# Patient Record
Sex: Female | Born: 1975
Health system: Southern US, Community
[De-identification: ages and names within clinical notes are randomized; demographics above are authoritative.]

## PROBLEM LIST (undated history)

## (undated) DIAGNOSIS — F319 Bipolar disorder, unspecified: Secondary | ICD-10-CM

## (undated) DIAGNOSIS — F411 Generalized anxiety disorder: Secondary | ICD-10-CM

## (undated) DIAGNOSIS — I10 Essential (primary) hypertension: Secondary | ICD-10-CM

## (undated) DIAGNOSIS — F332 Major depressive disorder, recurrent severe without psychotic features: Secondary | ICD-10-CM

## (undated) HISTORY — PX: DILATION AND CURETTAGE OF UTERUS: SHX78

## (undated) HISTORY — DX: Essential (primary) hypertension: I10

---
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
## 2011-09-12 ENCOUNTER — Emergency Department (HOSPITAL_COMMUNITY): Payer: Self-pay

## 2011-09-12 ENCOUNTER — Encounter (HOSPITAL_COMMUNITY): Payer: Self-pay | Admitting: *Deleted

## 2011-09-12 ENCOUNTER — Emergency Department (HOSPITAL_COMMUNITY)
Admission: EM | Admit: 2011-09-12 | Discharge: 2011-09-12 | Disposition: A | Discharge status: Home / Self Care | Payer: Self-pay | Attending: Emergency Medicine | Admitting: Emergency Medicine

## 2011-09-12 DIAGNOSIS — R0602 Shortness of breath: Secondary | ICD-10-CM | POA: Insufficient documentation

## 2011-09-12 DIAGNOSIS — R079 Chest pain, unspecified: Secondary | ICD-10-CM | POA: Insufficient documentation

## 2011-09-12 DIAGNOSIS — R05 Cough: Secondary | ICD-10-CM

## 2011-09-12 DIAGNOSIS — R059 Cough, unspecified: Secondary | ICD-10-CM | POA: Insufficient documentation

## 2011-09-12 MED ORDER — DIPHENHYDRAMINE HCL 25 MG PO CAPS
25.0000 mg | ORAL_CAPSULE | Freq: Once | ORAL | Status: AC
  Administered 2011-09-12: 25 mg via ORAL
  Filled 2011-09-12: qty 1

## 2011-09-12 MED ORDER — ALBUTEROL SULFATE (5 MG/ML) 0.5% IN NEBU
2.5000 mg | INHALATION_SOLUTION | Freq: Once | RESPIRATORY_TRACT | Status: AC
  Administered 2011-09-12: 2.5 mg via RESPIRATORY_TRACT
  Filled 2011-09-12: qty 0.5

## 2011-09-12 NOTE — ED Provider Notes (Signed)
History     CSN: [id number]  Arrival date & time 09/12/11  1343   First MD Initiated Contact with Patient 09/12/11 1356      Chief Complaint  Patient presents with  . Shortness of Breath  . Cough    (Consider location/radiation/quality/duration/timing/severity/associated sxs/prior treatment) HPI Comments: Brandy Vega is a 36 y.o. female who presents to the Emergency Department complaining of cough with yellow sputum and associated shortness of breath and chest pain that began last night. Denies fever, chills, nausea, vomiting, diarrhea.   No PCP  Patient is a 36 y.o. female presenting with shortness of breath and cough.  Shortness of Breath  Associated symptoms include cough and shortness of breath.  Cough Associated symptoms include shortness of breath.    No past medical history on file.  Past Surgical History  Procedure Date  . Dilation and curettage of uterus     No family history on file.  History  Substance Use Topics  . Smoking status: Current Everyday Smoker -- 0.5 packs/day    Types: Cigarettes  . Smokeless tobacco: Not on file  . Alcohol Use: Yes     occ. use    OB History    Grav Para Term Preterm Abortions TAB SAB Ect Mult Living                  Review of Systems  Respiratory: Positive for cough and shortness of breath.    10 Systems reviewed and are negative for acute change except as noted in the HPI. Allergies  Review of patient's allergies indicates no known allergies.  Home Medications  No current outpatient prescriptions on file.  BP 125/95  Pulse 97  Temp(Src) 98.3 F (36.8 C) (Oral)  Resp 24  Ht 5' 5.5" (1.664 m)  Wt 110 lb (49.896 kg)  BMI 18.03 kg/m2  SpO2 100%  LMP 09/05/2011  Physical Exam  Nursing note and vitals reviewed. Constitutional: She is oriented to person, place, and time. She appears well-developed and well-nourished.  HENT:  Head: Normocephalic and atraumatic.  Eyes: Conjunctivae and EOM are normal.  Pupils are equal, round, and reactive to light.  Neck: Normal range of motion. Neck supple.  Cardiovascular: Normal rate, normal heart sounds and intact distal pulses.   Pulmonary/Chest: Effort normal and breath sounds normal.       cough  Abdominal: Soft.  Musculoskeletal: Normal range of motion.  Neurological: She is alert and oriented to person, place, and time. She has normal reflexes.  Skin: Skin is dry.    ED Course  Procedures (including critical care time)  Labs Reviewed - No data to display Dg Chest 2 View  09/12/2011  *RADIOLOGY REPORT*  Clinical Data: Shortness of breath.  Cough.  CHEST - 2 VIEW  Comparison: None.  Findings: Heart size and vascularity are normal and the lungs are clear.  No effusions.  No osseous abnormality.  IMPRESSION: Normal chest.  Original Report Authenticated By: Gwynn Burly, M.D.     MDM  Patient with cough, shortness of breath and associated chest discomfort that began yesterday. Chest xray without acute findings. Given nebulizer treatment with relief.  Pt stable in ED with no significant deterioration in condition.The patient appears reasonably screened and/or stabilized for discharge and I doubt any other medical condition or other [facility] requiring further screening, evaluation, or treatment in the ED at this time prior to discharge.  MDM Reviewed: nursing note and vitals Interpretation: x-ray  Nicoletta Dress. Colon Branch, MD 09/12/11 [phone]

## 2011-09-12 NOTE — ED Notes (Signed)
Cough productive of clear/yellow phlegm since last night with sob per pt

## 2014-08-07 ENCOUNTER — Encounter (HOSPITAL_COMMUNITY): Payer: Self-pay | Admitting: *Deleted

## 2014-08-07 ENCOUNTER — Emergency Department (HOSPITAL_COMMUNITY)
Admission: EM | Admit: 2014-08-07 | Discharge: 2014-08-07 | Disposition: A | Discharge status: Home / Self Care | Payer: Self-pay | Attending: Emergency Medicine | Admitting: Emergency Medicine

## 2014-08-07 DIAGNOSIS — S81812A Laceration without foreign body, left lower leg, initial encounter: Secondary | ICD-10-CM

## 2014-08-07 DIAGNOSIS — Y9289 Other specified places as the place of occurrence of the external cause: Secondary | ICD-10-CM | POA: Insufficient documentation

## 2014-08-07 DIAGNOSIS — Y9389 Activity, other specified: Secondary | ICD-10-CM | POA: Insufficient documentation

## 2014-08-07 DIAGNOSIS — Z720 Tobacco use: Secondary | ICD-10-CM | POA: Insufficient documentation

## 2014-08-07 DIAGNOSIS — S71112A Laceration without foreign body, left thigh, initial encounter: Secondary | ICD-10-CM | POA: Insufficient documentation

## 2014-08-07 DIAGNOSIS — Y998 Other external cause status: Secondary | ICD-10-CM | POA: Insufficient documentation

## 2014-08-07 MED ORDER — LIDOCAINE HCL (PF) 1 % IJ SOLN
INTRAMUSCULAR | Status: AC
  Filled 2014-08-07: qty 5

## 2014-08-07 NOTE — ED Notes (Signed)
Brandy Plateravis, MD to bedside requesting lidocaine. On return to room,pt alone in room standing up again, trying to put her clothes on saying she is leaving. Continuing to reassure pt while awaiting EDP

## 2014-08-07 NOTE — ED Notes (Signed)
Pt admits to ETOH tonight "a lot" uncooperative with staff.

## 2014-08-07 NOTE — Discharge Instructions (Signed)
Get your staples taken out in one week, usually go to any urgent care, or emergency department  Stitches, Staples, or Skin Adhesive Strips  Stitches (sutures), staples, and skin adhesive strips hold the skin together as it heals. They will usually be in place for 7 days or less. HOME CARE  Wash your hands with soap and water before and after you touch your wound.  Only take medicine as told by your doctor.  Cover your wound only if your doctor told you to. Otherwise, leave it open to air.  Do not get your stitches wet or dirty. If they get dirty, dab them gently with a clean washcloth. Wet the washcloth with soapy water. Do not rub. Pat them dry gently.  Do not put medicine or medicated cream on your stitches unless your doctor told you to.  Do not take out your own stitches or staples. Skin adhesive strips will fall off by themselves.  Do not pick at the wound. Picking can cause an infection.  Do not miss your follow-up appointment.  If you have problems or questions, call your doctor. GET HELP RIGHT AWAY IF:   You have a temperature by mouth above 102 F (38.9 C), not controlled by medicine.  You have chills.  You have redness or pain around your stitches.  There is puffiness (swelling) around your stitches.  You notice fluid (drainage) from your stitches.  There is a bad smell coming from your wound. MAKE SURE YOU:  Understand these instructions.  Will watch your condition.  Will get help if you are not doing well or get worse. Document Released: 06/11/2009 Document Revised: 11/06/2011 Document Reviewed: 06/11/2009 [facility] Patient Information 2015 [employer] [place]. This information is not intended to replace advice given to you by your health care provider. Make sure you discuss any questions you have with your health care provider.

## 2014-08-07 NOTE — ED Notes (Signed)
Pt standing up, getting out of bed, saying she is leaving. Pt taking off monitoring equipment. Earlene Plateravis, MD called on phone and notified pt trying to leave. Reassuring pt
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
## 2014-08-07 NOTE — ED Notes (Signed)
After closure of wound, EDP remained at bedside with pt to discuss plan of care, including follow up information with pt. Pt says she is not happy because she wanted sutures and not staples, EDP offered to suture patient and remove staples, pt refused.  Pt agreed to have bandage placed.  Pt continues to be uncooperative with him and this rn. Pt says she is leaving, continuing to get dressed. EDP agrees pt will having to sign out AMA. Pt refuses to sign AMA form, charge nurse aware of the same. Pt ambulatory to discharge lobby.
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
## 2014-08-07 NOTE — ED Notes (Signed)
Confirmed with dr. Radford PaxBeaton no labs needed at this time.

## 2014-08-07 NOTE — ED Notes (Signed)
Earlene Plateravis, at bedside for staple application

## 2017-04-13 ENCOUNTER — Encounter: Payer: Self-pay | Admitting: Family Medicine

## 2017-04-13 ENCOUNTER — Ambulatory Visit (INDEPENDENT_AMBULATORY_CARE_PROVIDER_SITE_OTHER): Payer: Worker's Compensation | Admitting: Family Medicine

## 2017-04-13 VITALS — BP 130/89 | HR 83 | Temp 99.3°F | Ht 64.0 in | Wt 115.0 lb

## 2017-04-13 DIAGNOSIS — S63501A Unspecified sprain of right wrist, initial encounter: Secondary | ICD-10-CM | POA: Diagnosis not present

## 2017-04-13 NOTE — Progress Notes (Signed)
Subjective: CC: Work injury Date of injury: 04/06/17 Employer: [employer] PCP: Raliegh Ip, DO ZOX:[id number] Brandy Vega is a 41 y.o. female presenting to clinic today for:  1. Right wrist sprain Patient presents for recheck of her right wrist sprain for which she was initially seen at urgent care in [place] near [place]. She reports that she had a bundle of copper wire fall on the dorsum of her right wrist on 04/06/2017. She notes that at that time pain was a 10 out of 10. She had an x-ray performed at the urgent care center that was negative for any evidence of fracture. She was discharged with a wrist splint, meloxicam, and a muscle relaxer. She notes she used the wrist splint for about 3-4 days as directed. She reports that her pain has improved significantly and is a 3 out of 10 today. She notes she has slight discomfort with over pronation of the right wrist. Otherwise, she is pain-free. She reports she initially was unable to move her hand normally and that she felt like her hand had been stunned. Today she denies swelling, ecchymosis, erythema, crepitus, numbness or tingling.   She works at [employer]. 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 This involves a lot of upper extremity, hand, and wrist movement. She feels that she is able to return to work. She notes that she does not have to return until Monday.  No Known Allergies History reviewed. No pertinent past medical history. Family History  Problem Relation Age of Onset  . Hypertension Mother   . Diabetes Father    Social Hx: Active every day smoker.Current medications reviewed.   ROS: Per HPI  Objective: Office vital signs reviewed. BP 130/89   Pulse 83   Temp 99.3 F (37.4 C) (Oral)   Ht 5\' 4"  (1.626 m)   Wt 115 lb (52.2 kg)   BMI 19.74 kg/m   Physical Examination:  General: Awake, alert, well nourished, No acute distress Extremities: warm, well perfused, No edema, cyanosis or clubbing; +2 pulses  bilaterally. She has brisk capillary refill in all fingers.  Left wrist: No ecchymosis, no erythema. Skin is intact. She has full active range of motion. 5/5 strength.  Right wrist: No ecchymosis, no erythema. Skin is intact. No palpable bony abnormalities. She has about a 5% decrease in wrist flexion. She also has slight discomfort with over pronation of the right forearm. Otherwise she has full active, painless range of motion. No tenderness to palpation of the entire wrist and hand. She has 5/5 wrist strength. Skin: dry; intact; no rashes or lesions. Neuro: light touch sensation grossly intact, no focal neurologic deficits.  Assessment/ Plan: 41 y.o. female   Right wrist sprain, initial encounter Unfortunately, we were not able to obtain the records from the urgent care where she was seen initially. We're awaiting these records. However, right wrist pain improved. She continues to have about a 5% loss of range of motion in flexion and slight discomfort with over pronation of the right wrist. Otherwise she has full active range of motion in her wrist. There is no soft tissue swelling or skin changes. There are no bony abnormalities on exam. She has been cleared to return to work. No restrictions. We discussed that should her pain return or she develops swelling of that wrist, she should resume use of rigid wrist brace at nighttime and use a soft wrist brace during activity. She can continue her NSAID as needed as directed. I advised that she  avoid using the muscle relaxer unless she is expecting to stay home and sleep. Her worker's comp form has been completed and returned to her. Patient voiced good understanding of home care instructions and return precautions.  Follow-up with primary care provider as needed, as scheduled for routine care.  Raliegh Ip, DO [facility] [phone]

## 2017-04-13 NOTE — Patient Instructions (Signed)
You wrist looks has improved.  You can resume normal activities.  I recommend that you use your wrist brace, apply ice and take your antiinflammatory medication (Naproxen, Ibuprofen, Diclofenac, OR Meloxicam) if it becomes achy after work.  I suspect that you will recover well without these though.     Wrist Pain, Adult There are many things that can cause wrist pain. Some common causes include:  An injury to the wrist area.  Overuse of the joint.  A condition that causes too much pressure to be put on a nerve in the wrist (carpal tunnel syndrome).  Wear and tear of the joints that happens as a person gets older (osteoarthritis).  Other types of arthritis.  Sometimes, the cause of wrist pain is not known. Often, the pain goes away when you follow your doctor's instructions for helping pain at home, such as resting or icing your wrist. If your wrist pain does not go away, it is important to tell your doctor. Follow these instructions at home:  Rest the wrist area for 48 hours or more, or as long as told by your doctor.  If a splint or elastic bandage has been put on your wrist, use it as told by your doctor. ? Take off the splint or bandage only as told by your doctor. ? Loosen the splint or bandage if your fingers tingle, lose feeling (get numb), or turn cold or blue.  If directed, apply ice to the injured area: ? If you have a removable splint or elastic bandage, remove it as told by your doctor. ? Put ice in a plastic bag. ? Place a towel between your skin and the bag or between your splint or bandage and the bag. ? Leave the ice on for 20 minutes, 2-3 times a day.  Keep your arm raised (elevated) above the level of your heart while you are sitting or lying down.  Take over-the-counter and prescription medicines only as told by your doctor.  Keep all follow-up visits as told by your doctor. This is important. Contact a doctor if:  You have a sudden sharp pain in the wrist,  hand, or arm that is different or new.  The swelling or bruising on your wrist or hand gets worse.  Your skin becomes red, gets a rash, or has open sores.  Your pain does not get better or it gets worse. Get help right away if:  You lose feeling in your fingers or hand.  Your fingers turn white, very red, or cold and blue.  You cannot move your fingers.  You have a fever or chills. This information is not intended to replace advice given to you by your health care provider. Make sure you discuss any questions you have with your health care provider. Document Released: 01/31/2008 Document Revised: 03/09/2016 Document Reviewed: 03/02/2016 Elsevier Interactive Patient Education  2017 [employer].

## 2018-03-07 ENCOUNTER — Encounter: Payer: Self-pay | Admitting: Family Medicine

## 2018-03-07 ENCOUNTER — Ambulatory Visit (INDEPENDENT_AMBULATORY_CARE_PROVIDER_SITE_OTHER): Payer: BLUE CROSS/BLUE SHIELD | Admitting: Family Medicine

## 2018-03-07 VITALS — BP 151/94 | HR 78 | Temp 97.6°F | Ht 65.0 in | Wt 113.2 lb

## 2018-03-07 DIAGNOSIS — F339 Major depressive disorder, recurrent, unspecified: Secondary | ICD-10-CM | POA: Diagnosis not present

## 2018-03-07 MED ORDER — CITALOPRAM HYDROBROMIDE 20 MG PO TABS: 20.0000 mg | ORAL_TABLET | Freq: Every day | ORAL | 3 refills | Status: DC

## 2018-03-07 NOTE — Patient Instructions (Signed)
Great to meet you!  Start citalopram 1 pill once daily. Come back in 2 weeks to see myself or Dr. Nadine CountsGottschalk.   Call Behavioral health for an appointment today.   Your provider wants you to schedule an appointment with a Psychologist/Psychiatrist. The following list of offices requires the patient to call and make their own appointment, as there is information they need that only you can provide. Please feel free to choose form the following providers:  [facility]   [phone] [place] [phone]  [facility]  [phone] [address] [place], [place]

## 2018-03-07 NOTE — Progress Notes (Signed)
   HPI  Patient presents today depression.  Patient states that she is struggled with depression for the last year or so. She states that about a year ago she had suicidal thoughts and was considering attempting suicide so she called a suicide hotline.  She states that her mother is very involved and she is been talking to her lately.  Patient has had thoughts lately of being better off dead and wishing that she could go to bed and not wake up.  Patient states that she does not want to hurt her self currently and is not planning to hurt herself.  She contracts for safety stating that she will call 911 suicide hotline, or her mother if she has intent to hurt herself.  PMH: None Past surgical history: D&C Family medical history: Brother with schizophrenia Social history: Current smoker ROS: Per HPI  Objective: BP (!) 151/94   Pulse 78   Temp 97.6 F (36.4 C) (Oral)   Ht 5\' 5"  (1.651 m)   Wt 113 lb 3.2 oz (51.3 kg)   BMI 18.84 kg/m  Gen: NAD, alert, cooperative with exam HEENT: NCAT CV: RRR, good S1/S2, no murmur Resp: CTABL, no wheezes, non-labored Ext: No edema, warm Neuro: Alert and oriented, No gross deficits  Dep[facility] screen [facility] 03/07/2018 04/13/2017  Decreased Interest 3 0  Down, Depressed, Hopeless 3 0  PHQ - 2 Score 6 0  Altered sleeping 0 -  Tired, decreased energy 3 -  Change in appetite 2 -  Feeling bad or failure about yourself  0 -  Trouble concentrating 3 -  Moving slowly or fidgety/restless 3 -  Suicidal thoughts 2 -  PHQ-9 Score 19 -     Assessment and plan:  #Depression Severe, persistent over the last year. Start citalopram today Patient with suicidal thoughts but denies any suicidal plans.  She has never had a [place] attempt and has called the suicide hotline when she considered suicide about a year ago.  She readily contracts for safety when discussed I emphasized the importance of establishing care with psychiatry We discussed possible side  effects with citalopram, also discussed appropriate expectations for this medication Follow-up 2 weeks    Orders Placed This Encounter  Procedures  . Ambulatory referral to Psychiatry    Referral Priority:   Routine    Referral Type:   Psychiatric    Referral Reason:   Specialty Services Required    Requested Specialty:   Psychiatry    Number of Visits Requested:   1    Meds ordered this encounter  Medications  . citalopram (CELEXA) 20 MG tablet    Sig: Take 1 tablet (20 mg total) by mouth daily.    Dispense:  30 tablet    Refill:  3    Murtis SinkSam Kamariyah Timberlake, MD [facility] 03/07/2018, 11:50 AM

## 2018-03-08 ENCOUNTER — Telehealth (HOSPITAL_COMMUNITY): Payer: Self-pay

## 2018-03-08 ENCOUNTER — Telehealth: Payer: Self-pay

## 2018-03-08 NOTE — Telephone Encounter (Signed)
[facility] - Left Msg on the emergency contact number listed in epic.  Patient voice mail is not set up.

## 2018-03-11 DIAGNOSIS — Z0289 Encounter for other administrative examinations: Secondary | ICD-10-CM

## 2018-03-17 ENCOUNTER — Other Ambulatory Visit: Payer: Self-pay

## 2018-03-17 ENCOUNTER — Encounter (HOSPITAL_COMMUNITY): Payer: Self-pay | Admitting: Emergency Medicine

## 2018-03-17 ENCOUNTER — Emergency Department (HOSPITAL_COMMUNITY)
Admission: EM | Admit: 2018-03-17 | Discharge: 2018-03-18 | Disposition: A | Discharge status: Home / Self Care | Payer: BLUE CROSS/BLUE SHIELD | Attending: Emergency Medicine | Admitting: Emergency Medicine

## 2018-03-17 DIAGNOSIS — F17210 Nicotine dependence, cigarettes, uncomplicated: Secondary | ICD-10-CM | POA: Insufficient documentation

## 2018-03-17 DIAGNOSIS — F329 Major depressive disorder, single episode, unspecified: Secondary | ICD-10-CM | POA: Diagnosis present

## 2018-03-17 DIAGNOSIS — R45851 Suicidal ideations: Secondary | ICD-10-CM | POA: Diagnosis not present

## 2018-03-17 DIAGNOSIS — F1010 Alcohol abuse, uncomplicated: Secondary | ICD-10-CM | POA: Insufficient documentation

## 2018-03-17 DIAGNOSIS — Z79899 Other long term (current) drug therapy: Secondary | ICD-10-CM | POA: Diagnosis not present

## 2018-03-17 DIAGNOSIS — F332 Major depressive disorder, recurrent severe without psychotic features: Secondary | ICD-10-CM

## 2018-03-17 HISTORY — DX: Major depressive disorder, recurrent severe without psychotic features: F33.2

## 2018-03-17 LAB — COMPREHENSIVE METABOLIC PANEL
ALT: 15 U/L (ref 0–44)
AST: 30 U/L (ref 15–41)
Albumin: 4.4 g/dL (ref 3.5–5.0)
Alkaline Phosphatase: 56 U/L (ref 38–126)
Anion gap: 12 (ref 5–15)
BILIRUBIN TOTAL: 0.4 mg/dL (ref 0.3–1.2)
BUN: 7 mg/dL (ref 6–20)
CO2: 25 mmol/L (ref 22–32)
CREATININE: 0.68 mg/dL (ref 0.44–1.00)
Calcium: 8.5 mg/dL — ABNORMAL LOW (ref 8.9–10.3)
Chloride: 108 mmol/L (ref 98–111)
GLUCOSE: 86 mg/dL (ref 70–99)
Potassium: 3.3 mmol/L — ABNORMAL LOW (ref 3.5–5.1)
SODIUM: 145 mmol/L (ref 135–145)
Total Protein: 7.8 g/dL (ref 6.5–8.1)

## 2018-03-17 LAB — CBC
HEMATOCRIT: 39.9 % (ref 36.0–46.0)
HEMOGLOBIN: 13.6 g/dL (ref 12.0–15.0)
MCH: 30.2 pg (ref 26.0–34.0)
MCHC: 34.1 g/dL (ref 30.0–36.0)
MCV: 88.5 fL (ref 78.0–100.0)
Platelets: 217 10*3/uL (ref 150–400)
RBC: 4.51 MIL/uL (ref 3.87–5.11)
RDW: 13.3 % (ref 11.5–15.5)
WBC: 6.1 10*3/uL (ref 4.0–10.5)

## 2018-03-17 LAB — I-STAT BETA HCG BLOOD, ED (MC, WL, AP ONLY): I-stat hCG, quantitative: <5 m[IU]/mL (ref <5)

## 2018-03-17 LAB — RAPID URINE DRUG SCREEN, HOSP PERFORMED
AMPHETAMINES: NOT DETECTED
Benzodiazepines: NOT DETECTED
Cocaine: NOT DETECTED
OPIATES: NOT DETECTED
TETRAHYDROCANNABINOL: NOT DETECTED

## 2018-03-17 LAB — SALICYLATE LEVEL

## 2018-03-17 LAB — ACETAMINOPHEN LEVEL: Acetaminophen (Tylenol), Serum: <10 ug/mL — ABNORMAL LOW (ref 10–30)

## 2018-03-17 LAB — ETHANOL: Alcohol, Ethyl (B): 371 mg/dL (ref <10)

## 2018-03-17 MED ORDER — ACETAMINOPHEN 325 MG PO TABS: 650.0000 mg | ORAL_TABLET | ORAL | Status: DC | PRN

## 2018-03-17 MED ORDER — ZOLPIDEM TARTRATE 5 MG PO TABS: 5.0000 mg | ORAL_TABLET | Freq: Every evening | ORAL | Status: DC | PRN

## 2018-03-17 MED ORDER — VITAMIN B-1 100 MG PO TABS: 100.0000 mg | ORAL_TABLET | Freq: Every day | ORAL | Status: DC

## 2018-03-17 MED ORDER — LORAZEPAM 1 MG PO TABS: 0.0000 mg | ORAL_TABLET | Freq: Two times a day (BID) | ORAL | Status: DC

## 2018-03-17 MED ORDER — ONDANSETRON HCL 4 MG PO TABS: 4.0000 mg | ORAL_TABLET | Freq: Three times a day (TID) | ORAL | Status: DC | PRN

## 2018-03-17 MED ORDER — LORAZEPAM 2 MG/ML IJ SOLN: 0.0000 mg | Freq: Four times a day (QID) | INTRAMUSCULAR | Status: DC

## 2018-03-17 MED ORDER — LORAZEPAM 2 MG/ML IJ SOLN: 0.0000 mg | Freq: Two times a day (BID) | INTRAMUSCULAR | Status: DC

## 2018-03-17 MED ORDER — LORAZEPAM 1 MG PO TABS: 0.0000 mg | ORAL_TABLET | Freq: Four times a day (QID) | ORAL | Status: DC

## 2018-03-17 MED ORDER — THIAMINE HCL 100 MG/ML IJ SOLN: 100.0000 mg | Freq: Every day | INTRAMUSCULAR | Status: DC

## 2018-03-17 NOTE — ED Notes (Signed)
When lab went to draw pt's blood, pt began having jerking of body and staring to the left.  After blood obtained, shaking stopped and pt began talking with no postictal phase.  IV initiated for precautions and pt again began jerking in the same manner after saying "Aww lawd leave me the hell alone."  After IV started, pt stopped and continued to converse in a rambling manner.  No further jerking and no s/s of seizure.
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
## 2018-03-17 NOTE — ED Notes (Signed)
Attempt call for follow up to Ms. Brandy Vega, [facility].
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
## 2018-03-17 NOTE — ED Notes (Signed)
Date and time results received: 03/17/18 1610 (use smartphrase ".now" to insert current time)  Test: alcohol ethyl Critical Value: 371  Name of Provider Notified: Dr Rhunette CroftNanavati  Orders Received? Or Actions Taken?: none at this time

## 2018-03-18 ENCOUNTER — Inpatient Hospital Stay (HOSPITAL_COMMUNITY)
Admission: AD | Admit: 2018-03-18 | Discharge: 2018-03-22 | DRG: 885 | Disposition: A | Discharge status: Home / Self Care | Payer: BLUE CROSS/BLUE SHIELD | Source: Intra-hospital | Attending: Psychiatry | Admitting: Psychiatry

## 2018-03-18 ENCOUNTER — Encounter (HOSPITAL_COMMUNITY): Payer: Self-pay | Admitting: Psychiatry

## 2018-03-18 DIAGNOSIS — F1020 Alcohol dependence, uncomplicated: Secondary | ICD-10-CM | POA: Diagnosis present

## 2018-03-18 DIAGNOSIS — F17210 Nicotine dependence, cigarettes, uncomplicated: Secondary | ICD-10-CM | POA: Diagnosis present

## 2018-03-18 DIAGNOSIS — Z79899 Other long term (current) drug therapy: Secondary | ICD-10-CM

## 2018-03-18 DIAGNOSIS — F4310 Post-traumatic stress disorder, unspecified: Secondary | ICD-10-CM | POA: Diagnosis present

## 2018-03-18 DIAGNOSIS — F332 Major depressive disorder, recurrent severe without psychotic features: Secondary | ICD-10-CM | POA: Diagnosis present

## 2018-03-18 DIAGNOSIS — G4700 Insomnia, unspecified: Secondary | ICD-10-CM | POA: Diagnosis present

## 2018-03-18 DIAGNOSIS — R45851 Suicidal ideations: Secondary | ICD-10-CM | POA: Diagnosis present

## 2018-03-18 DIAGNOSIS — F419 Anxiety disorder, unspecified: Secondary | ICD-10-CM | POA: Diagnosis not present

## 2018-03-18 DIAGNOSIS — F411 Generalized anxiety disorder: Secondary | ICD-10-CM | POA: Diagnosis present

## 2018-03-18 DIAGNOSIS — Y908 Blood alcohol level of 240 mg/100 ml or more: Secondary | ICD-10-CM | POA: Diagnosis present

## 2018-03-18 DIAGNOSIS — F1011 Alcohol abuse, in remission: Secondary | ICD-10-CM

## 2018-03-18 HISTORY — DX: Major depressive disorder, recurrent severe without psychotic features: F33.2

## 2018-03-18 MED ORDER — LOPERAMIDE HCL 2 MG PO CAPS: 2.0000 mg | ORAL_CAPSULE | ORAL | Status: AC | PRN

## 2018-03-18 MED ORDER — ONDANSETRON 4 MG PO TBDP: 4.0000 mg | ORAL_TABLET | Freq: Four times a day (QID) | ORAL | Status: AC | PRN

## 2018-03-18 MED ORDER — ADULT MULTIVITAMIN W/MINERALS CH
1.0000 | ORAL_TABLET | Freq: Every day | ORAL | Status: DC
  Administered 2018-03-19 – 2018-03-22 (×4): 1 via ORAL
  Filled 2018-03-18 (×6): qty 1

## 2018-03-18 MED ORDER — POTASSIUM CHLORIDE CRYS ER 20 MEQ PO TBCR
20.0000 meq | EXTENDED_RELEASE_TABLET | Freq: Once | ORAL | Status: AC
  Administered 2018-03-18: 20 meq via ORAL
  Filled 2018-03-18 (×2): qty 1

## 2018-03-18 MED ORDER — CHLORDIAZEPOXIDE HCL 25 MG PO CAPS: 25.0000 mg | ORAL_CAPSULE | Freq: Every day | ORAL | Status: DC

## 2018-03-18 MED ORDER — CHLORDIAZEPOXIDE HCL 25 MG PO CAPS: 25.0000 mg | ORAL_CAPSULE | Freq: Four times a day (QID) | ORAL | Status: AC | PRN

## 2018-03-18 MED ORDER — CHLORDIAZEPOXIDE HCL 25 MG PO CAPS
25.0000 mg | ORAL_CAPSULE | Freq: Four times a day (QID) | ORAL | Status: AC
  Administered 2018-03-18 – 2018-03-20 (×6): 25 mg via ORAL
  Filled 2018-03-18 (×6): qty 1

## 2018-03-18 MED ORDER — CHLORDIAZEPOXIDE HCL 25 MG PO CAPS
25.0000 mg | ORAL_CAPSULE | ORAL | Status: AC
Start: 2018-03-21 — End: 2018-03-22
  Administered 2018-03-21 – 2018-03-22 (×2): 25 mg via ORAL
  Filled 2018-03-18 (×2): qty 1

## 2018-03-18 MED ORDER — VITAMIN B-1 100 MG PO TABS
100.0000 mg | ORAL_TABLET | Freq: Every day | ORAL | Status: DC
  Administered 2018-03-19 – 2018-03-22 (×4): 100 mg via ORAL
  Filled 2018-03-18 (×6): qty 1

## 2018-03-18 MED ORDER — MAGNESIUM HYDROXIDE 400 MG/5ML PO SUSP: 30.0000 mL | Freq: Every day | ORAL | Status: DC | PRN

## 2018-03-18 MED ORDER — HYDROXYZINE HCL 25 MG PO TABS: 25.0000 mg | ORAL_TABLET | Freq: Four times a day (QID) | ORAL | Status: DC | PRN

## 2018-03-18 MED ORDER — TRAZODONE HCL 50 MG PO TABS: 50.0000 mg | ORAL_TABLET | Freq: Every evening | ORAL | Status: DC | PRN

## 2018-03-18 MED ORDER — CHLORDIAZEPOXIDE HCL 25 MG PO CAPS
25.0000 mg | ORAL_CAPSULE | Freq: Three times a day (TID) | ORAL | Status: AC
Start: 2018-03-20 — End: 2018-03-21
  Administered 2018-03-20 – 2018-03-21 (×3): 25 mg via ORAL
  Filled 2018-03-18 (×3): qty 1

## 2018-03-18 MED ORDER — ALUM & MAG HYDROXIDE-SIMETH 200-200-20 MG/5ML PO SUSP: 30.0000 mL | ORAL | Status: DC | PRN

## 2018-03-18 NOTE — Progress Notes (Signed)
[facility]2 Fransisca KaufmannLaura Davis, NP is the accepting provider.  Dr.Gregory Jola BabinskiClary is the attending provider.  Call report to [phone]  Robin@ [facility] notified for pt's Nurse Arlys JohnBrian.   Pt is Voluntary.  Pt may be transported by [facility]  Pt scheduled  [facility] as soon as transport can be arranged.  Timmothy EulerJean T. Kaylyn LimSutter, MSW, LCSWA Disposition Clinical Social Work [phone] (cell) [phone] (office)

## 2018-03-18 NOTE — ED Notes (Signed)
[facility] Accepting is Fransisca KaufmannLaura Davis, NP Attending is Dr Doralee AlbinoGregory  Clary Call report [phone] Fax signed voluntary consent to [phone] Can come anytime
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
## 2018-03-19 ENCOUNTER — Encounter (HOSPITAL_COMMUNITY): Payer: Self-pay

## 2018-03-19 ENCOUNTER — Other Ambulatory Visit: Payer: Self-pay

## 2018-03-19 DIAGNOSIS — F332 Major depressive disorder, recurrent severe without psychotic features: Principal | ICD-10-CM

## 2018-03-19 DIAGNOSIS — F1020 Alcohol dependence, uncomplicated: Secondary | ICD-10-CM

## 2018-03-19 DIAGNOSIS — F1011 Alcohol abuse, in remission: Secondary | ICD-10-CM

## 2018-03-19 MED ORDER — CITALOPRAM HYDROBROMIDE 20 MG PO TABS: 20.0000 mg | ORAL_TABLET | Freq: Every day | ORAL | Status: DC

## 2018-03-19 MED ORDER — CITALOPRAM HYDROBROMIDE 20 MG PO TABS
20.0000 mg | ORAL_TABLET | Freq: Every day | ORAL | Status: DC
  Administered 2018-03-19 – 2018-03-22 (×4): 20 mg via ORAL
  Filled 2018-03-19 (×7): qty 1
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
## 2018-03-19 NOTE — Progress Notes (Signed)
D. Pt presents with a sad affect congruent with mood- calm and cooperative behavior. Pt observed interacting appropriately with peers in milieu and attending group. Per pt's self inventory, pt rates her depression, hopelessness and anxiety a 6/2/2, respectively. Pt writes that her most important goal to work on today is "depression". Pt currently denies SI/HI and AV hallucinations. A. Labs and vitals monitored. Pt compliant with medications. Pt supported emotionally and encouraged to express concerns and ask questions.   R. Pt remains safe with 15 minute checks. Will continue POC.

## 2018-03-19 NOTE — Progress Notes (Signed)
Pt attended AA group this evening.  
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
## 2018-03-19 NOTE — Progress Notes (Signed)
D: Patient observed in dayroom, social with peers. Playing games, interacting, working on puzzle. Patient states she has had a good day. Has minimal withdrawal - slight tremors - and CIWA is a "2". VSS. Patient's affect anxious with depressed and anxious mood.  Denies pain.   A: Medicated per orders, no prns requested or required. Medication education provided. Level III obs in place for safety. Emotional support offered. Patient encouraged to complete Suicide Safety Plan before discharge. Encouraged to attend and participate in unit programming.   R: Patient verbalizes understanding of POC. Patient denies SI/HI/AVH and remains safe on level III obs. Will continue to monitor throughout the night.

## 2018-03-19 NOTE — Plan of Care (Signed)
Patient engaging with peers in dayroom. Working on puzzles, playing games together.  Patient verbalizes understanding of information, education provided.
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
## 2018-03-19 NOTE — Progress Notes (Signed)
ADMISSION NOTE Pt is 42 yr old female who presents with depression and ETOH abuse with a BAL of 371.  Pt is depressed, sad and wanting help with substance abuse.  Pt sts on admission she was SI, but sts she is not thinking about it now.  Pt is very poor historian and forwards little and does not give clear answers.  Pt report given to previous RN.  Pt currently contracts for safety, verbally, wants to go to sleep and be left alone.  Notes on patient previous to admission at poor. Pt to room, report given. Pt in room sleeping.  Pt remains safe on unit

## 2018-03-19 NOTE — Progress Notes (Signed)
Adult Psychoeducational Group Note  Date:  03/19/2018 Time:  10:20 AM  Group Topic/Focus:  Goals Group:   The focus of this group is to help patients establish daily goals to achieve during treatment and discuss how the patient can incorporate goal setting into their daily lives to aide in recovery.  Participation Level:  Active  Participation Quality:  Appropriate  Affect:  Appropriate  Cognitive:  Appropriate  Insight: Appropriate  Engagement in Group:  Engaged  Modes of Intervention:  Activity and Discussion  Additional Comments:  Pt attended orientation/ goals group. Pt denies SI/Hi at this time. Pt did not set a goal for today. Pt was pleasant and appropriate in group.   Mikle Sternberg A 03/19/2018, 10:20 AM

## 2018-03-19 NOTE — Tx Team (Signed)
Initial Treatment Plan 03/19/2018 6:05 AM Brandy Vega EAV:[id number]    PATIENT STRESSORS: Financial difficulties Legal issue Substance abuse   PATIENT STRENGTHS: Average or above average intelligence Communication skills Supportive family/friends   PATIENT IDENTIFIED PROBLEMS: Substance Abuse "I drink too much"  Depression "I need better medications"                   DISCHARGE CRITERIA:  Ability to meet basic life and health needs Adequate post-discharge living arrangements Improved stabilization in mood, thinking, and/or behavior Medical problems require only outpatient monitoring  PRELIMINARY DISCHARGE PLAN: Attend 12-step recovery group Outpatient therapy Participate in family therapy Return to previous living arrangement  PATIENT/FAMILY INVOLVEMENT: This treatment plan has been presented to and reviewed with the patient, Brandy Vega.  The patient has been given the opportunity to ask questions and make suggestions.  Sylvan CheeseSteven M Pheobe Sandiford, RN 03/19/2018, 6:05 AM

## 2018-03-19 NOTE — [facility] Group Notes (Signed)
[facility] 03/19/2018 1:15pm  Type of Therapy: Mental Health Association Presentation  Participation Level: Active  Participation Quality: Attentive  Affect: Appropriate  Cognitive: Oriented  Insight: Developing/Improving  Engagement in Therapy: Engaged  Modes of Intervention: Discussion, Education and Socialization  Summary of Progress/Problems: Mental Health Association (MHA) Speaker came to talk about his personal journey with mental health. The pt processed ways by which to relate to the speaker. MHA speaker provided handouts and educational information pertaining to groups and services offered by the [facility]. Pt was engaged in speaker's presentation and was receptive to resources provided.    Rona RavensHeather S Seda Kronberg, LCSW 03/19/2018 2:57 PM

## 2018-03-19 NOTE — Progress Notes (Signed)
Recreation Therapy Notes  Animal-Assisted Activity (AAA) Program Checklist/Progress Notes Patient Eligibility Criteria Checklist & Daily Group note for Rec Tx Intervention  Date: 7.23.19 Time: 1430 Location: 300 Hall Group Room   AAA/T Program Assumption of Risk Form signed by Patient/ or Parent Legal Guardian YES   Patient is free of allergies or sever asthma YES   Patient reports no fear of animals YES  Patient reports no history of cruelty to animals YES   Patient understands his/her participation is voluntary YES   Patient washes hands before animal contact YES   Patient washes hands after animal contact YES   Behavioral Response: Engaged  Education: Charity fundraiserHand Washing, Appropriate Animal Interaction   Education Outcome: Acknowledges understanding/In group clarification offered/Needs additional education.   Clinical Observations/Feedback:  Pt attended and participated in activity.    Caroll RancherMarjette Jamail Cullers, LRT/CTRS         Caroll RancherLindsay, Tamantha Saline A 03/19/2018 3:37 PM

## 2018-03-20 DIAGNOSIS — F1020 Alcohol dependence, uncomplicated: Secondary | ICD-10-CM

## 2018-03-20 DIAGNOSIS — G4700 Insomnia, unspecified: Secondary | ICD-10-CM

## 2018-03-20 DIAGNOSIS — F17210 Nicotine dependence, cigarettes, uncomplicated: Secondary | ICD-10-CM

## 2018-03-20 DIAGNOSIS — F419 Anxiety disorder, unspecified: Secondary | ICD-10-CM

## 2018-03-20 DIAGNOSIS — Y908 Blood alcohol level of 240 mg/100 ml or more: Secondary | ICD-10-CM

## 2018-03-20 LAB — BASIC METABOLIC PANEL
ANION GAP: 9 (ref 5–15)
BUN: 8 mg/dL (ref 6–20)
CALCIUM: 9.6 mg/dL (ref 8.9–10.3)
CO2: 26 mmol/L (ref 22–32)
Chloride: 103 mmol/L (ref 98–111)
Creatinine, Ser: 0.67 mg/dL (ref 0.44–1.00)
GFR calc non Af Amer: >60 mL/min (ref >60)
Glucose, Bld: 104 mg/dL — ABNORMAL HIGH (ref 70–99)
POTASSIUM: 3.6 mmol/L (ref 3.5–5.1)
SODIUM: 138 mmol/L (ref 135–145)

## 2018-03-20 LAB — TSH: TSH: 4.105 u[IU]/mL (ref 0.350–4.500)

## 2018-03-20 NOTE — Tx Team (Signed)
Interdisciplinary Treatment and Diagnostic Plan Update  03/20/2018 Time of Session: 0830AM Brandy MewCynthia Vega MRN: [id number]  Principal Diagnosis: Alcohol use disorder, severe, dependence (HCC)  Secondary Diagnoses: Principal Problem:   Alcohol use disorder, severe, dependence (HCC) Active Problems:   MDD (major depressive disorder), recurrent episode, severe (HCC)   Current Medications:  Current Facility-Administered Medications  Medication Dose Route Frequency Provider Last Rate Last Dose  . alum & mag hydroxide-simeth (MAALOX/MYLANTA) 200-200-20 MG/5ML suspension 30 mL  30 mL Oral Q4H PRN Fransisca Kaufmannavis, Laura A, NP      . chlordiazePOXIDE (LIBRIUM) capsule 25 mg  25 mg Oral Q6H PRN Nira ConnBerry, Jason A, NP      . chlordiazePOXIDE (LIBRIUM) capsule 25 mg  25 mg Oral TID Jackelyn PolingBerry, Jason A, NP       Followed by  . [START ON 03/21/2018] chlordiazePOXIDE (LIBRIUM) capsule 25 mg  25 mg Oral BH-qamhs Jackelyn PolingBerry, Jason A, NP       Followed by  . [START ON 03/23/2018] chlordiazePOXIDE (LIBRIUM) capsule 25 mg  25 mg Oral Daily Nira ConnBerry, Jason A, NP      . citalopram (CELEXA) tablet 20 mg  20 mg Oral Daily Nwoko, Agnes I, NP   20 mg at 03/20/18 [id number]  . hydrOXYzine (ATARAX/VISTARIL) tablet 25 mg  25 mg Oral Q6H PRN Fransisca Kaufmannavis, Laura A, NP      . loperamide (IMODIUM) capsule 2-4 mg  2-4 mg Oral PRN Nira ConnBerry, Jason A, NP      . magnesium hydroxide (MILK OF MAGNESIA) suspension 30 mL  30 mL Oral Daily PRN Thermon Leylandavis, Laura A, NP      . multivitamin with minerals tablet 1 tablet  1 tablet Oral Daily Nira ConnBerry, Jason A, NP   1 tablet at 03/20/18 0738  . ondansetron (ZOFRAN-ODT) disintegrating tablet 4 mg  4 mg Oral Q6H PRN Nira ConnBerry, Jason A, NP      . thiamine (VITAMIN B-1) tablet 100 mg  100 mg Oral Daily Nira ConnBerry, Jason A, NP   100 mg at 03/20/18 0738  . traZODone (DESYREL) tablet 50 mg  50 mg Oral QHS PRN Thermon Leylandavis, Laura A, NP       PTA Medications: Medications Prior to Admission  Medication Sig Dispense Refill Last Dose  . citalopram (CELEXA) 20  MG tablet Take 1 tablet (20 mg total) by mouth daily. 30 tablet 3 03/16/2018 at Unknown time    Patient Stressors: Financial difficulties Legal issue Substance abuse  Patient Strengths: Average or above average intelligence Communication skills Supportive family/friends  Treatment Modalities: Medication Management, Group therapy, Case management,  1 to 1 session with clinician, Psychoeducation, Recreational therapy.   Physician Treatment Plan for Primary Diagnosis: Alcohol use disorder, severe, dependence (HCC) Long Term Goal(s): Improvement in symptoms so as ready for discharge Improvement in symptoms so as ready for discharge   Short Term Goals: Ability to identify changes in lifestyle to reduce recurrence of condition will improve Ability to demonstrate self-control will improve Ability to identify and develop effective coping behaviors will improve Compliance with prescribed medications will improve Ability to identify triggers associated with substance abuse/mental health issues will improve  Medication Management: Evaluate patient's response, side effects, and tolerance of medication regimen.  Therapeutic Interventions: 1 to 1 sessions, Unit Group sessions and Medication administration.  Evaluation of Outcomes: Progressing  Physician Treatment Plan for Secondary Diagnosis: Principal Problem:   Alcohol use disorder, severe, dependence (HCC) Active Problems:   MDD (major depressive disorder), recurrent episode, severe (HCC)  Long Term Goal(s): Improvement in  symptoms so as ready for discharge Improvement in symptoms so as ready for discharge   Short Term Goals: Ability to identify changes in lifestyle to reduce recurrence of condition will improve Ability to demonstrate self-control will improve Ability to identify and develop effective coping behaviors will improve Compliance with prescribed medications will improve Ability to identify triggers associated with substance  abuse/mental health issues will improve     Medication Management: Evaluate patient's response, side effects, and tolerance of medication regimen.  Therapeutic Interventions: 1 to 1 sessions, Unit Group sessions and Medication administration.  Evaluation of Outcomes: Progressing   RN Treatment Plan for Primary Diagnosis: Alcohol use disorder, severe, dependence (HCC) Long Term Goal(s): Knowledge of disease and therapeutic regimen to maintain health will improve  Short Term Goals: Ability to remain free from injury will improve, Ability to verbalize frustration and anger appropriately will improve, Ability to demonstrate self-control and Ability to disclose and discuss suicidal ideas  Medication Management: RN will administer medications as ordered by provider, will assess and evaluate patient's response and provide education to patient for prescribed medication. RN will report any adverse and/or side effects to prescribing provider.  Therapeutic Interventions: 1 on 1 counseling sessions, Psychoeducation, Medication administration, Evaluate responses to treatment, Monitor vital signs and CBGs as ordered, Perform/monitor CIWA, COWS, AIMS and Fall Risk screenings as ordered, Perform wound care treatments as ordered.  Evaluation of Outcomes: Progressing   LCSW Treatment Plan for Primary Diagnosis: Alcohol use disorder, severe, dependence (HCC) Long Term Goal(s): Safe transition to appropriate next level of care at discharge, Engage patient in therapeutic group addressing interpersonal concerns.  Short Term Goals: Engage patient in aftercare planning with referrals and resources, Facilitate acceptance of mental health diagnosis and concerns and Facilitate patient progression through stages of change regarding substance use diagnoses and concerns  Therapeutic Interventions: Assess for all discharge needs, 1 to 1 time with Social worker, Explore available resources and support systems, Assess for  adequacy in community support network, Educate family and significant other(s) on suicide prevention, Complete Psychosocial Assessment, Interpersonal group therapy.  Evaluation of Outcomes: Progressing   Progress in Treatment: Attending groups: Yes. Participating in groups: Yes. Taking medication as prescribed: Yes. Toleration medication: Yes. Family/Significant other contact made: No, will contact:  pt's mother for collateral information/to complete SPE. Patient understands diagnosis: Yes. Discussing patient identified problems/goals with staff: Yes. Medical problems stabilized or resolved: Yes. Denies suicidal/homicidal ideation: Yes. Issues/concerns per patient self-inventory: No. Other: n/a   New problem(s) identified: No, Describe:  n/a  New Short Term/Long Term Goal(s): detox, medication management for mood stabilization; elimination of SI thoughts; development of comprehensive mental wellness/sobriety plan.   Patient Goals:  "I need to get help for my drinking and get medication."  Discharge Plan or Barriers: CSW assessing. Pt plans to follow-up at [facility] and return home with boyfriend at discharge. MHAG pamphlet, Mobile Crisis information, and AA/NA information provided to patient for additional community support and resources.   Reason for Continuation of Hospitalization: Anxiety Depression Medication stabilization Withdrawal symptoms  Estimated Length of Stay: Friday, 03/22/18  Attendees: Patient: 03/20/2018 9:01 AM  Physician: Dr. Viviano Simas MD; Dr. Jama Flavors MD  03/20/2018 9:01 AM  Nursing: Arlyss Repress RN; Roni RN 03/20/2018 9:01 AM  RN Care Manager:x 03/20/2018 9:01 AM  Social Worker: Corrie Mckusick LCSW 03/20/2018 9:01 AM  Recreational Therapist: x 03/20/2018 9:01 AM  Other: Armandina Stammer NP; Renold Don NP 03/20/2018 9:01 AM  Other:  03/20/2018 9:01 AM  Other: 03/20/2018 9:01 AM  Scribe for Treatment Team: Rona Ravens, LCSW 03/20/2018 9:01 AM

## 2018-03-20 NOTE — Plan of Care (Signed)
  Problem: Education: Goal: Emotional status will improve Outcome: Progressing Goal: Mental status will improve Outcome: Progressing   Problem: Activity: Goal: Interest or engagement in activities will improve Outcome: Progressing Goal: Sleeping patterns will improve Outcome: Progressing   Problem: Coping: Goal: Ability to verbalize frustrations and anger appropriately will improve Outcome: Progressing Goal: Ability to demonstrate self-control will improve Outcome: Progressing   Problem: Health Behavior/Discharge Planning: Goal: Identification of resources available to assist in meeting health care needs will improve Outcome: Progressing Goal: Compliance with treatment plan for underlying cause of condition will improve Outcome: Progressing   Problem: Physical Regulation: Goal: Ability to maintain clinical measurements within normal limits will improve Outcome: Progressing   Problem: Safety: Goal: Periods of time without injury will increase Outcome: Progressing   Problem: Education: Goal: Knowledge of disease or condition will improve Outcome: Progressing Goal: Understanding of discharge needs will improve Outcome: Progressing   Problem: Health Behavior/Discharge Planning: Goal: Ability to identify changes in lifestyle to reduce recurrence of condition will improve Outcome: Progressing   Problem: Coping: Goal: Coping ability will improve Outcome: Progressing   Problem: Safety: Goal: Ability to identify and utilize support systems that promote safety will improve Outcome: Progressing   Problem: Education: Goal: Ability to make informed decisions regarding treatment will improve Outcome: Progressing   Problem: Coping: Goal: Coping ability will improve Outcome: Progressing   Problem: Health Behavior/Discharge Planning: Goal: Identification of resources available to assist in meeting health care needs will improve Outcome: Progressing   Problem:  Medication: Goal: Compliance with prescribed medication regimen will improve Outcome: Progressing   Problem: Self-Concept: Goal: Ability to disclose and discuss suicidal ideas will improve Outcome: Progressing Goal: Will verbalize positive feelings about self Outcome: Progressing   

## 2018-03-20 NOTE — [facility] Group Notes (Signed)
LCSW Group Therapy Note   03/20/2018 1:15pm   Type of Therapy and Topic:  Group Therapy:  Overcoming Obstacles   Participation Level:  Did Not Attend--pt invited. Chose to remain in bed.    Description of Group:    In this group patients will be encouraged to explore what they see as obstacles to their own wellness and recovery. They will be guided to discuss their thoughts, feelings, and behaviors related to these obstacles. The group will process together ways to cope with barriers, with attention given to specific choices patients can make. Each patient will be challenged to identify changes they are motivated to make in order to overcome their obstacles. This group will be process-oriented, with patients participating in exploration of their own experiences as well as giving and receiving support and challenge from other group members.   Therapeutic Goals: 1. Patient will identify personal and current obstacles as they relate to admission. 2. Patient will identify barriers that currently interfere with their wellness or overcoming obstacles.  3. Patient will identify feelings, thought process and behaviors related to these barriers. 4. Patient will identify two changes they are willing to make to overcome these obstacles:      Summary of Patient Progress   x   Therapeutic Modalities:   Cognitive Behavioral Therapy Solution Focused Therapy Motivational Interviewing Relapse Prevention Therapy  Rona RavensHeather S Jaielle Dlouhy, LCSW 03/20/2018 2:47 PM

## 2018-03-20 NOTE — Progress Notes (Signed)
Recreation Therapy Notes  Date: 7.24.19 Time: 0930 Location: 300 Hall Dayroom  Group Topic: Stress Management  Goal Area(s) Addresses:  Patient will verbalize importance of using healthy stress management.  Patient will identify positive emotions associated with healthy stress management.   Behavioral Response: Engaged  Intervention: Stress Management  Activity : Meditation.  LRT introduced the stress management technique of meditation.  LRT played a meditation that focused on choices.  Patients were to follow along as meditation was read to fully engage in the activity.  Education:  Stress Management, Discharge Planning.   Education Outcome: Acknowledges edcuation/In group clarification offered/Needs additional education  Clinical Observations/Feedback: Pt attended and participated in activity.    Caroll RancherMarjette Aurelie Dicenzo, LRT/CTRS         Lillia AbedLindsay, Lasonya Hubner A 03/20/2018 10:45 AM

## 2018-03-20 NOTE — [facility] Suicide Risk Assessment (Signed)
[facility] INPATIENT:  Family/Significant Other Suicide Prevention Education  Suicide Prevention Education:  Education Completed; Herbert Deanerriscilla Blankenhorn (pt's mother) [phone] has been identified by the patient as the family member/significant other with whom the patient will be residing, and identified as the person(s) who will aid the patient in the event of a mental health crisis (suicidal ideations/suicide attempt).  With written consent from the patient, the family member/significant other has been provided the following suicide prevention education, prior to the and/or following the discharge of the patient.  The suicide prevention education provided includes the following:  Suicide risk factors  Suicide prevention and interventions  National Suicide Hotline telephone number  [facility] telephone number  [facility] Assistance 911  [facility] and/or Residential Mobile Crisis Unit telephone number  Request made of family/significant other to:  Remove weapons (e.g., guns, rifles, knives), all items previously/currently identified as safety concern.    Remove drugs/medications (over-the-counter, prescriptions, illicit drugs), all items previously/currently identified as a safety concern.  The family member/significant other verbalizes understanding of the suicide prevention education information provided.  The family member/significant other agrees to remove the items of safety concern listed above.  SPE and aftercare reviewed with pt's mother. Pt's mother states that she will try to help provide transportation to AA meetings when pt discharges. Pt's mother shared that pt called her this morning and sounded sleepy but less depressed and is comfortable with a possible discharge of Friday. Pt's mother confirmed that pt does not have access to weapons/firearms.   Rona RavensHeather S Cayman Brogden LCSW 03/20/2018, 11:10 AM

## 2018-03-20 NOTE — Therapy (Signed)
Occupational Therapy Group Note  Date:  03/20/2018 Time:  12:53 PM  Group Topic/Focus:  Stress Management  Participation Level:  Active  Participation Quality:  Appropriate  Affect:  Appropriate  Cognitive:  Appropriate  Insight: Improving  Engagement in Group:  Engaged  Modes of Intervention:  Activity, Discussion, Education and Socialization  Additional Comments:    S: "I like to go for walks"   O: Education given on stress management and healthy coping mechanisms. Pt encouraged to brainstorm with other peers and discuss what has worked in the past vs what has not. Pts further encouraged to discuss new coping stress management strategies to implement this date. Art activity made to display preferred coping mechanisms, along with incorporating the stress management outlet of coloring/art. Adult coloring sheets administered at end of session.  A: Pt presents to group with appropriate affect, engaged and participatory throughout session. Pt brainstormed activities with other peers, stating being outdoors, walking, and yoga are her preferred stress management coping mechanisms this date. Pt engaged in art activity with success and was eager to engaged with other peers in regards to finalized product. Pt states she really enjoys the positive atmosphere and [facility]   P: Pt provided with education on stress management activities to implement into daily routine. Handouts given to facilitate carryover when reintegrating into community     [facility] East[facility]lee Brycelyn Gambino, [place] York[place], OTR/L  AvnetKaylee Tonee Silverstein 03/20/2018, 12:53 PM

## 2018-03-20 NOTE — Progress Notes (Addendum)
[facility] MD Progress Note  03/20/2018 9:56 AM Brandy Vega  MRN:  [id number]  Subjective: Brandy Vega reports, "I'm doing & feeling much better now that my body is getting rid of the excessive alcohol. I slept well last night. My mood is improving. I don't have much anxiety today. The withdrawal symptoms are not as bad today. The group sessions are helping me. I will still like to consider outpatient substance abuse treatment after discharge because I have a job".  42 year old female, no children, employed, lives with BF. Presented to hospital due to worsening depression, suicidal ideations- describes as passive, without specific plan or intention,increased anxiety with frequent panic attacks. Reports alcohol use disorder , and states she has been drinking on most days, usually about 4 " coolers". Denies drug abuse. Admission ( 7/21)  BAL 371, Admission UDS negative. Reports she had recently been prescribed  Celexa, about 2-3 weeks ago, but had not been taking regularly. No prior psychiatric admissions, no history of suicide attempts of self injurious behaviors, no history of psychosis, endorses history of depression, anxiety (described mostly as panic attacks, without clear agoraphobia), alcohol use disorder . Denies medical illnesses, NKDA. Smokes 1/2 PPD.  Brandy Vega is seen. Chart reviewed. The chart findings discussed with the treatment team. She presents alert, oriented x 3 with an improved affect. She is visible on the unit, attending group session. She reports improved symptoms. She says the group sessions are quite helpful to her. She denies any medical complaints. She denies any SIHI, AVH, delusional thoughts or paranoia. She does not appear to be responding to any internal stimuli. Brandy Vega has agreed to continue her current plan of care already in progress. She is tolerating her treatment regimen.  Principal Problem: Alcohol use disorder, severe, dependence ([place])  Diagnosis:   Patient Active Problem  List   Diagnosis Date Noted  . Alcohol use disorder, severe, dependence ([place]) [F10.20] 03/19/2018  . MDD (major depressive disorder), recurrent episode, severe ([place]) [F33.2] 03/18/2018   Total Time spent with patient: 25 minutes  Past Psychiatric History: Alcoholism, MDD  Past Medical History:  Past Medical History:  Diagnosis Date  . MDD (major depressive disorder), recurrent episode, severe ([place]) 03/18/2018    Past Surgical History:  Procedure Laterality Date  . DILATION AND CURETTAGE OF UTERUS     Family History:  Family History  Problem Relation Age of Onset  . Hypertension Mother   . Diabetes Father    Family Psychiatric  History: See H&P  Social History:  Social History   Substance and Sexual Activity  Alcohol Use Yes   Comment: occ. use     Social History   Substance and Sexual Activity  Drug Use No    Social History   Socioeconomic History  . Marital status: Single    Spouse name: Not on file  . Number of children: Not on file  . Years of education: Not on file  . Highest education level: Not on file  Occupational History  . Not on file  Social Needs  . Financial resource strain: Not on file  . Food insecurity:    Worry: Not on file    Inability: Not on file  . Transportation needs:    Medical: Not on file    Non-medical: Not on file  Tobacco Use  . Smoking status: Current Every Day Smoker    Packs/day: 0.50    Types: Cigarettes  . Smokeless tobacco: Never Used  Substance and Sexual Activity  . Alcohol  use: Yes    Comment: occ. use  . Drug use: No  . Sexual activity: Yes    Birth control/protection: None  Lifestyle  . Physical activity:    Days per week: Not on file    Minutes per session: Not on file  . Stress: Not on file  Relationships  . Social connections:    Talks on phone: Not on file    Gets together: Not on file    Attends religious service: Not on file    Active member of club or organization: Not on file    Attends  meetings of clubs or organizations: Not on file    Relationship status: Not on file  Other Topics Concern  . Not on file  Social History Narrative  . Not on file   Additional Social History:  Prescriptions: SEE MAR History of alcohol / drug use?: Yes Longest period of sobriety (when/how long): UNKNOWN Negative Consequences of Use: Financial, Legal, Work / School Withdrawal Symptoms: Cramps, Diarrhea  Sleep: Fair  Appetite:  Fair  Current Medications: Current Facility-Administered Medications  Medication Dose Route Frequency Provider Last Rate Last Dose  . alum & mag hydroxide-simeth (MAALOX/MYLANTA) 200-200-20 MG/5ML suspension 30 mL  30 mL Oral Q4H PRN Elmarie Shiley A, NP      . chlordiazePOXIDE (LIBRIUM) capsule 25 mg  25 mg Oral Q6H PRN Lindon Romp A, NP      . chlordiazePOXIDE (LIBRIUM) capsule 25 mg  25 mg Oral TID Rozetta Nunnery, NP       Followed by  . [START ON 03/21/2018] chlordiazePOXIDE (LIBRIUM) capsule 25 mg  25 mg Oral BH-qamhs Rozetta Nunnery, NP       Followed by  . [START ON 03/23/2018] chlordiazePOXIDE (LIBRIUM) capsule 25 mg  25 mg Oral Daily Lindon Romp A, NP      . citalopram (CELEXA) tablet 20 mg  20 mg Oral Daily Nwoko, Agnes I, NP   20 mg at 03/20/18 7169  . hydrOXYzine (ATARAX/VISTARIL) tablet 25 mg  25 mg Oral Q6H PRN Elmarie Shiley A, NP      . loperamide (IMODIUM) capsule 2-4 mg  2-4 mg Oral PRN Lindon Romp A, NP      . magnesium hydroxide (MILK OF MAGNESIA) suspension 30 mL  30 mL Oral Daily PRN Niel Hummer, NP      . multivitamin with minerals tablet 1 tablet  1 tablet Oral Daily Lindon Romp A, NP   1 tablet at 03/20/18 0738  . ondansetron (ZOFRAN-ODT) disintegrating tablet 4 mg  4 mg Oral Q6H PRN Lindon Romp A, NP      . thiamine (VITAMIN B-1) tablet 100 mg  100 mg Oral Daily Lindon Romp A, NP   100 mg at 03/20/18 0738  . traZODone (DESYREL) tablet 50 mg  50 mg Oral QHS PRN Niel Hummer, NP       Lab Results:  Results for orders placed or  performed during the hospital encounter of 03/18/18 (from the past 48 hour(s))  TSH     Status: None   Collection Time: 03/20/18  6:23 AM  Result Value Ref Range   TSH 4.105 0.350 - 4.500 uIU/mL    Comment: Performed by a 3rd Generation assay with a functional sensitivity of <=0.01 uIU/mL. Performed at [facility], [place] [address] [place], [place] [address]   Basic metabolic panel     Status: Abnormal   Collection Time: 03/20/18  6:23 AM  Result Value  Ref Range   Sodium 138 135 - 145 mmol/L   Potassium 3.6 3.5 - 5.1 mmol/L   Chloride 103 98 - 111 mmol/L   CO2 26 22 - 32 mmol/L   Glucose, Bld 104 (H) 70 - 99 mg/dL   BUN 8 6 - 20 mg/dL   Creatinine, Ser 0.67 0.44 - 1.00 mg/dL   Calcium 9.6 8.9 - 10.3 mg/dL   GFR calc non Af Amer >60 >60 mL/min   GFR calc Af Amer >60 >60 mL/min    Comment: (NOTE) The eGFR has been calculated using the CKD EPI equation. This calculation has not been validated in all clinical situations. eGFR's persistently <60 mL/min signify possible Chronic Kidney Disease.    Anion gap 9 5 - 15    Comment: Performed at [facility], [place] [address] [place], [place] [address]   Blood Alcohol level:  Lab Results  Component Value Date   ETH 371 [facility]) 46/11/7996   Metabolic Disorder Labs: No results found for: HGBA1C, MPG No results found for: PROLACTIN No results found for: CHOL, TRIG, HDL, CHOLHDL, VLDL, LDLCALC  Physical Findings: AIMS: Facial and Oral Movements Muscles of Facial Expression: None, normal Lips and Perioral Area: None, normal Jaw: None, normal Tongue: None, normal,Extremity Movements Upper (arms, wrists, hands, fingers): None, normal Lower (legs, knees, ankles, toes): None, normal, Trunk Movements Neck, shoulders, hips: None, normal, Overall Severity Severity of abnormal movements (highest score from questions above): None, normal Incapacitation due to abnormal movements: None, normal Patient's  awareness of abnormal movements (rate only patient's report): No Awareness, Dental Status Current problems with teeth and/or dentures?: No Does patient usually wear dentures?: No  CIWA:  CIWA-Ar Total: 0 COWS:     Musculoskeletal: Strength & Muscle Tone: within normal limits Gait & Station: normal Patient leans: N/A  Psychiatric Specialty Exam: Physical Exam  Nursing note and vitals reviewed.   Review of Systems  Psychiatric/Behavioral: Positive for depression and substance abuse (Hx. alcoholism, chronic). Negative for hallucinations, memory loss and suicidal ideas. The patient has insomnia. The patient is not nervous/anxious.     Blood pressure 113/84, pulse 68, temperature 98.5 F (36.9 C), temperature source Oral, resp. rate 16, height 5' 5"  (1.651 m), weight 49.4 kg (109 lb), last menstrual period 03/17/2018, SpO2 97 %.Body mass index is 18.14 kg/m.  General Appearance: Fairly Groomed  Eye Contact:  Good  Speech:  Normal Rate  Volume:  Decreased  Mood:  Anxious and Depressed  Affect:  constricted   Thought Process:  Linear and Descriptions of Associations: Intact  Orientation:  Full (Time, Place, and Person)  Thought Content:  no hallucinations, no delusions, not internally preoccupied   Suicidal Thoughts:  No denies suicidal or self injurious ideations at this time, contracts for safety on unit , denies HI  Homicidal Thoughts:  No  Memory:  recent and remote grossly intact   Judgement:  Fair  Insight:  Fair  Psychomotor Activity:  Normal- no significant symptoms of alcohol WDL or psychomotor agitation at this time  Concentration:  Concentration: Good and Attention Span: Good  Recall:  Good  Fund of Knowledge:  Good  Language:  Good  Akathisia:  No  Handed:  Right  AIMS (if indicated):     Assets:  Communication Skills Desire for Improvement Resilience  ADL's:  Intact  Cognition:  WNL  Sleep:  Number of Hours: 5.25     Treatment Plan Summary: Daily contact with  patient to assess and evaluate symptoms  and progress in treatment.  - Continue inpatient hospitalization.  - Will continue today 03/20/2018 plan as below except where it is noted.  Alcohol detox.     - Continue the librium detoxification protocols as already in progress.  Depression.     - Continue Citalopram 20 mg po daily.  Anxiety.     - Continue Hydroxyzine  25 mg Q 6 hours prn.  Insomnia.     - Continue Trazodone 50 mg po Q hs prn.  Patient to attend & participate in the group session.  Discharge disposition ongoing.  Lindell Spar, NP, PMHNP, FNP-BC. 03/20/2018, 9:56 AM  ..Agree with NP Progress Note

## 2018-03-20 NOTE — Progress Notes (Signed)
Patient was pleasant and cooperative upon approach. Patient is not displaying any withdrawal symptoms. Denies pain, denies SI HI AVH.  Patient compliant with medications prescribed per provider. Med education provided. Support and encouragement given.  Patient safety maintained with 15 minute checks as well as environmental checks. Will continue to monitor.

## 2018-03-21 NOTE — Progress Notes (Signed)
[facility] MD Progress Note  03/21/2018 5:26 PM Brandy Vega  MRN:  [id number]  42 year old female, no children, employed, lives with BF. Presented to hospital due to worsening depression, suicidal ideations- describes as passive, without specific plan or intention,increased anxiety with frequent panic attacks. Reports alcohol use disorder , and states she has been drinking on most days, usually about 4 " coolers". Denies drug abuse. Admission ( 7/21)  BAL 371, Admission UDS negative. Reports she had recently been prescribed  Celexa, about 2-3 weeks ago, but had not been taking regularly. No prior psychiatric admissions, no history of suicide attempts of self injurious behaviors, no history of psychosis, endorses history of depression, anxiety (described mostly as panic attacks, without clear agoraphobia), alcohol use disorder . Denies medical illnesses, NKDA. Smokes 1/2 PPD.  Brandy Vega is seen. Chart reviewed. The chart findings discussed with the treatment team. She presents alert, oriented x 3 with an improved affect. She states that she is intending to stop suing alcohol and that her boyfriend has agreed to quit with her.  She is hoping to be able to attend a residential program, and will attend AA groups while awaiting a bed. She is visible on the unit, attending group session. She reports improved symptoms. She says the group sessions are quite helpful to her. She denies any medical complaints. She denies any SIHI, AVH, delusional thoughts or paranoia. She does not appear to be responding to any internal stimuli. Brandy Vega has agreed to continue her current plan of care already in progress. She is tolerating her treatment regimen.  Principal Problem: Alcohol use disorder, severe, dependence ([place])  Diagnosis:   Patient Active Problem List   Diagnosis Date Noted  . Alcohol use disorder, severe, dependence ([place]) [F10.20] 03/19/2018  . MDD (major depressive disorder), recurrent episode, severe ([place]) [F33.2]  03/18/2018   Total Time spent with patient: 25 minutes  Past Psychiatric History: Alcoholism, MDD  Past Medical History:  Past Medical History:  Diagnosis Date  . MDD (major depressive disorder), recurrent episode, severe ([place]) 03/18/2018    Past Surgical History:  Procedure Laterality Date  . DILATION AND CURETTAGE OF UTERUS     Family History:  Family History  Problem Relation Age of Onset  . Hypertension Mother   . Diabetes Father    Family Psychiatric  History: See H&P  Social History:  Social History   Substance and Sexual Activity  Alcohol Use Yes   Comment: occ. use     Social History   Substance and Sexual Activity  Drug Use No    Social History   Socioeconomic History  . Marital status: Single    Spouse name: Not on file  . Number of children: Not on file  . Years of education: Not on file  . Highest education level: Not on file  Occupational History  . Not on file  Social Needs  . Financial resource strain: Not on file  . Food insecurity:    Worry: Not on file    Inability: Not on file  . Transportation needs:    Medical: Not on file    Non-medical: Not on file  Tobacco Use  . Smoking status: Current Every Day Smoker    Packs/day: 0.50    Types: Cigarettes  . Smokeless tobacco: Never Used  Substance and Sexual Activity  . Alcohol use: Yes    Comment: occ. use  . Drug use: No  . Sexual activity: Yes    Birth control/protection: None  Lifestyle  .  Physical activity:    Days per week: Not on file    Minutes per session: Not on file  . Stress: Not on file  Relationships  . Social connections:    Talks on phone: Not on file    Gets together: Not on file    Attends religious service: Not on file    Active member of club or organization: Not on file    Attends meetings of clubs or organizations: Not on file    Relationship status: Not on file  Other Topics Concern  . Not on file  Social History Narrative  . Not on file   Additional  Social History:  Prescriptions: SEE MAR History of alcohol / drug use?: Yes Longest period of sobriety (when/how long): UNKNOWN Negative Consequences of Use: Financial, Legal, Work / School Withdrawal Symptoms: Cramps, Diarrhea  Sleep: Fair  Appetite:  Fair  Current Medications: Current Facility-Administered Medications  Medication Dose Route Frequency Provider Last Rate Last Dose  . alum & mag hydroxide-simeth (MAALOX/MYLANTA) 200-200-20 MG/5ML suspension 30 mL  30 mL Oral Q4H PRN Elmarie Shiley A, NP      . chlordiazePOXIDE (LIBRIUM) capsule 25 mg  25 mg Oral Q6H PRN Lindon Romp A, NP      . chlordiazePOXIDE (LIBRIUM) capsule 25 mg  25 mg Oral BH-qamhs Rozetta Nunnery, NP       Followed by  . [START ON 03/23/2018] chlordiazePOXIDE (LIBRIUM) capsule 25 mg  25 mg Oral Daily Lindon Romp A, NP      . citalopram (CELEXA) tablet 20 mg  20 mg Oral Daily Nwoko, Agnes I, NP   20 mg at 03/21/18 6269  . hydrOXYzine (ATARAX/VISTARIL) tablet 25 mg  25 mg Oral Q6H PRN Elmarie Shiley A, NP      . loperamide (IMODIUM) capsule 2-4 mg  2-4 mg Oral PRN Lindon Romp A, NP      . magnesium hydroxide (MILK OF MAGNESIA) suspension 30 mL  30 mL Oral Daily PRN Niel Hummer, NP      . multivitamin with minerals tablet 1 tablet  1 tablet Oral Daily Lindon Romp A, NP   1 tablet at 03/21/18 0737  . ondansetron (ZOFRAN-ODT) disintegrating tablet 4 mg  4 mg Oral Q6H PRN Lindon Romp A, NP      . thiamine (VITAMIN B-1) tablet 100 mg  100 mg Oral Daily Lindon Romp A, NP   100 mg at 03/21/18 0737  . traZODone (DESYREL) tablet 50 mg  50 mg Oral QHS PRN Niel Hummer, NP       Lab Results:  Results for orders placed or performed during the hospital encounter of 03/18/18 (from the past 48 hour(s))  TSH     Status: None   Collection Time: 03/20/18  6:23 AM  Result Value Ref Range   TSH 4.105 0.350 - 4.500 uIU/mL    Comment: Performed by a 3rd Generation assay with a functional sensitivity of <=0.01 uIU/mL. Performed  at [facility], [place] [address] [place], [place] [address]   Basic metabolic panel     Status: Abnormal   Collection Time: 03/20/18  6:23 AM  Result Value Ref Range   Sodium 138 135 - 145 mmol/L   Potassium 3.6 3.5 - 5.1 mmol/L   Chloride 103 98 - 111 mmol/L   CO2 26 22 - 32 mmol/L   Glucose, Bld 104 (H) 70 - 99 mg/dL   BUN 8 6 - 20 mg/dL   Creatinine,  Ser 0.67 0.44 - 1.00 mg/dL   Calcium 9.6 8.9 - 10.3 mg/dL   GFR calc non Af Amer >60 >60 mL/min   GFR calc Af Amer >60 >60 mL/min    Comment: (NOTE) The eGFR has been calculated using the CKD EPI equation. This calculation has not been validated in all clinical situations. eGFR's persistently <60 mL/min signify possible Chronic Kidney Disease.    Anion gap 9 5 - 15    Comment: Performed at [facility], [place] [address], [place], [place] [address]   Blood Alcohol level:  Lab Results  Component Value Date   ETH 371 [facility]) 69/67/8938   Metabolic Disorder Labs: No results found for: HGBA1C, MPG No results found for: PROLACTIN No results found for: CHOL, TRIG, HDL, CHOLHDL, VLDL, LDLCALC  Physical Findings: AIMS: Facial and Oral Movements Muscles of Facial Expression: None, normal Lips and Perioral Area: None, normal Jaw: None, normal Tongue: None, normal,Extremity Movements Upper (arms, wrists, hands, fingers): None, normal Lower (legs, knees, ankles, toes): None, normal, Trunk Movements Neck, shoulders, hips: None, normal, Overall Severity Severity of abnormal movements (highest score from questions above): None, normal Incapacitation due to abnormal movements: None, normal Patient's awareness of abnormal movements (rate only patient's report): No Awareness, Dental Status Current problems with teeth and/or dentures?: No Does patient usually wear dentures?: No  CIWA:  CIWA-Ar Total: 1 COWS:     Musculoskeletal: Strength & Muscle Tone: within normal limits Gait & Station:  normal Patient leans: N/A  Psychiatric Specialty Exam: Physical Exam  Nursing note and vitals reviewed.   Review of Systems  Psychiatric/Behavioral: Positive for substance abuse (Hx. alcoholism, chronic). Negative for depression, hallucinations, memory loss and suicidal ideas. The patient has insomnia. The patient is not nervous/anxious.     Blood pressure 106/84, pulse 87, temperature 97.7 F (36.5 C), temperature source Oral, resp. rate 16, height 5' 5"  (1.651 m), weight 49.4 kg (109 lb), last menstrual period 03/17/2018, SpO2 97 %.Body mass index is 18.14 kg/m.  General Appearance: Fairly Groomed  Eye Contact:  Good  Speech:  Normal Rate  Volume:  Decreased  Mood:  euthymic  Affect:  congruent  Thought Process:  Linear and Descriptions of Associations: Intact  Orientation:  Full (Time, Place, and Person)  Thought Content:  no hallucinations, no delusions, not internally preoccupied   Suicidal Thoughts:  No denies suicidal or self injurious ideations at this time, contracts for safety on unit , denies HI  Homicidal Thoughts:  No  Memory:  recent and remote grossly intact   Judgement:  Fair  Insight:  Fair  Psychomotor Activity:  Normal- no significant symptoms of alcohol WDL or psychomotor agitation at this time  Concentration:  Concentration: Good and Attention Span: Good  Recall:  Good  Fund of Knowledge:  Good  Language:  Good  Akathisia:  No  Handed:  Right  AIMS (if indicated):     Assets:  Communication Skills Desire for Improvement Resilience  ADL's:  Intact  Cognition:  WNL  Sleep:  Number of Hours: 5.25     Treatment Plan Summary: Daily contact with patient to assess and evaluate symptoms and progress in treatment.  - Continue inpatient hospitalization.  - Will continue today 03/21/2018 plan as below except where it is noted.  Alcohol detox.     - Continue the librium detoxification protocols as already in progress.  Depression.     - Continue  Citalopram 20 mg po daily.  Anxiety.     -  Continue Hydroxyzine  25 mg Q 6 hours prn.  Insomnia.     - Continue Trazodone 50 mg po Q hs prn.  Patient to attend & participate in the group session.  Discharge disposition ongoing. Recommend residential treatment for sustaining sobriety from alcohol.   Lavella Hammock, MD 03/21/2018, 5:26 PM

## 2018-03-21 NOTE — Progress Notes (Signed)
Patient was pleasant and cooperative upon approach. Patient is not displaying any withdrawal symptoms. Denies pain, denies SI HI AVH.  Patient compliant with medications prescribed per provider. Med education provided. Support and encouragement given.  Patient safety maintained with 15 minute checks as well as environmental checks. Will continue to monitor. 

## 2018-03-21 NOTE — Progress Notes (Signed)
D: Patient denies SI, HI or AVH this evening. Patient presents as depressed but pleasant and cooperative.  She states she had a better day and is feeling better.  She denies any physical complaints and denies any needs.    A: Patient given emotional support from RN. Patient encouraged to come to staff with concerns and/or questions. Patient's medication routine continued. Patient's orders and plan of care reviewed.   R: Patient remains appropriate and cooperative. Will continue to monitor patient q15 minutes for safety.

## 2018-03-21 NOTE — Plan of Care (Signed)
  Problem: Education: Goal: Emotional status will improve Outcome: Progressing Goal: Mental status will improve Outcome: Progressing   Problem: Activity: Goal: Interest or engagement in activities will improve Outcome: Progressing Goal: Sleeping patterns will improve Outcome: Progressing   Problem: Coping: Goal: Ability to verbalize frustrations and anger appropriately will improve Outcome: Progressing Goal: Ability to demonstrate self-control will improve Outcome: Progressing   Problem: Health Behavior/Discharge Planning: Goal: Identification of resources available to assist in meeting health care needs will improve Outcome: Progressing Goal: Compliance with treatment plan for underlying cause of condition will improve Outcome: Progressing   Problem: Physical Regulation: Goal: Ability to maintain clinical measurements within normal limits will improve Outcome: Progressing   Problem: Safety: Goal: Periods of time without injury will increase Outcome: Progressing   Problem: Education: Goal: Knowledge of disease or condition will improve Outcome: Progressing Goal: Understanding of discharge needs will improve Outcome: Progressing   Problem: Health Behavior/Discharge Planning: Goal: Ability to identify changes in lifestyle to reduce recurrence of condition will improve Outcome: Progressing   Problem: Coping: Goal: Coping ability will improve Outcome: Progressing   Problem: Safety: Goal: Ability to identify and utilize support systems that promote safety will improve Outcome: Progressing   Problem: Education: Goal: Ability to make informed decisions regarding treatment will improve Outcome: Progressing   Problem: Coping: Goal: Coping ability will improve Outcome: Progressing   Problem: Health Behavior/Discharge Planning: Goal: Identification of resources available to assist in meeting health care needs will improve Outcome: Progressing   Problem:  Medication: Goal: Compliance with prescribed medication regimen will improve Outcome: Progressing   Problem: Self-Concept: Goal: Ability to disclose and discuss suicidal ideas will improve Outcome: Progressing Goal: Will verbalize positive feelings about self Outcome: Progressing

## 2018-03-21 NOTE — Plan of Care (Signed)
D: Pt denies SI/HI/AVH. Pt is pleasant and cooperative. Pt stated she was a whole lot better , with people who understands what she is going through. Pt plans to go to 30 day rehab , [facility] and AA on D/C.   A: Pt was offered support and encouragement. Pt was given scheduled medications. Pt was encourage to attend groups. Q 15 minute checks were done for safety.   R:Pt attends groups and interacts well with peers and staff. Pt is taking medication. Pt has no complaints.Pt receptive to treatment and safety maintained on unit.   Problem: Education: Goal: Emotional status will improve Outcome: Progressing   Problem: Education: Goal: Mental status will improve Outcome: Progressing   Problem: Activity: Goal: Sleeping patterns will improve Outcome: Progressing   Problem: Safety: Goal: Periods of time without injury will increase Outcome: Progressing   Problem: Coping: Goal: Coping ability will improve Outcome: Progressing   Problem: Medication: Goal: Compliance with prescribed medication regimen will improve Outcome: Progressing

## 2018-03-21 NOTE — [facility] Group Notes (Signed)
LCSW Group Therapy Note  03/21/2018 1:15pm  Type of Therapy/Topic:  Group Therapy:  Feelings about Diagnosis  Participation Level:  Active   Description of Group:   This group will allow patients to explore their thoughts and feelings about diagnoses they have received. Patients will be guided to explore their level of understanding and acceptance of these diagnoses. Facilitator will encourage patients to process their thoughts and feelings about the reactions of others to their diagnosis and will guide patients in identifying ways to discuss their diagnosis with significant others in their lives. This group will be process-oriented, with patients participating in exploration of their own experiences, giving and receiving support, and processing challenge from other group members.   Therapeutic Goals: 1. Patient will demonstrate understanding of diagnosis as evidenced by identifying two or more symptoms of the disorder 2. Patient will be able to express two feelings regarding the diagnosis 3. Patient will demonstrate their ability to communicate their needs through discussion and/or role play  Summary of Patient Progress:  [name] was attentive and engaged during today's processing group. She shared that she would like to get long term treatment for her alcohol addiction. "I'm tired of living this way and letting my addiction affect my health." [name] was interested in AA as well and getting a referral to a facility from here. She continues to show progress in the group setting with improving insight. She plans to take time off work to focus on her recovery and is hoping to discharge Friday.   Therapeutic Modalities:   Cognitive Behavioral Therapy Brief Therapy Feelings Identification    Rona RavensHeather S Balthazar Dooly, LCSW 03/21/2018 2:33 PM

## 2018-03-21 NOTE — Progress Notes (Signed)
[facility] Group Notes:  (Nursing/MHT/Case Management/Adjunct)  Date:  03/21/2018  Time:  2045  Type of Therapy:  wrap up group  Participation Level:  Active  Participation Quality:  Appropriate, Attentive, Sharing and Supportive  Affect:  Appropriate  Cognitive:  Appropriate  Insight:  Improving  Engagement in Group:  Engaged  Modes of Intervention:  Clarification, Education and Support  Summary of Progress/Problems: If patient could change any one thing about her life it would be her alcohol addiction.   Marcille BuffyMcNeil, Shantell Belongia S 03/21/2018, 9:48 PM

## 2018-03-22 MED ORDER — CITALOPRAM HYDROBROMIDE 20 MG PO TABS: 20.0000 mg | ORAL_TABLET | Freq: Every day | ORAL | 0 refills | Status: DC

## 2018-03-22 MED ORDER — HYDROXYZINE HCL 25 MG PO TABS: 25.0000 mg | ORAL_TABLET | Freq: Four times a day (QID) | ORAL | 0 refills | Status: DC | PRN

## 2018-03-22 MED ORDER — TRAZODONE HCL 50 MG PO TABS: 50.0000 mg | ORAL_TABLET | Freq: Every evening | ORAL | 0 refills | Status: DC | PRN
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
## 2018-03-22 NOTE — Progress Notes (Signed)
  [facility] :  Will you be returning to the same living situation after discharge:  Yes,  home At discharge, do you have transportation home?: Yes,  family member Do you have the ability to pay for your medications: Yes,  [place]  Release of information consent forms completed and submitted to medical records by CSW.  Patient to Follow up at: Follow-up Information    [facility] Follow up on 03/25/2018.   Why:  Hospital follow-up on Monday, 03/25/18 at 9:00AM. Please bring hospital discharge paperwork to this appt. Thank you.  Contact information: [address] [place] [address] [phone]        [facility] Follow up.   Why:  Referral faxed: 03/21/18. If you are interested in pursuing residential treatment, please contact admissions at discharge to check status of referral. Thank you.  Contact information: [address] [place] [address] [phone]           Next level of care provider has [facility] to [facility] Link:no  Safety Planning and Suicide Prevention discussed: Yes,  SPE completed with pt's mother. SPI pamphlet and Mobile Crisis informatino provided to pt as well.   Have you used any form of tobacco in the last 30 days? (Cigarettes, Smokeless Tobacco, Cigars, and/or Pipes): Patient Refused Screening  Has patient been referred to the Quitline?: Patient refused referral  Patient has been referred for addiction treatment: Yes  Rona RavensHeather S Veronnica Hennings, LCSW 03/22/2018, 9:03 AM

## 2018-03-22 NOTE — Progress Notes (Signed)
Patient self inventory- Patient slept well last night, medication was not requested. Energy level is normal, concentration good. Depression, hopelessness, and anxiety rated 0 out of 10. Denies SI HI AVH. Patient's goal is "getting my life together."  Patient compliant with medications prescribed per provider. Safety maintained with 15 minute checks as well as environmental checks. Will continue to monitor.
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
## 2018-03-22 NOTE — Progress Notes (Signed)
Discharge note: Patient reviewed discharge paperwork with RN including prescriptions, follow up appointments, and lab work. Patient given the opportunity to ask questions. All concerns were addressed. All belongings were returned to patient. Denied SI/HI/AVH. Patient thanked staff for their care while at the hospital.  Patient was discharged to lobby. Patient's mother was in the parking lot waiting to pick her up.
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
## 2018-03-22 NOTE — Plan of Care (Signed)
  Problem: Education: Goal: Emotional status will improve Outcome: Completed/Met   Problem: Education: Goal: Mental status will improve Outcome: Completed/Met   Problem: Activity: Goal: Interest or engagement in activities will improve Outcome: Completed/Met

## 2018-03-22 NOTE — Progress Notes (Signed)
Recreation Therapy Notes  Date: 7.26.19 Time: 0930 Location: [address] Dayroom  Group Topic: Stress Management  Goal Area(s) Addresses:  Patient will verbalize importance of using healthy stress management.  Patient will identify positive emotions associated with healthy stress management.   Behavioral Response: Engaged  Intervention: Stress Management  Activity : Progressive Muscle Relaxation.  LRT introduced the stress management technique of progressive muscle relaxation.  LRT read a script that guided patients through a series of tensing muscles and then relaxing.  Patients were to follow along as LRT read script to engage in the activity.  Education:  Stress Management, Discharge Planning.   Education Outcome: Acknowledges edcuation/In group clarification offered/Needs additional education  Clinical Observations/Feedback: Pt attended and participated in group.    Caroll RancherMarjette Alic Hilburn, LRT/CTRS         Lillia AbedLindsay, Deshanna Kama A 03/22/2018 10:56 AM

## 2018-03-27 ENCOUNTER — Telehealth: Payer: Self-pay

## 2018-03-27 NOTE — Telephone Encounter (Signed)
Left Msg  

## 2018-04-02 ENCOUNTER — Telehealth: Payer: Self-pay

## 2018-04-02 NOTE — Telephone Encounter (Signed)
[facility] - left a message on 03-27-2018.  Voice mailbox was not set up to receive messages on 04-02-2018.  Writer unable to leave a message.   This is the second attempt to reach the patient since the [facility]

## 2018-04-18 NOTE — Progress Notes (Deleted)
Psychiatric Initial Adult Assessment   Patient Identification: Brandy Vega MRN:  [id number] Date of Evaluation:  04/18/2018 Referral Source: *** Chief Complaint:   Visit Diagnosis: No diagnosis found.  History of Present Illness:   Brandy Vega is a 42 y.o. year old female with a history of depression, alcohol use disorder, who is referred for after care. Patient [facility] July 2019 for SI in the context of alcohol use.    Associated Signs/Symptoms: Depression Symptoms:  {DEPRESSION SYMPTOMS:20000} (Hypo) Manic Symptoms:  {BHH MANIC SYMPTOMS:22872} Anxiety Symptoms:  {[facility] ANXIETY SYMPTOMS:22873} Psychotic Symptoms:  {BHH PSYCHOTIC SYMPTOMS:22874} PTSD Symptoms: {B[facility] PTSD SYMPTOMS:22875}  Past Psychiatric History:  Outpatient:  Psychiatry admission:  Previous suicide attempt:  Past trials of medication:  History of violence:   Previous Psychotropic Medications: {YES/NO:21197}  Substance Abuse History in the last 12 months:  {yes no:314532}  Consequences of Substance Abuse: {[facility] CONSEQUENCES OF SUBSTANCE ABUSE:22880}  Past Medical History:  Past Medical History:  Diagnosis Date  . MDD (major depressive disorder), recurrent episode, severe (HCC) 03/18/2018    Past Surgical History:  Procedure Laterality Date  . DILATION AND CURETTAGE OF UTERUS      Family Psychiatric History: ***  Family History:  Family History  Problem Relation Age of Onset  . Hypertension Mother   . Diabetes Father     Social History:   Social History   Socioeconomic History  . Marital status: Single    Spouse name: Not on file  . Number of children: Not on file  . Years of education: Not on file  . Highest education level: Not on file  Occupational History  . Not on file  Social Needs  . Financial resource strain: Not on file  . Food insecurity:    Worry: Not on file    Inability: Not on file  . Transportation needs:    Medical: Not on file    Non-medical: Not on  file  Tobacco Use  . Smoking status: Current Every Day Smoker    Packs/day: 0.50    Types: Cigarettes  . Smokeless tobacco: Never Used  Substance and Sexual Activity  . Alcohol use: Yes    Comment: occ. use  . Drug use: No  . Sexual activity: Yes    Birth control/protection: None  Lifestyle  . Physical activity:    Days per week: Not on file    Minutes per session: Not on file  . Stress: Not on file  Relationships  . Social connections:    Talks on phone: Not on file    Gets together: Not on file    Attends religious service: Not on file    Active member of club or organization: Not on file    Attends meetings of clubs or organizations: Not on file    Relationship status: Not on file  Other Topics Concern  . Not on file  Social History Narrative  . Not on file    Additional Social History: ***  Allergies:  No Known Allergies  Metabolic Disorder Labs: No results found for: HGBA1C, MPG No results found for: PROLACTIN No results found for: CHOL, TRIG, HDL, CHOLHDL, VLDL, LDLCALC   Current Medications: Current Outpatient Medications  Medication Sig Dispense Refill  . citalopram (CELEXA) 20 MG tablet Take 1 tablet (20 mg total) by mouth daily. For depression 30 tablet 0  . hydrOXYzine (ATARAX/VISTARIL) 25 MG tablet Take 1 tablet (25 mg total) by mouth every 6 (six) hours as needed for  anxiety. 30 tablet 0  . traZODone (DESYREL) 50 MG tablet Take 1 tablet (50 mg total) by mouth at bedtime as needed for sleep. 30 tablet 0   No current facility-administered medications for this visit.     Neurologic: Headache: No Seizure: No Paresthesias:No  Musculoskeletal: Strength & Muscle Tone: within normal limits Gait & Station: normal Patient leans: N/A  Psychiatric Specialty Exam: ROS  There were no vitals taken for this visit.There is no height or weight on file to calculate BMI.  General Appearance: Fairly Groomed  Eye Contact:  Good  Speech:  Clear and Coherent   Volume:  Normal  Mood:  {B[facility] MOOD:22306}  Affect:  {Affect (PAA):22687}  Thought Process:  Coherent  Orientation:  Full (Time, Place, and Person)  Thought Content:  Logical  Suicidal Thoughts:  {ST/HT (PAA):22692}  Homicidal Thoughts:  {ST/HT (PAA):22692}  Memory:  Immediate;   Good  Judgement:  {Judgement (PAA):22694}  Insight:  {Insight (PAA):22695}  Psychomotor Activity:  Normal  Concentration:  Concentration: Good and Attention Span: Good  Recall:  Good  Fund of Knowledge:Good  Language: Good  Akathisia:  No  Handed:  Right  AIMS (if indicated):  N/A  Assets:  Communication Skills Desire for Improvement  ADL's:  Intact  Cognition: WNL  Sleep:  ***   Assessment  Plan  The patient demonstrates the following risk factors for suicide: Chronic risk factors for suicide include: {Chronic Risk Factors for ZOXWRUE:45409811}. Acute risk factors for suicide include: {Acute Risk Factors for BJYNWGN:56213086}. Protective factors for this patient include: {Protective Factors for Suicide VHQI:69629528}. Considering these factors, the overall suicide risk at this point appears to be {Desc; low/moderate/high:110033}. Patient {ACTION; IS/IS UXL:24401027} appropriate for outpatient follow up.   Treatment Plan Summary: Plan as above   Neysa Hotter, MD 8/22/20194:14 PM

## 2018-04-23 ENCOUNTER — Ambulatory Visit (HOSPITAL_COMMUNITY): Payer: Self-pay | Admitting: Psychiatry

## 2018-04-30 ENCOUNTER — Telehealth: Payer: Self-pay

## 2018-04-30 DIAGNOSIS — F339 Major depressive disorder, recurrent, unspecified: Secondary | ICD-10-CM

## 2018-04-30 NOTE — BH Specialist Note (Signed)
Error in charting.

## 2018-04-30 NOTE — Telephone Encounter (Signed)
Per patient - she does not need [facility] services because she has been receiving services with [facility].  Patient has a decreased PHQ and GAD score   GAD 7 : Generalized Anxiety Score 04/30/2018  Nervous, Anxious, on Edge 1  Control/stop worrying 1  Worry too much - different things 1  Trouble relaxing 1  Restless 1  Easily annoyed or irritable 1  Afraid - awful might happen 1  Total GAD 7 Score 7  Anxiety Difficulty Not difficult at all     Depression screen [facility] 2/9 04/30/2018 03/07/2018 04/13/2017  Decreased Interest 1 3 0  Down, Depressed, Hopeless 1 3 0  PHQ - 2 Score 2 6 0  Altered sleeping 1 0 -  Tired, decreased energy 1 3 -  Change in appetite 0 2 -  Feeling bad or failure about yourself  1 0 -  Trouble concentrating 0 3 -  Moving slowly or fidgety/restless 0 3 -  Suicidal thoughts 0 2 -  PHQ-9 Score 5 19 -  Difficult doing work/chores Not difficult at all - -

## 2018-05-14 ENCOUNTER — Encounter: Payer: Self-pay | Admitting: Physician Assistant

## 2018-05-14 ENCOUNTER — Ambulatory Visit (INDEPENDENT_AMBULATORY_CARE_PROVIDER_SITE_OTHER): Payer: BLUE CROSS/BLUE SHIELD | Admitting: Physician Assistant

## 2018-05-14 VITALS — BP 114/79 | HR 75 | Temp 97.6°F | Ht 65.0 in | Wt 122.0 lb

## 2018-05-14 DIAGNOSIS — F1020 Alcohol dependence, uncomplicated: Secondary | ICD-10-CM | POA: Diagnosis not present

## 2018-05-14 DIAGNOSIS — N938 Other specified abnormal uterine and vaginal bleeding: Secondary | ICD-10-CM

## 2018-05-14 DIAGNOSIS — F332 Major depressive disorder, recurrent severe without psychotic features: Secondary | ICD-10-CM

## 2018-05-14 MED ORDER — CITALOPRAM HYDROBROMIDE 20 MG PO TABS: 20.0000 mg | ORAL_TABLET | Freq: Every day | ORAL | 1 refills | Status: DC

## 2018-05-14 MED ORDER — PROPRANOLOL HCL 10 MG PO TABS: 10.0000 mg | ORAL_TABLET | Freq: Three times a day (TID) | ORAL | 1 refills | Status: DC | PRN

## 2018-05-14 MED ORDER — DESOGESTREL-ETHINYL ESTRADIOL 0.15-0.02/0.01 MG (21/5) PO TABS: 1.0000 | ORAL_TABLET | Freq: Every day | ORAL | 11 refills | Status: DC

## 2018-05-16 DIAGNOSIS — N938 Other specified abnormal uterine and vaginal bleeding: Secondary | ICD-10-CM | POA: Insufficient documentation

## 2018-05-16 NOTE — Progress Notes (Signed)
BP 114/79   Pulse 75   Temp 97.6 F (36.4 C) (Oral)   Ht 5\' 5"  (1.651 m)   Wt 122 lb (55.3 kg)   BMI 20.30 kg/m    Subjective:    Patient ID: Brandy Vega, female    DOB: [birth year], 42 y.o.   MRN: [id number]  HPI: Brandy Vega is a 42 y.o. female presenting on 05/14/2018 for Oligomenorrhea (x 2 weeks)  This patient comes in for periodic recheck on medications and conditions including recovering from alcohol abuse, depression and heavy menses for 2 weeks or more. She has been 45 days clean and had gone to inpatient recovery. She had not been taking her medications over the past couple of weeks and yesterday relapsed and drank. She denies and suicidal plans, though she does feel very depressed.  She will be going to stay with her mother. She states that she has had severe bleeding for two weeks. She has never had this before.  All medications are reviewed today. There are no reports of any problems with the medications. All of the medical conditions are reviewed and updated.  Lab work is reviewed and will be ordered as medically necessary. There are no new problems reported with today's visit.   Past Medical History:  Diagnosis Date  . MDD (major depressive disorder), recurrent episode, severe (HCC) 03/18/2018   Relevant past medical, surgical, family and social history reviewed and updated as indicated. Interim medical history since our last visit reviewed. Allergies and medications reviewed and updated. DATA REVIEWED: CHART IN EPIC  Family History reviewed for pertinent findings.  Review of Systems  Constitutional: Negative.   HENT: Negative.   Eyes: Negative.   Respiratory: Negative.   Gastrointestinal: Negative.   Genitourinary: Negative.   Psychiatric/Behavioral: Positive for dysphoric mood. Negative for self-injury, sleep disturbance and suicidal ideas. The patient is nervous/anxious.     Allergies as of 05/14/2018   No Known Allergies     Medication List        Accurate as of 05/14/18 11:59 PM. Always use your most recent med list.          citalopram 20 MG tablet Commonly known as:  CELEXA Take 1 tablet (20 mg total) by mouth daily. For depression   desogestrel-ethinyl estradiol 0.15-0.02/0.01 MG (21/5) tablet Commonly known as:  KARIVA,AZURETTE,MIRCETTE Take 1 tablet by mouth daily.   hydrOXYzine 25 MG tablet Commonly known as:  ATARAX/VISTARIL Take 1 tablet (25 mg total) by mouth every 6 (six) hours as needed for anxiety.   propranolol 10 MG tablet Commonly known as:  INDERAL Take 1 tablet (10 mg total) by mouth 3 (three) times daily as needed. for anxiety   traZODone 50 MG tablet Commonly known as:  DESYREL Take 1 tablet (50 mg total) by mouth at bedtime as needed for sleep.          Objective:    BP 114/79   Pulse 75   Temp 97.6 F (36.4 C) (Oral)   Ht 5\' 5"  (1.651 m)   Wt 122 lb (55.3 kg)   BMI 20.30 kg/m   No Known Allergies  Wt Readings from Last 3 Encounters:  05/14/18 122 lb (55.3 kg)  03/07/18 113 lb 3.2 oz (51.3 kg)  04/13/17 115 lb (52.2 kg)    Physical Exam  Constitutional: She is oriented to person, place, and time. She appears well-developed and well-nourished.  HENT:  Head: Normocephalic and atraumatic.  Eyes: Pupils are equal, round, and reactive to  light. Conjunctivae and EOM are normal.  Cardiovascular: Normal rate, regular rhythm, normal heart sounds and intact distal pulses.  Pulmonary/Chest: Effort normal and breath sounds normal.  Abdominal: Soft. Bowel sounds are normal.  Neurological: She is alert and oriented to person, place, and time. She has normal reflexes.  Skin: Skin is warm and dry. No rash noted.  Psychiatric: She has a normal mood and affect. Her behavior is normal. Judgment and thought content normal.    No results found for this or any previous visit.    Assessment & Plan:   1. DUB (dysfunctional uterine bleeding) - desogestrel-ethinyl estradiol (KARIVA) 0.15-0.02/0.01 MG  (21/5) tablet; Take 1 tablet by mouth daily.  Dispense: 1 Package; Refill: 11  2. Alcohol use disorder, severe, dependence (HCC) Continue meetings and working with counselor  3. Severe episode of recurrent major depressive disorder, without psychotic features (HCC) - citalopram (CELEXA) 20 MG tablet; Take 1 tablet (20 mg total) by mouth daily. For depression  Dispense: 30 tablet; Refill: 1 - propranolol (INDERAL) 10 MG tablet; Take 1 tablet (10 mg total) by mouth 3 (three) times daily as needed. for anxiety  Dispense: 60 tablet; Refill: 1   Continue all other maintenance medications as listed above.  Follow up plan: Return in about 4 weeks (around 06/11/2018).  Educational handout given for survey  Remus LofflerAngel S. Ayaansh Smail PA-C [facility] [address]  [place], [place] [address] [phone]   05/16/2018, 8:50 PM

## 2018-08-23 ENCOUNTER — Ambulatory Visit: Payer: Managed Care, Other (non HMO) | Admitting: *Deleted

## 2018-08-23 VITALS — BP 149/65 | HR 94

## 2018-08-23 DIAGNOSIS — R030 Elevated blood-pressure reading, without diagnosis of hypertension: Secondary | ICD-10-CM

## 2018-08-23 DIAGNOSIS — R739 Hyperglycemia, unspecified: Secondary | ICD-10-CM

## 2018-08-23 LAB — GLUCOSE HEMOCUE WAIVED: GLU HEMOCUE WAIVED: 88 mg/dL (ref 65–99)

## 2018-08-23 NOTE — Progress Notes (Signed)
Patient came in today complaining with her BP and BS being elevated. Patient states at work her BP was 160/105. Patient states she was feeling light headed but that was improved. Patient is currently on propanolol. Patient BP (!) 149/65   Pulse 94 . Patients bs was 88. Patient advised if she became light heade, sob, had chest pain to be evaluated by the ED. Patient given an appointment for 10:00am in the morning with Dr. Oswaldo DoneVincent.

## 2018-08-23 NOTE — Addendum Note (Signed)
Addended by: Tamera PuntWRAY, WENDY S on: 08/23/2018 03:24 PM   Modules accepted: Orders

## 2018-08-24 ENCOUNTER — Ambulatory Visit (INDEPENDENT_AMBULATORY_CARE_PROVIDER_SITE_OTHER): Payer: Managed Care, Other (non HMO) | Admitting: Pediatrics

## 2018-08-24 ENCOUNTER — Encounter: Payer: Self-pay | Admitting: Pediatrics

## 2018-08-24 VITALS — BP 149/99 | HR 94 | Temp 98.5°F | Ht 65.0 in | Wt 119.0 lb

## 2018-08-24 DIAGNOSIS — Z7289 Other problems related to lifestyle: Secondary | ICD-10-CM | POA: Diagnosis not present

## 2018-08-24 DIAGNOSIS — R030 Elevated blood-pressure reading, without diagnosis of hypertension: Secondary | ICD-10-CM | POA: Diagnosis not present

## 2018-08-24 DIAGNOSIS — R4020 Unspecified coma: Secondary | ICD-10-CM | POA: Diagnosis not present

## 2018-08-24 DIAGNOSIS — Z789 Other specified health status: Secondary | ICD-10-CM

## 2018-08-24 NOTE — Progress Notes (Signed)
Subjective:   Patient ID: Brandy Vega, female    DOB: 1975/10/20, 42 y.o.   MRN: [id number] CC: BP has been high at home (Had a seizure on thursday)  HPI: Brandy Vega is a 42 y.o. female   Blood pressure at work up to 165/105 yesterday.  Was sent home from work. She did feel lightheaded with the elevated blood pressure. Feeling abck to normal self now.  History of heavy alcohol use.  Was in rehab, discharged in August.  Before rehab was drinking heavy amounts of beer and liquor daily.  After rehab she has been drinking 2 beers most days, not drinking on days she works often.  Over the holidays she thinks she might of been drinking a little bit more, had 2 beers and a shot of liquor 3 days ago, similar the next day.  No alcohol use yesterday because she was working.  She has gone several days without alcohol without withdrawal in the last couple of months without problems.  She has felt shaky with withdrawal in the past.  She does not feel like she is withdrawing now.  Episode 2 days ago, she was sitting on the couch with her boyfriend.  She says he told her she " locked up" for several minutes.  She is not sure if she was awake responding at all during this time.  No loss of bladder or bowel function.  She felt like her usual self when she came around but does not remember the episode itself.  She was switched from hydroxyzine to propranolol for anxiety when in rehab. She says she tries to check her BP before taking the propranolol, usually taking at work. Her mother has a BP cuff, lives in area but not with pt, pt doesn't have a way to check BP at home regularly. She has had lightheadedness with the propranolol in the past. Not recently.   No nausea, shakiness now.   Relevant past medical, surgical, family and social history reviewed. Allergies and medications reviewed and updated. Social History   Tobacco Use  Smoking Status Current Every Day Smoker  . Packs/day: 0.50  . Types:  Cigarettes  Smokeless Tobacco Never Used   ROS: Per HPI   Objective:    BP (!) 149/99   Pulse 94   Temp 98.5 F (36.9 C) (Oral)   Ht 5\' 5"  (1.651 m)   Wt 119 lb (54 kg)   BMI 19.80 kg/m   Wt Readings from Last 3 Encounters:  08/24/18 119 lb (54 kg)  05/14/18 122 lb (55.3 kg)  03/07/18 113 lb 3.2 oz (51.3 kg)    Gen: NAD, alert, cooperative with exam, NCAT EYES: EOMI, no conjunctival injection, or no icterus ENT:   OP without erythema LYMPH: no cervical LAD CV: NRRR, normal S1/S2, no murmur, distal pulses 2+ b/l Resp: CTABL, no wheezes, normal WOB Abd: +BS, soft, NTND.  Ext: No edema, warm Neuro: Alert and oriented, strength equal b/l UE and LE, coordination grossly normal, no tremor MSK: normal muscle bulk  Assessment & Plan:  Brandy Vega was seen today for bp has been high at home.  Diagnoses and all orders for this visit:  Elevated blood pressure reading Check BPs at home, with mothers cuff, drug store or at work. Take propranolol twice a day. Bring BP readings to clinic visit in two weeks. Possible that alcohol withdrawal could be contributing to elevated BP, usually BPs are not elevated per chart review.   Alcohol use Pt not interested in  cessation of alcohol at this time. Continue to encourage cessation and cutting back. She sees counselor at work at times. Attends AA meetings when she is able to get a ride. Mood has been ok. Has phone number for [facility] program for depression if needed.  LOC (loss of consciousness) (HCC) Pt doesn't remember episode, she will ask boyfriend for more information. She describes it as seizure. Has not had seizures before. -     Ambulatory referral to Neurology   Follow up plan: Return in about 2 weeks (around 09/07/2018) for Follow-up blood pressure. Rex Krasarol Vincent, MD [facility]

## 2018-08-24 NOTE — Patient Instructions (Addendum)
Take propranolol twice a day.  Check blood pressures when you are able to.  Bring numbers to next office visit.  Follow-up with PCP in 1 to 2 weeks.

## 2018-09-02 ENCOUNTER — Encounter: Payer: Self-pay | Admitting: Physician Assistant

## 2018-09-02 ENCOUNTER — Ambulatory Visit (INDEPENDENT_AMBULATORY_CARE_PROVIDER_SITE_OTHER): Payer: Managed Care, Other (non HMO) | Admitting: Physician Assistant

## 2018-09-02 VITALS — BP 159/99 | HR 95 | Temp 99.4°F | Ht 65.0 in | Wt 122.2 lb

## 2018-09-02 DIAGNOSIS — I10 Essential (primary) hypertension: Secondary | ICD-10-CM

## 2018-09-02 MED ORDER — SPIRONOLACTONE 25 MG PO TABS: 25.0000 mg | ORAL_TABLET | Freq: Every day | ORAL | 1 refills | Status: DC

## 2018-09-02 NOTE — Patient Instructions (Signed)
DASH Eating Plan  DASH stands for "Dietary Approaches to Stop Hypertension." The DASH eating plan is a healthy eating plan that has been shown to reduce high blood pressure (hypertension). It may also reduce your risk for type 2 diabetes, heart disease, and stroke. The DASH eating plan may also help with weight loss.  What are tips for following this plan?    General guidelines   Avoid eating more than 2,300 mg (milligrams) of salt (sodium) a day. If you have hypertension, you may need to reduce your sodium intake to 1,500 mg a day.   Limit alcohol intake to no more than 1 drink a day for nonpregnant women and 2 drinks a day for men. One drink equals 12 oz of beer, 5 oz of wine, or 1 oz of hard liquor.   Work with your health care provider to maintain a healthy body weight or to lose weight. Ask what an ideal weight is for you.   Get at least 30 minutes of exercise that causes your heart to beat faster (aerobic exercise) most days of the week. Activities may include walking, swimming, or biking.   Work with your health care provider or diet and nutrition specialist (dietitian) to adjust your eating plan to your individual calorie needs.  Reading food labels     Check food labels for the amount of sodium per serving. Choose foods with less than 5 percent of the Daily Value of sodium. Generally, foods with less than 300 mg of sodium per serving fit into this eating plan.   To find whole grains, look for the word "whole" as the first word in the ingredient list.  Shopping   Buy products labeled as "low-sodium" or "no salt added."   Buy fresh foods. Avoid canned foods and premade or frozen meals.  Cooking   Avoid adding salt when cooking. Use salt-free seasonings or herbs instead of table salt or sea salt. Check with your health care provider or pharmacist before using salt substitutes.   Do not fry foods. Cook foods using healthy methods such as baking, boiling, grilling, and broiling instead.   Cook with  heart-healthy oils, such as olive, canola, soybean, or sunflower oil.  Meal planning   Eat a balanced diet that includes:  ? 5 or more servings of fruits and vegetables each day. At each meal, try to fill half of your plate with fruits and vegetables.  ? Up to 6-8 servings of whole grains each day.  ? Less than 6 oz of lean meat, poultry, or fish each day. A 3-oz serving of meat is about the same size as a deck of cards. One egg equals 1 oz.  ? 2 servings of low-fat dairy each day.  ? A serving of nuts, seeds, or beans 5 times each week.  ? Heart-healthy fats. Healthy fats called Omega-3 fatty acids are found in foods such as flaxseeds and coldwater fish, like sardines, salmon, and mackerel.   Limit how much you eat of the following:  ? Canned or prepackaged foods.  ? Food that is high in trans fat, such as fried foods.  ? Food that is high in saturated fat, such as fatty meat.  ? Sweets, desserts, sugary drinks, and other foods with added sugar.  ? Full-fat dairy products.   Do not salt foods before eating.   Try to eat at least 2 vegetarian meals each week.   Eat more home-cooked food and less restaurant, buffet, and fast food.     When eating at a restaurant, ask that your food be prepared with less salt or no salt, if possible.  What foods are recommended?  The items listed may not be a complete list. Talk with your dietitian about what dietary choices are best for you.  Grains  Whole-grain or whole-wheat bread. Whole-grain or whole-wheat pasta. Brown rice. Oatmeal. Quinoa. Bulgur. Whole-grain and low-sodium cereals. Pita bread. Low-fat, low-sodium crackers. Whole-wheat flour tortillas.  Vegetables  Fresh or frozen vegetables (raw, steamed, roasted, or grilled). Low-sodium or reduced-sodium tomato and vegetable juice. Low-sodium or reduced-sodium tomato sauce and tomato paste. Low-sodium or reduced-sodium canned vegetables.  Fruits  All fresh, dried, or frozen fruit. Canned fruit in natural juice (without  added sugar).  Meat and other protein foods  Skinless chicken or turkey. Ground chicken or turkey. Pork with fat trimmed off. Fish and seafood. Egg whites. Dried beans, peas, or lentils. Unsalted nuts, nut butters, and seeds. Unsalted canned beans. Lean cuts of beef with fat trimmed off. Low-sodium, lean deli meat.  Dairy  Low-fat (1%) or fat-free (skim) milk. Fat-free, low-fat, or reduced-fat cheeses. Nonfat, low-sodium ricotta or cottage cheese. Low-fat or nonfat yogurt. Low-fat, low-sodium cheese.  Fats and oils  Soft margarine without trans fats. Vegetable oil. Low-fat, reduced-fat, or light mayonnaise and salad dressings (reduced-sodium). Canola, safflower, olive, soybean, and sunflower oils. Avocado.  Seasoning and other foods  Herbs. Spices. Seasoning mixes without salt. Unsalted popcorn and pretzels. Fat-free sweets.  What foods are not recommended?  The items listed may not be a complete list. Talk with your dietitian about what dietary choices are best for you.  Grains  Baked goods made with fat, such as croissants, muffins, or some breads. Dry pasta or rice meal packs.  Vegetables  Creamed or fried vegetables. Vegetables in a cheese sauce. Regular canned vegetables (not low-sodium or reduced-sodium). Regular canned tomato sauce and paste (not low-sodium or reduced-sodium). Regular tomato and vegetable juice (not low-sodium or reduced-sodium). Pickles. Olives.  Fruits  Canned fruit in a light or heavy syrup. Fried fruit. Fruit in cream or butter sauce.  Meat and other protein foods  Fatty cuts of meat. Ribs. Fried meat. Bacon. Sausage. Bologna and other processed lunch meats. Salami. Fatback. Hotdogs. Bratwurst. Salted nuts and seeds. Canned beans with added salt. Canned or smoked fish. Whole eggs or egg yolks. Chicken or turkey with skin.  Dairy  Whole or 2% milk, cream, and half-and-half. Whole or full-fat cream cheese. Whole-fat or sweetened yogurt. Full-fat cheese. Nondairy creamers. Whipped toppings.  Processed cheese and cheese spreads.  Fats and oils  Butter. Stick margarine. Lard. Shortening. Ghee. Bacon fat. Tropical oils, such as coconut, palm kernel, or palm oil.  Seasoning and other foods  Salted popcorn and pretzels. Onion salt, garlic salt, seasoned salt, table salt, and sea salt. Worcestershire sauce. Tartar sauce. Barbecue sauce. Teriyaki sauce. Soy sauce, including reduced-sodium. Steak sauce. Canned and packaged gravies. Fish sauce. Oyster sauce. Cocktail sauce. Horseradish that you find on the shelf. Ketchup. Mustard. Meat flavorings and tenderizers. Bouillon cubes. Hot sauce and Tabasco sauce. Premade or packaged marinades. Premade or packaged taco seasonings. Relishes. Regular salad dressings.  Where to find more information:   National Heart, Lung, and Blood Institute: www.nhlbi.nih.gov   American Heart Association: www.heart.org  Summary   The DASH eating plan is a healthy eating plan that has been shown to reduce high blood pressure (hypertension). It may also reduce your risk for type 2 diabetes, heart disease, and stroke.   With the   DASH eating plan, you should limit salt (sodium) intake to 2,300 mg a day. If you have hypertension, you may need to reduce your sodium intake to 1,500 mg a day.   When on the DASH eating plan, aim to eat more fresh fruits and vegetables, whole grains, lean proteins, low-fat dairy, and heart-healthy fats.   Work with your health care provider or diet and nutrition specialist (dietitian) to adjust your eating plan to your individual calorie needs.  This information is not intended to replace advice given to you by your health care provider. Make sure you discuss any questions you have with your health care provider.  Document Released: 08/03/2011 Document Revised: 08/07/2016 Document Reviewed: 08/07/2016  [employer]vier Interactive Patient Education  2019 [employer]

## 2018-09-05 ENCOUNTER — Encounter: Payer: Self-pay | Admitting: Physician Assistant

## 2018-09-05 DIAGNOSIS — I10 Essential (primary) hypertension: Secondary | ICD-10-CM

## 2018-09-05 HISTORY — DX: Essential (primary) hypertension: I10

## 2018-09-05 NOTE — Progress Notes (Signed)
BP (!) 159/99   Pulse 95   Temp 99.4 F (37.4 C) (Oral)   Ht 5\' 5"  (1.651 m)   Wt 122 lb 3.2 oz (55.4 kg)   BMI 20.34 kg/m    Subjective:    Patient ID: Brandy Vega, female    DOB: 1976/01/25, 43 y.o.   MRN: [id number]  HPI: Brandy Vega is a 43 y.o. female presenting on 09/02/2018 for Hypertension  This patient comes in for periodic recheck on medications and conditions including hypertension.   All medications are reviewed today. There are no reports of any problems with the medications. All of the medical conditions are reviewed and updated.  Lab work is reviewed and will be ordered as medically necessary. There are no new problems reported with today's visit.   Past Medical History:  Diagnosis Date  . Essential hypertension 09/05/2018  . MDD (major depressive disorder), recurrent episode, severe (HCC) 03/18/2018   Relevant past medical, surgical, family and social history reviewed and updated as indicated. Interim medical history since our last visit reviewed. Allergies and medications reviewed and updated. DATA REVIEWED: CHART IN EPIC  Family History reviewed for pertinent findings.  Review of Systems  Constitutional: Negative.   HENT: Negative.   Eyes: Negative.   Respiratory: Negative.   Gastrointestinal: Negative.   Genitourinary: Negative.     Allergies as of 09/02/2018   No Known Allergies     Medication List       Accurate as of September 02, 2018 11:59 PM. Always use your most recent med list.        citalopram 20 MG tablet Commonly known as:  CELEXA Take 1 tablet (20 mg total) by mouth daily. For depression   propranolol 10 MG tablet Commonly known as:  INDERAL Take 1 tablet (10 mg total) by mouth 3 (three) times daily as needed. for anxiety   spironolactone 25 MG tablet Commonly known as:  ALDACTONE Take 1 tablet (25 mg total) by mouth daily.   traZODone 50 MG tablet Commonly known as:  DESYREL Take 1 tablet (50 mg total) by mouth at bedtime  as needed for sleep.          Objective:    BP (!) 159/99   Pulse 95   Temp 99.4 F (37.4 C) (Oral)   Ht 5\' 5"  (1.651 m)   Wt 122 lb 3.2 oz (55.4 kg)   BMI 20.34 kg/m   No Known Allergies  Wt Readings from Last 3 Encounters:  09/02/18 122 lb 3.2 oz (55.4 kg)  08/24/18 119 lb (54 kg)  05/14/18 122 lb (55.3 kg)    Physical Exam Constitutional:      Appearance: She is well-developed.  HENT:     Head: Normocephalic and atraumatic.  Eyes:     Conjunctiva/sclera: Conjunctivae normal.     Pupils: Pupils are equal, round, and reactive to light.  Cardiovascular:     Rate and Rhythm: Normal rate and regular rhythm.     Heart sounds: Normal heart sounds.  Pulmonary:     Effort: Pulmonary effort is normal.     Breath sounds: Normal breath sounds.  Abdominal:     General: Bowel sounds are normal.     Palpations: Abdomen is soft.  Skin:    General: Skin is warm and dry.     Findings: No rash.  Neurological:     Mental Status: She is alert and oriented to person, place, and time.     Deep Tendon Reflexes:  Reflexes are normal and symmetric.  Psychiatric:        Behavior: Behavior normal.        Thought Content: Thought content normal.        Judgment: Judgment normal.     Results for orders placed or performed in visit on 08/23/18  Glucose Hemocue Waived  Result Value Ref Range   Glu Hemocue Waived 88 65 - 99 mg/dL      Assessment & Plan:   1. Essential hypertension - spironolactone (ALDACTONE) 25 MG tablet; Take 1 tablet (25 mg total) by mouth daily.  Dispense: 30 tablet; Refill: 1   Continue all other maintenance medications as listed above.  Follow up plan: Return in about 4 weeks (around 09/30/2018).  Educational handout given for survey  Remus Loffler PA-C [facility] [address]  [place], [place] [address] [phone]   09/05/2018, 8:20 PM

## 2018-09-30 ENCOUNTER — Ambulatory Visit: Payer: Self-pay | Admitting: Physician Assistant

## 2018-10-11 ENCOUNTER — Ambulatory Visit (INDEPENDENT_AMBULATORY_CARE_PROVIDER_SITE_OTHER): Payer: Managed Care, Other (non HMO) | Admitting: Physician Assistant

## 2018-10-11 ENCOUNTER — Encounter: Payer: Self-pay | Admitting: Physician Assistant

## 2018-10-11 VITALS — BP 125/77 | HR 70 | Temp 98.5°F | Ht 65.0 in | Wt 124.2 lb

## 2018-10-11 DIAGNOSIS — I10 Essential (primary) hypertension: Secondary | ICD-10-CM | POA: Diagnosis not present

## 2018-10-11 DIAGNOSIS — F332 Major depressive disorder, recurrent severe without psychotic features: Secondary | ICD-10-CM | POA: Diagnosis not present

## 2018-10-12 LAB — CBC WITH DIFFERENTIAL/PLATELET
BASOS ABS: 0.1 10*3/uL (ref 0.0–0.2)
Basos: 1 %
EOS (ABSOLUTE): 0.1 10*3/uL (ref 0.0–0.4)
EOS: 1 %
HEMATOCRIT: 40.8 % (ref 34.0–46.6)
Hemoglobin: 13.8 g/dL (ref 11.1–15.9)
IMMATURE GRANULOCYTES: 0 %
Immature Grans (Abs): 0 10*3/uL (ref 0.0–0.1)
Lymphocytes Absolute: 1.5 10*3/uL (ref 0.7–3.1)
Lymphs: 25 %
MCH: 29.5 pg (ref 26.6–33.0)
MCHC: 33.8 g/dL (ref 31.5–35.7)
MCV: 87 fL (ref 79–97)
MONOS ABS: 0.7 10*3/uL (ref 0.1–0.9)
Monocytes: 12 %
NEUTROS PCT: 61 %
Neutrophils Absolute: 3.6 10*3/uL (ref 1.4–7.0)
PLATELETS: 212 10*3/uL (ref 150–450)
RBC: 4.68 x10E6/uL (ref 3.77–5.28)
RDW: 13.2 % (ref 11.7–15.4)
WBC: 5.8 10*3/uL (ref 3.4–10.8)

## 2018-10-12 LAB — CMP14+EGFR
ALK PHOS: 61 IU/L (ref 39–117)
ALT: 16 IU/L (ref 0–32)
AST: 20 IU/L (ref 0–40)
Albumin/Globulin Ratio: 1.8 (ref 1.2–2.2)
Albumin: 4.6 g/dL (ref 3.8–4.8)
BUN/Creatinine Ratio: 9 (ref 9–23)
BUN: 7 mg/dL (ref 6–24)
Bilirubin Total: 0.7 mg/dL (ref 0.0–1.2)
CALCIUM: 9.9 mg/dL (ref 8.7–10.2)
CO2: 21 mmol/L (ref 20–29)
CREATININE: 0.82 mg/dL (ref 0.57–1.00)
Chloride: 103 mmol/L (ref 96–106)
GFR calc Af Amer: 104 mL/min/{1.73_m2} (ref >59)
GFR, EST NON AFRICAN AMERICAN: 90 mL/min/{1.73_m2} (ref >59)
GLOBULIN, TOTAL: 2.6 g/dL (ref 1.5–4.5)
GLUCOSE: 74 mg/dL (ref 65–99)
Potassium: 4.2 mmol/L (ref 3.5–5.2)
Sodium: 140 mmol/L (ref 134–144)
Total Protein: 7.2 g/dL (ref 6.0–8.5)

## 2018-10-12 LAB — TSH: TSH: 1.01 u[IU]/mL (ref 0.450–4.500)

## 2018-10-13 NOTE — Progress Notes (Signed)
BP 125/77   Pulse 70   Temp 98.5 F (36.9 C) (Oral)   Ht 5' 5"  (1.651 m)   Wt 124 lb 3.2 oz (56.3 kg)   BMI 20.67 kg/m    Subjective:    Patient ID: Brandy Vega, female    DOB: [birth year], 43 y.o.   MRN: [id number]  HPI: Brandy Vega is a 43 y.o. female presenting on 10/11/2018 for Hypertension (4 week rck)  This patient comes in for periodic recheck on medications and conditions including hypertension and depression.  Depression screen [facility] 2/9 10/11/2018 09/02/2018 05/14/2018 04/30/2018 03/07/2018  Decreased Interest 0 1 3 1 3   Down, Depressed, Hopeless 0 1 3 1 3   PHQ - 2 Score 0 2 6 2 6   Altered sleeping - 1 3 1  0  Tired, decreased energy - 1 3 1 3   Change in appetite - 1 3 0 2  Feeling bad or failure about yourself  - 1 3 1  0  Trouble concentrating - 1 3 0 3  Moving slowly or fidgety/restless - 1 3 0 3  Suicidal thoughts - 0 3 0 2  PHQ-9 Score - 8 27 5 19   Difficult doing work/chores - - - Not difficult at all -      All medications are reviewed today. There are no reports of any problems with the medications. All of the medical conditions are reviewed and updated.  Lab work is reviewed and will be ordered as medically necessary. There are no new problems reported with today's visit.   Past Medical History:  Diagnosis Date  . Essential hypertension 09/05/2018  . MDD (major depressive disorder), recurrent episode, severe ([place]) 03/18/2018   Relevant past medical, surgical, family and social history reviewed and updated as indicated. Interim medical history since our last visit reviewed. Allergies and medications reviewed and updated. DATA REVIEWED: CHART IN EPIC  Family History reviewed for pertinent findings.  Review of Systems  Constitutional: Negative.   HENT: Negative.   Eyes: Negative.   Respiratory: Negative.   Gastrointestinal: Negative.   Genitourinary: Negative.     Allergies as of 10/11/2018   No Known Allergies     Medication List       Accurate as  of October 11, 2018 11:59 PM. Always use your most recent med list.        citalopram 20 MG tablet Commonly known as:  CELEXA Take 1 tablet (20 mg total) by mouth daily. For depression   propranolol 10 MG tablet Commonly known as:  INDERAL Take 1 tablet (10 mg total) by mouth 3 (three) times daily as needed. for anxiety   spironolactone 25 MG tablet Commonly known as:  ALDACTONE Take 1 tablet (25 mg total) by mouth daily.   traZODone 50 MG tablet Commonly known as:  DESYREL Take 1 tablet (50 mg total) by mouth at bedtime as needed for sleep.          Objective:    BP 125/77   Pulse 70   Temp 98.5 F (36.9 C) (Oral)   Ht 5' 5"  (1.651 m)   Wt 124 lb 3.2 oz (56.3 kg)   BMI 20.67 kg/m   No Known Allergies  Wt Readings from Last 3 Encounters:  10/11/18 124 lb 3.2 oz (56.3 kg)  09/02/18 122 lb 3.2 oz (55.4 kg)  08/24/18 119 lb (54 kg)    Physical Exam Constitutional:      Appearance: She is well-developed.  HENT:     Head:  Normocephalic and atraumatic.  Eyes:     Conjunctiva/sclera: Conjunctivae normal.     Pupils: Pupils are equal, round, and reactive to light.  Cardiovascular:     Rate and Rhythm: Normal rate and regular rhythm.     Heart sounds: Normal heart sounds.  Pulmonary:     Effort: Pulmonary effort is normal.     Breath sounds: Normal breath sounds.  Abdominal:     General: Bowel sounds are normal.     Palpations: Abdomen is soft.  Skin:    General: Skin is warm and dry.     Findings: No rash.  Neurological:     Mental Status: She is alert and oriented to person, place, and time.     Deep Tendon Reflexes: Reflexes are normal and symmetric.  Psychiatric:        Behavior: Behavior normal.        Thought Content: Thought content normal.        Judgment: Judgment normal.     Results for orders placed or performed in visit on 10/11/18  CBC with Differential/Platelet  Result Value Ref Range   WBC 5.8 3.4 - 10.8 x10E3/uL   RBC 4.68 3.77 - 5.28  x10E6/uL   Hemoglobin 13.8 11.1 - 15.9 g/dL   Hematocrit 40.8 34.0 - 46.6 %   MCV 87 79 - 97 fL   MCH 29.5 26.6 - 33.0 pg   MCHC 33.8 31.5 - 35.7 g/dL   RDW 13.2 11.7 - 15.4 %   Platelets 212 150 - 450 x10E3/uL   Neutrophils 61 Not Estab. %   Lymphs 25 Not Estab. %   Monocytes 12 Not Estab. %   Eos 1 Not Estab. %   Basos 1 Not Estab. %   Neutrophils Absolute 3.6 1.4 - 7.0 x10E3/uL   Lymphocytes Absolute 1.5 0.7 - 3.1 x10E3/uL   Monocytes Absolute 0.7 0.1 - 0.9 x10E3/uL   EOS (ABSOLUTE) 0.1 0.0 - 0.4 x10E3/uL   Basophils Absolute 0.1 0.0 - 0.2 x10E3/uL   Immature Granulocytes 0 Not Estab. %   Immature Grans (Abs) 0.0 0.0 - 0.1 x10E3/uL  CMP14+EGFR  Result Value Ref Range   Glucose 74 65 - 99 mg/dL   BUN 7 6 - 24 mg/dL   Creatinine, Ser 0.82 0.57 - 1.00 mg/dL   GFR calc non Af Amer 90 >59 mL/min/1.73   GFR calc Af Amer 104 >59 mL/min/1.73   BUN/Creatinine Ratio 9 9 - 23   Sodium 140 134 - 144 mmol/L   Potassium 4.2 3.5 - 5.2 mmol/L   Chloride 103 96 - 106 mmol/L   CO2 21 20 - 29 mmol/L   Calcium 9.9 8.7 - 10.2 mg/dL   Total Protein 7.2 6.0 - 8.5 g/dL   Albumin 4.6 3.8 - 4.8 g/dL   Globulin, Total 2.6 1.5 - 4.5 g/dL   Albumin/Globulin Ratio 1.8 1.2 - 2.2   Bilirubin Total 0.7 0.0 - 1.2 mg/dL   Alkaline Phosphatase 61 39 - 117 IU/L   AST 20 0 - 40 IU/L   ALT 16 0 - 32 IU/L  TSH  Result Value Ref Range   TSH 1.010 0.450 - 4.500 uIU/mL      Assessment & Plan:   1. Essential hypertension - CBC with Differential/Platelet - CMP14+EGFR - TSH  2. Severe episode of recurrent major depressive disorder, without psychotic features ([place]) Continue medications   Continue all other maintenance medications as listed above.  Follow up plan: Return in about 6 months (  around 04/11/2019) for recheck labs.  Educational handout given for [place] PA-C [facility] [address]  [place], [place] [address] [phone]   10/13/2018, 10:22  PM

## 2018-11-05 ENCOUNTER — Ambulatory Visit (INDEPENDENT_AMBULATORY_CARE_PROVIDER_SITE_OTHER): Payer: Managed Care, Other (non HMO) | Admitting: Physician Assistant

## 2018-11-05 ENCOUNTER — Encounter: Payer: Self-pay | Admitting: Physician Assistant

## 2018-11-05 VITALS — BP 118/74 | HR 105 | Temp 103.3°F | Ht 65.0 in | Wt 125.6 lb

## 2018-11-05 DIAGNOSIS — R52 Pain, unspecified: Secondary | ICD-10-CM

## 2018-11-05 DIAGNOSIS — J101 Influenza due to other identified influenza virus with other respiratory manifestations: Secondary | ICD-10-CM | POA: Diagnosis not present

## 2018-11-05 LAB — VERITOR FLU A/B WAIVED
INFLUENZA A: POSITIVE — AB
INFLUENZA B: NEGATIVE

## 2018-11-05 MED ORDER — OSELTAMIVIR PHOSPHATE 75 MG PO CAPS: 75.0000 mg | ORAL_CAPSULE | Freq: Two times a day (BID) | ORAL | 0 refills | Status: DC

## 2018-11-05 NOTE — Progress Notes (Signed)
BP 118/74   Pulse (!) 105   Temp (!) 103.3 F (39.6 C) (Oral)   Ht 5\' 5"  (1.651 m)   Wt 125 lb 9.6 oz (57 kg)   BMI 20.90 kg/m    Subjective:    Patient ID: Brandy Vega, female    DOB: 1976/03/31, 43 y.o.   MRN: [id number]  HPI: Brandy Vega is a 44 y.o. female presenting on 11/05/2018 for Generalized Body Aches; Chills; and Cough  This patient has had less than 2 days severe fever, chills, myalgias.  Complains of sinus headache and postnasal drainage. There is copious drainage at times. Associated sore throat, decreased appetite and headache.  Has been exposed to influenza.    Past Medical History:  Diagnosis Date  . Essential hypertension 09/05/2018  . MDD (major depressive disorder), recurrent episode, severe (HCC) 03/18/2018   Relevant past medical, surgical, family and social history reviewed and updated as indicated. Interim medical history since our last visit reviewed. Allergies and medications reviewed and updated. DATA REVIEWED: CHART IN EPIC  Family History reviewed for pertinent findings.  Review of Systems  Constitutional: Positive for appetite change, chills, fatigue and fever. Negative for activity change.  HENT: Positive for congestion, postnasal drip and sore throat.   Eyes: Negative.   Respiratory: Negative for cough and wheezing.   Cardiovascular: Negative.  Negative for chest pain, palpitations and leg swelling.  Gastrointestinal: Negative.   Genitourinary: Negative.   Musculoskeletal: Positive for myalgias.  Skin: Negative.   Neurological: Positive for headaches.    Allergies as of 11/05/2018   No Known Allergies     Medication List       Accurate as of November 05, 2018  9:32 PM. Always use your most recent med list.        albuterol (2.5 MG/3ML) 0.083% nebulizer solution Commonly known as:  PROVENTIL Inhale into the lungs.   azithromycin 250 MG tablet Commonly known as:  ZITHROMAX Take 2 tablets (500 mg) on  Day 1,  followed by 1 tablet  (250 mg) once daily on Days 2 through 5.   benzonatate 100 MG capsule Commonly known as:  TESSALON Take by mouth.   citalopram 20 MG tablet Commonly known as:  CELEXA Take 1 tablet (20 mg total) by mouth daily. For depression   oseltamivir 75 MG capsule Commonly known as:  Tamiflu Take 1 capsule (75 mg total) by mouth 2 (two) times daily.   predniSONE 10 MG tablet Commonly known as:  DELTASONE Take 4 po qd x 2d then 3 po qd x 2d then 2 po qd x 2d then 1 po qd x 2d then stop   propranolol 10 MG tablet Commonly known as:  INDERAL Take 1 tablet (10 mg total) by mouth 3 (three) times daily as needed. for anxiety   spironolactone 25 MG tablet Commonly known as:  Aldactone Take 1 tablet (25 mg total) by mouth daily.   traZODone 50 MG tablet Commonly known as:  DESYREL Take 1 tablet (50 mg total) by mouth at bedtime as needed for sleep.          Objective:    BP 118/74   Pulse (!) 105   Temp (!) 103.3 F (39.6 C) (Oral)   Ht 5\' 5"  (1.651 m)   Wt 125 lb 9.6 oz (57 kg)   BMI 20.90 kg/m   No Known Allergies  Wt Readings from Last 3 Encounters:  11/05/18 125 lb 9.6 oz (57 kg)  10/11/18 124 lb  3.2 oz (56.3 kg)  09/02/18 122 lb 3.2 oz (55.4 kg)    Physical Exam Vitals signs and nursing note reviewed.  Constitutional:      General: She is not in acute distress.    Appearance: She is well-developed.  HENT:     Head: Normocephalic and atraumatic.     Right Ear: Tympanic membrane normal.     Left Ear: Tympanic membrane normal.     Nose: Mucosal edema and rhinorrhea present.     Right Sinus: No frontal sinus tenderness.     Left Sinus: No frontal sinus tenderness.     Mouth/Throat:     Pharynx: Posterior oropharyngeal erythema present. No oropharyngeal exudate.     Tonsils: No tonsillar abscesses.  Eyes:     Conjunctiva/sclera: Conjunctivae normal.     Pupils: Pupils are equal, round, and reactive to light.  Neck:     Musculoskeletal: Normal range of motion.    Cardiovascular:     Rate and Rhythm: Normal rate and regular rhythm.     Pulses: Normal pulses.     Heart sounds: Normal heart sounds.  Pulmonary:     Effort: Pulmonary effort is normal. No respiratory distress.     Breath sounds: Normal breath sounds.  Abdominal:     General: Bowel sounds are normal.     Palpations: Abdomen is soft.  Skin:    General: Skin is warm and dry.     Findings: No rash.  Neurological:     Mental Status: She is alert and oriented to person, place, and time.     Deep Tendon Reflexes: Reflexes are normal and symmetric.  Psychiatric:        Behavior: Behavior normal.        Thought Content: Thought content normal.        Judgment: Judgment normal.     Results for orders placed or performed in visit on 11/05/18  Veritor Flu A/B Waived  Result Value Ref Range   Influenza A Positive (A) Negative   Influenza B Negative Negative      Assessment & Plan:   1. Body aches - Veritor Flu A/B Waived  2. Influenza A - oseltamivir (TAMIFLU) 75 MG capsule; Take 1 capsule (75 mg total) by mouth 2 (two) times daily.  Dispense: 10 capsule; Refill: 0   Continue all other maintenance medications as listed above.  Follow up plan: Return if symptoms worsen or fail to improve.  Educational handout given for survey  Remus Loffler PA-C [facility] [address]  [place], [place] [address] [phone]   11/05/2018, 9:32 PM

## 2019-01-01 ENCOUNTER — Telehealth: Payer: Self-pay | Admitting: Physician Assistant

## 2019-02-03 ENCOUNTER — Other Ambulatory Visit: Payer: Self-pay | Admitting: Physician Assistant

## 2019-02-03 DIAGNOSIS — I10 Essential (primary) hypertension: Secondary | ICD-10-CM

## 2019-02-05 ENCOUNTER — Other Ambulatory Visit: Payer: Self-pay | Admitting: Physician Assistant

## 2019-02-05 DIAGNOSIS — I10 Essential (primary) hypertension: Secondary | ICD-10-CM

## 2019-03-13 DIAGNOSIS — Z029 Encounter for administrative examinations, unspecified: Secondary | ICD-10-CM

## 2019-03-25 ENCOUNTER — Other Ambulatory Visit: Payer: Self-pay

## 2019-03-26 ENCOUNTER — Ambulatory Visit (INDEPENDENT_AMBULATORY_CARE_PROVIDER_SITE_OTHER): Payer: Managed Care, Other (non HMO) | Admitting: Physician Assistant

## 2019-03-26 ENCOUNTER — Encounter: Payer: Self-pay | Admitting: Physician Assistant

## 2019-03-26 VITALS — BP 125/77 | HR 71 | Temp 98.0°F | Ht 65.0 in | Wt 118.0 lb

## 2019-03-26 DIAGNOSIS — I10 Essential (primary) hypertension: Secondary | ICD-10-CM | POA: Diagnosis not present

## 2019-03-26 DIAGNOSIS — F339 Major depressive disorder, recurrent, unspecified: Secondary | ICD-10-CM

## 2019-03-26 DIAGNOSIS — Z1231 Encounter for screening mammogram for malignant neoplasm of breast: Secondary | ICD-10-CM

## 2019-03-26 DIAGNOSIS — F1020 Alcohol dependence, uncomplicated: Secondary | ICD-10-CM | POA: Diagnosis not present

## 2019-03-26 DIAGNOSIS — N938 Other specified abnormal uterine and vaginal bleeding: Secondary | ICD-10-CM

## 2019-03-26 DIAGNOSIS — F332 Major depressive disorder, recurrent severe without psychotic features: Secondary | ICD-10-CM | POA: Diagnosis not present

## 2019-03-26 MED ORDER — PROPRANOLOL HCL 10 MG PO TABS: 10.0000 mg | ORAL_TABLET | Freq: Three times a day (TID) | ORAL | 11 refills | Status: DC | PRN

## 2019-03-26 MED ORDER — SPIRONOLACTONE 25 MG PO TABS: 25.0000 mg | ORAL_TABLET | Freq: Every day | ORAL | 3 refills | Status: DC

## 2019-03-26 MED ORDER — TRAZODONE HCL 50 MG PO TABS: 50.0000 mg | ORAL_TABLET | Freq: Every evening | ORAL | 11 refills | Status: DC | PRN

## 2019-03-27 NOTE — Progress Notes (Signed)
BP 125/77   Pulse 71   Temp 98 F (36.7 C) (Oral)   Ht 5\' 5"  (1.651 m)   Wt 118 lb (53.5 kg)   BMI 19.64 kg/m    Subjective:    Patient ID: Brandy Vega, female    DOB: September 04, 1975, 43 y.o.   MRN: [id number]  HPI: Brandy Vega is a 43 y.o. female presenting on 03/26/2019 for Hypertension  This patient comes in for recheck on her chronic medical conditions and refill on medications.  Her medical history does include depression, anxiety, insomnia, history of alcohol abuse recover, tachycardia, hypertension.  She still has some episodes where her heart will race significantly.  And she is taking Inderal.  She generally takes it 3 times a day but occasionally misses it.  Episodes are mainly happening if she gets too hot when she is working.  Her work environment is as just described.  Otherwise she seems to be doing well with everything else.  Past Medical History:  Diagnosis Date  . Essential hypertension 09/05/2018  . MDD (major depressive disorder), recurrent episode, severe ([place]) 03/18/2018   Relevant past medical, surgical, family and social history reviewed and updated as indicated. Interim medical history since our last visit reviewed. Allergies and medications reviewed and updated. DATA REVIEWED: CHART IN EPIC  Family History reviewed for pertinent findings.  Review of Systems  Constitutional: Negative.   HENT: Negative.   Eyes: Negative.   Respiratory: Negative.   Gastrointestinal: Negative.   Genitourinary: Negative.   Psychiatric/Behavioral: Positive for dysphoric mood and sleep disturbance. Negative for decreased concentration, self-injury and suicidal ideas. The patient is nervous/anxious.     Allergies as of 03/26/2019   No Known Allergies     Medication List       Accurate as of March 26, 2019 11:59 PM. If you have any questions, ask your nurse or doctor.        STOP taking these medications   azithromycin 250 MG tablet Commonly known as: ZITHROMAX  Stopped by: Terald Sleeper, PA-C   benzonatate 100 MG capsule Commonly known as: TESSALON Stopped by: Terald Sleeper, PA-C   citalopram 20 MG tablet Commonly known as: CELEXA Stopped by: Terald Sleeper, PA-C   oseltamivir 75 MG capsule Commonly known as: Tamiflu Stopped by: Terald Sleeper, PA-C   predniSONE 10 MG tablet Commonly known as: DELTASONE Stopped by: Terald Sleeper, PA-C     TAKE these medications   albuterol (2.5 MG/3ML) 0.083% nebulizer solution Commonly known as: PROVENTIL Inhale into the lungs.   Melatonin 10 MG Tabs Take by mouth.   propranolol 10 MG tablet Commonly known as: INDERAL Take 1 tablet (10 mg total) by mouth 3 (three) times daily as needed. for anxiety   spironolactone 25 MG tablet Commonly known as: ALDACTONE Take 1 tablet (25 mg total) by mouth daily.   traZODone 50 MG tablet Commonly known as: DESYREL Take 1 tablet (50 mg total) by mouth at bedtime as needed for sleep.          Objective:    BP 125/77   Pulse 71   Temp 98 F (36.7 C) (Oral)   Ht 5\' 5"  (1.651 m)   Wt 118 lb (53.5 kg)   BMI 19.64 kg/m   No Known Allergies  Wt Readings from Last 3 Encounters:  03/26/19 118 lb (53.5 kg)  11/05/18 125 lb 9.6 oz (57 kg)  10/11/18 124 lb 3.2 oz (56.3 kg)    Physical  Exam Constitutional:      Appearance: She is well-developed.  HENT:     Head: Normocephalic and atraumatic.  Eyes:     Conjunctiva/sclera: Conjunctivae normal.     Pupils: Pupils are equal, round, and reactive to light.  Cardiovascular:     Rate and Rhythm: Normal rate and regular rhythm.     Heart sounds: Normal heart sounds.  Pulmonary:     Effort: Pulmonary effort is normal.     Breath sounds: Normal breath sounds.  Abdominal:     General: Bowel sounds are normal.     Palpations: Abdomen is soft.  Skin:    General: Skin is warm and dry.     Findings: No rash.  Neurological:     Mental Status: She is alert and oriented to person, place, and time.      Deep Tendon Reflexes: Reflexes are normal and symmetric.  Psychiatric:        Behavior: Behavior normal.        Thought Content: Thought content normal.        Judgment: Judgment normal.         Assessment & Plan:   1. Essential hypertension - spironolactone (ALDACTONE) 25 MG tablet; Take 1 tablet (25 mg total) by mouth daily.  Dispense: 90 tablet; Refill: 3  2. Severe episode of recurrent major depressive disorder, without psychotic features (HCC) - propranolol (INDERAL) 10 MG tablet; Take 1 tablet (10 mg total) by mouth 3 (three) times daily as needed. for anxiety  Dispense: 60 tablet; Refill: 11  3. Alcohol use disorder, severe, dependence (HCC) Still recovered excited about sobriety  4. DUB (dysfunctional uterine bleeding) Continue medications  5. Depression, recurrent (HCC) - Melatonin 10 MG TABS; Take by mouth. - traZODone (DESYREL) 50 MG tablet; Take 1 tablet (50 mg total) by mouth at bedtime as needed for sleep.  Dispense: 30 tablet; Refill: 6911  FMLA is written for her depression, anxiety, history of alcohol abuse recovered, hypertension and tachycardia  Continue all other maintenance medications as listed above.  Follow up plan: Return in about 4 weeks (around 04/23/2019) for female exam.  Educational handout given for survey  Remus LofflerAngel S. Glennis Montenegro PA-C [facility] [address]  [place], [place] [id number] [phone]   03/27/2019, 6:16 PM

## 2019-06-25 ENCOUNTER — Encounter: Payer: Self-pay | Admitting: Physician Assistant

## 2019-06-25 ENCOUNTER — Ambulatory Visit (INDEPENDENT_AMBULATORY_CARE_PROVIDER_SITE_OTHER): Payer: Managed Care, Other (non HMO) | Admitting: Physician Assistant

## 2019-06-25 DIAGNOSIS — R739 Hyperglycemia, unspecified: Secondary | ICD-10-CM

## 2019-06-25 DIAGNOSIS — Z0000 Encounter for general adult medical examination without abnormal findings: Secondary | ICD-10-CM

## 2019-06-25 DIAGNOSIS — F411 Generalized anxiety disorder: Secondary | ICD-10-CM

## 2019-06-25 DIAGNOSIS — I10 Essential (primary) hypertension: Secondary | ICD-10-CM | POA: Diagnosis not present

## 2019-06-25 DIAGNOSIS — F321 Major depressive disorder, single episode, moderate: Secondary | ICD-10-CM | POA: Diagnosis not present

## 2019-06-25 HISTORY — DX: Generalized anxiety disorder: F41.1

## 2019-06-25 MED ORDER — ARIPIPRAZOLE 2 MG PO TABS: 2.0000 mg | ORAL_TABLET | Freq: Every day | ORAL | 2 refills | Status: DC

## 2019-06-25 MED ORDER — ESCITALOPRAM OXALATE 10 MG PO TABS: 10.0000 mg | ORAL_TABLET | Freq: Every day | ORAL | 5 refills | Status: DC

## 2019-06-26 ENCOUNTER — Telehealth: Payer: Self-pay | Admitting: Physician Assistant

## 2019-06-26 NOTE — Telephone Encounter (Signed)
Pt aware.

## 2019-06-26 NOTE — Telephone Encounter (Signed)
Ok to do work note

## 2019-06-26 NOTE — Telephone Encounter (Signed)
Covering PCP- Not in chart.

## 2019-06-26 NOTE — Telephone Encounter (Signed)
Letter printed and faxed as requested. NVM

## 2019-06-30 ENCOUNTER — Encounter: Payer: Self-pay | Admitting: Physician Assistant

## 2019-06-30 NOTE — Progress Notes (Signed)
  Telephone visit  Subjective: CC:recheck chronic conditions PCP: ,  S, PA-C HPI:Brandy Vega is a 43 y.o. female calls for telephone consult today. Patient provides verbal consent for consult held via phone.  Patient is identified with 2 separate identifiers.  At this time the entire area is on COVID-19 social distancing and stay home orders are in place.  Patient is of higher risk and therefore we are performing this by a virtual method.  Location of patient: Home Location of provider: HOME Others present for call: No  This patient is having a follow-up for her chronic medical conditions that do include hypertension, depression, generalized anxiety.  She reports that her blood pressure readings at home have all been 120/80 and under.  She denies having any chest pain, palpitations. The patient does have depression and anxiety.  She does need to have labs performed.  At this time she does need some refills performed for her medications.  And these will be sent into her pharmacy.   ROS: Per HPI  No Known Allergies Past Medical History:  Diagnosis Date  . Essential hypertension 09/05/2018  . GAD (generalized anxiety disorder) 06/25/2019  . MDD (major depressive disorder), recurrent episode, severe (HCC) 03/18/2018    Current Outpatient Medications:  .  albuterol (PROVENTIL) (2.5 MG/3ML) 0.083% nebulizer solution, Inhale into the lungs., Disp: , Rfl:  .  ARIPiprazole (ABILIFY) 2 MG tablet, Take 1 tablet (2 mg total) by mouth daily., Disp: 30 tablet, Rfl: 2 .  escitalopram (LEXAPRO) 10 MG tablet, Take 1 tablet (10 mg total) by mouth daily., Disp: 30 tablet, Rfl: 5 .  Melatonin 10 MG TABS, Take by mouth., Disp: , Rfl:  .  propranolol (INDERAL) 10 MG tablet, Take 1 tablet (10 mg total) by mouth 3 (three) times daily as needed. for anxiety, Disp: 60 tablet, Rfl: 11 .  spironolactone (ALDACTONE) 25 MG tablet, Take 1 tablet (25 mg total) by mouth daily., Disp: 90 tablet, Rfl: 3  .  traZODone (DESYREL) 50 MG tablet, Take 1 tablet (50 mg total) by mouth at bedtime as needed for sleep., Disp: 30 tablet, Rfl: 11  Assessment/ Plan: 43 y.o. female   1. Depression, major, single episode, moderate (HCC) - escitalopram (LEXAPRO) 10 MG tablet; Take 1 tablet (10 mg total) by mouth daily.  Dispense: 30 tablet; Refill: 5 - ARIPiprazole (ABILIFY) 2 MG tablet; Take 1 tablet (2 mg total) by mouth daily.  Dispense: 30 tablet; Refill: 2  2. GAD (generalized anxiety disorder) - escitalopram (LEXAPRO) 10 MG tablet; Take 1 tablet (10 mg total) by mouth daily.  Dispense: 30 tablet; Refill: 5  3. Well adult exam - CMP14+EGFR; Future - CBC with Differential/Platelet; Future - Lipid panel; Future - TSH; Future - Bayer DCA Hb A1c Waived; Future  4. Hyperglycemia - CMP14+EGFR; Future - Bayer DCA Hb A1c Waived; Future  5. Essential hypertension - CMP14+EGFR; Future - CBC with Differential/Platelet; Future - Lipid panel; Future - TSH; Future - Bayer DCA Hb A1c Waived; Future   Return in about 3 weeks (around 07/16/2019).  Continue all other maintenance medications as listed above.  Start time: 3:40 PM End time: 3:52 PM  Meds ordered this encounter  Medications  . escitalopram (LEXAPRO) 10 MG tablet    Sig: Take 1 tablet (10 mg total) by mouth daily.    Dispense:  30 tablet    Refill:  5    Order Specific Question:   Supervising Provider    Answer:   ,    S [[id number]]  . ARIPiprazole (ABILIFY) 2 MG tablet    Sig: Take 1 tablet (2 mg total) by mouth daily.    Dispense:  30 tablet    Refill:  2    Order Specific Question:   Supervising Provider    Answer:   Terald Sleeper [[id number]]    Particia Nearing PA-C [place] [phone]

## 2019-07-02 NOTE — Progress Notes (Signed)
Patient states she will call us back to schedule.

## 2019-08-07 ENCOUNTER — Other Ambulatory Visit: Payer: Self-pay

## 2019-08-08 ENCOUNTER — Encounter: Payer: Self-pay | Admitting: Physician Assistant

## 2019-08-08 ENCOUNTER — Encounter: Payer: Managed Care, Other (non HMO) | Admitting: Physician Assistant

## 2019-09-05 ENCOUNTER — Other Ambulatory Visit: Payer: Self-pay

## 2019-09-08 ENCOUNTER — Encounter: Payer: Self-pay | Admitting: Physician Assistant

## 2019-09-08 ENCOUNTER — Ambulatory Visit (INDEPENDENT_AMBULATORY_CARE_PROVIDER_SITE_OTHER): Payer: 59 | Admitting: Physician Assistant

## 2019-09-08 ENCOUNTER — Other Ambulatory Visit: Payer: Self-pay

## 2019-09-08 VITALS — BP 128/89 | HR 88 | Temp 97.0°F | Ht 65.0 in | Wt 120.6 lb

## 2019-09-08 DIAGNOSIS — G8929 Other chronic pain: Secondary | ICD-10-CM | POA: Diagnosis not present

## 2019-09-08 DIAGNOSIS — M25511 Pain in right shoulder: Secondary | ICD-10-CM | POA: Diagnosis not present

## 2019-09-08 DIAGNOSIS — M542 Cervicalgia: Secondary | ICD-10-CM

## 2019-09-08 MED ORDER — METHOCARBAMOL 500 MG PO TABS: 500.0000 mg | ORAL_TABLET | Freq: Three times a day (TID) | ORAL | 0 refills | Status: DC | PRN

## 2019-09-08 MED ORDER — IBUPROFEN 800 MG PO TABS: 800.0000 mg | ORAL_TABLET | Freq: Three times a day (TID) | ORAL | 0 refills | Status: DC | PRN

## 2019-09-08 MED ORDER — PREDNISONE 10 MG (21) PO TBPK: ORAL_TABLET | ORAL | 0 refills | Status: DC

## 2019-09-08 NOTE — Patient Instructions (Addendum)
shoulder Neck Exercises Ask your health care provider which exercises are safe for you. Do exercises exactly as told by your health care provider and adjust them as directed. It is normal to feel mild stretching, pulling, tightness, or discomfort as you do these exercises. Stop right away if you feel sudden pain or your pain gets worse. Do not begin these exercises until told by your health care provider. Neck exercises can be important for many reasons. They can improve strength and maintain flexibility in your neck, which will help your upper back and prevent neck pain. Stretching exercises Rotation neck stretching  1. Sit in a chair or stand up. 2. Place your feet flat on the floor, shoulder width apart. 3. Slowly turn your head (rotate) to the right until a slight stretch is felt. Turn it all the way to the right so you can look over your right shoulder. Do not tilt or tip your head. 4. Hold this position for 10-30 seconds. 5. Slowly turn your head (rotate) to the left until a slight stretch is felt. Turn it all the way to the left so you can look over your left shoulder. Do not tilt or tip your head. 6. Hold this position for 10-30 seconds. Repeat __________ times. Complete this exercise __________ times a day. Neck retraction 1. Sit in a sturdy chair or stand up. 2. Look straight ahead. Do not bend your neck. 3. Use your fingers to push your chin backward (retraction). Do not bend your neck for this movement. Continue to face straight ahead. If you are doing the exercise properly, you will feel a slight sensation in your throat and a stretch at the back of your neck. 4. Hold the stretch for 1-2 seconds. Repeat __________ times. Complete this exercise __________ times a day. Strengthening exercises Neck press 1. Lie on your back on a firm bed or on the floor with a pillow under your head. 2. Use your neck muscles to push your head down on the pillow and straighten your spine. 3. Hold the  position as well as you can. Keep your head facing up (in a neutral position) and your chin tucked. 4. Slowly count to 5 while holding this position. Repeat __________ times. Complete this exercise __________ times a day. Isometrics These are exercises in which you strengthen the muscles in your neck while keeping your neck still (isometrics). 1. Sit in a supportive chair and place your hand on your forehead. 2. Keep your head and face facing straight ahead. Do not flex or extend your neck while doing isometrics. 3. Push forward with your head and neck while pushing back with your hand. Hold for 10 seconds. 4. Do the sequence again, this time putting your hand against the back of your head. Use your head and neck to push backward against the hand pressure. 5. Finally, do the same exercise on either side of your head, pushing sideways against the pressure of your hand. Repeat __________ times. Complete this exercise __________ times a day. Prone head lifts 1. Lie face-down (prone position), resting on your elbows so that your chest and upper back are raised. 2. Start with your head facing downward, near your chest. Position your chin either on or near your chest. 3. Slowly lift your head upward. Lift until you are looking straight ahead. Then continue lifting your head as far back as you can comfortably stretch. 4. Hold your head up for 5 seconds. Then slowly lower it to your starting position. Repeat  __________ times. Complete this exercise __________ times a day. Supine head lifts 1. Lie on your back (supine position), bending your knees to point to the ceiling and keeping your feet flat on the floor. 2. Lift your head slowly off the floor, raising your chin toward your chest. 3. Hold for 5 seconds. Repeat __________ times. Complete this exercise __________ times a day. Scapular retraction 1. Stand with your arms at your sides. Look straight ahead. 2. Slowly pull both shoulders (scapulae)  backward and downward (retraction) until you feel a stretch between your shoulder blades in your upper back. 3. Hold for 10-30 seconds. 4. Relax and repeat. Repeat __________ times. Complete this exercise __________ times a day. Contact a health care provider if:  Your neck pain or discomfort gets much worse when you do an exercise.  Your neck pain or discomfort does not improve within 2 hours after you exercise. If you have any of these problems, stop exercising right away. Do not do the exercises again unless your health care provider says that you can. Get help right away if:  You develop sudden, severe neck pain. If this happens, stop exercising right away. Do not do the exercises again unless your health care provider says that you can. This information is not intended to replace advice given to you by your health care provider. Make sure you discuss any questions you have with your health care provider. Document Revised: 06/12/2018 Document Reviewed: 06/12/2018 Elsevier Patient Education  2020 [employer].

## 2019-09-12 NOTE — Progress Notes (Signed)
Acute Office Visit  Subjective:    Patient ID: Brandy Vega, female    DOB: Nov 13, 1975, 44 y.o.   MRN: [id number]  Chief Complaint  Patient presents with  . Back Pain  . Shoulder Pain    Back Pain This is a new problem. The current episode started in the past 7 days. The problem occurs constantly. The problem has been rapidly worsening since onset. The pain is present in the lumbar spine. The symptoms are aggravated by bending, lying down and sitting. Stiffness is present in the morning.  Shoulder Pain  This is a recurrent problem. The current episode started in the past 7 days. The problem occurs constantly. The pain is moderate. She has tried rest and acetaminophen for the symptoms. The treatment provided no relief.     Past Medical History:  Diagnosis Date  . Essential hypertension 09/05/2018  . GAD (generalized anxiety disorder) 06/25/2019  . MDD (major depressive disorder), recurrent episode, severe (HCC) 03/18/2018    Past Surgical History:  Procedure Laterality Date  . DILATION AND CURETTAGE OF UTERUS      Family History  Problem Relation Age of Onset  . Hypertension Mother   . Diabetes Father     Social History   Socioeconomic History  . Marital status: Single    Spouse name: Not on file  . Number of children: Not on file  . Years of education: Not on file  . Highest education level: Not on file  Occupational History  . Not on file  Tobacco Use  . Smoking status: Current Every Day Smoker    Packs/day: 0.50    Types: Cigarettes  . Smokeless tobacco: Never Used  Substance and Sexual Activity  . Alcohol use: Yes    Comment: occ. use  . Drug use: No  . Sexual activity: Yes    Birth control/protection: None  Other Topics Concern  . Not on file  Social History Narrative  . Not on file   Social Determinants of Health   Financial Resource Strain:   . Difficulty of Paying Living Expenses: Not on file  Food Insecurity:   . Worried About Brewing technologist in the Last Year: Not on file  . Ran Out of Food in the Last Year: Not on file  Transportation Needs:   . Lack of Transportation (Medical): Not on file  . Lack of Transportation (Non-Medical): Not on file  Physical Activity:   . Days of Exercise per Week: Not on file  . Minutes of Exercise per Session: Not on file  Stress:   . Feeling of Stress : Not on file  Social Connections:   . Frequency of Communication with Friends and Family: Not on file  . Frequency of Social Gatherings with Friends and Family: Not on file  . Attends Religious Services: Not on file  . Active Member of Clubs or Organizations: Not on file  . Attends Banker Meetings: Not on file  . Marital Status: Not on file  Intimate Partner Violence:   . Fear of Current or Ex-Partner: Not on file  . Emotionally Abused: Not on file  . Physically Abused: Not on file  . Sexually Abused: Not on file    Outpatient Medications Prior to Visit  Medication Sig Dispense Refill  . albuterol (PROVENTIL) (2.5 MG/3ML) 0.083% nebulizer solution Inhale into the lungs.    . ARIPiprazole (ABILIFY) 2 MG tablet Take 1 tablet (2 mg total) by mouth daily. 30 tablet 2  .  escitalopram (LEXAPRO) 10 MG tablet Take 1 tablet (10 mg total) by mouth daily. 30 tablet 5  . Melatonin 10 MG TABS Take by mouth.    . propranolol (INDERAL) 10 MG tablet Take 1 tablet (10 mg total) by mouth 3 (three) times daily as needed. for anxiety 60 tablet 11  . spironolactone (ALDACTONE) 25 MG tablet Take 1 tablet (25 mg total) by mouth daily. 90 tablet 3  . traZODone (DESYREL) 50 MG tablet Take 1 tablet (50 mg total) by mouth at bedtime as needed for sleep. 30 tablet 11   No facility-administered medications prior to visit.    No Known Allergies  Review of Systems  Constitutional: Negative.   HENT: Negative.   Eyes: Negative.   Respiratory: Negative.   Gastrointestinal: Negative.   Genitourinary: Negative.   Musculoskeletal: Positive for  arthralgias, back pain and myalgias. Negative for joint swelling.       Objective:    Physical Exam Constitutional:      General: She is not in acute distress.    Appearance: Normal appearance. She is well-developed.  HENT:     Head: Normocephalic and atraumatic.  Cardiovascular:     Rate and Rhythm: Normal rate.  Pulmonary:     Effort: Pulmonary effort is normal.  Musculoskeletal:     Right shoulder: Tenderness present. No swelling. Decreased range of motion.     Cervical back: No spasms or tenderness. Decreased range of motion.     Lumbar back: Spasms present. Decreased range of motion.  Skin:    General: Skin is warm and dry.     Findings: No rash.  Neurological:     Mental Status: She is alert and oriented to person, place, and time.     Deep Tendon Reflexes: Reflexes are normal and symmetric.     BP 128/89   Pulse 88   Temp (!) 97 F (36.1 C) (Temporal)   Ht 5\' 5"  (1.651 m)   Wt 120 lb 9.6 oz (54.7 kg)   LMP 09/08/2019   SpO2 99%   BMI 20.07 kg/m  Wt Readings from Last 3 Encounters:  09/08/19 120 lb 9.6 oz (54.7 kg)  03/26/19 118 lb (53.5 kg)  11/05/18 125 lb 9.6 oz (57 kg)    Health Maintenance Due  Topic Date Due  . HIV Screening  10/13/1992  . PAP SMEAR-Modifier  10/13/1998    There are no preventive care reminders to display for this patient.   Lab Results  Component Value Date   TSH 1.010 10/11/2018   Lab Results  Component Value Date   WBC 5.8 10/11/2018   HGB 13.8 10/11/2018   HCT 40.8 10/11/2018   MCV 87 10/11/2018   PLT 212 10/11/2018   Lab Results  Component Value Date   NA 140 10/11/2018   K 4.2 10/11/2018   CO2 21 10/11/2018   GLUCOSE 74 10/11/2018   BUN 7 10/11/2018   CREATININE 0.82 10/11/2018   BILITOT 0.7 10/11/2018   ALKPHOS 61 10/11/2018   AST 20 10/11/2018   ALT 16 10/11/2018   PROT 7.2 10/11/2018   ALBUMIN 4.6 10/11/2018   CALCIUM 9.9 10/11/2018   ANIONGAP 9 03/20/2018   No results found for: CHOL No results  found for: HDL No results found for: LDLCALC No results found for: TRIG No results found for: CHOLHDL No results found for: 03/22/2018     Assessment & Plan:   Problem List Items Addressed This Visit    None  Visit Diagnoses    Musculoskeletal neck pain    -  Primary   Relevant Medications   ibuprofen (ADVIL) 800 MG tablet   predniSONE (STERAPRED UNI-PAK 21 TAB) 10 MG (21) TBPK tablet   methocarbamol (ROBAXIN) 500 MG tablet   Chronic right shoulder pain       Relevant Medications   ibuprofen (ADVIL) 800 MG tablet   predniSONE (STERAPRED UNI-PAK 21 TAB) 10 MG (21) TBPK tablet   methocarbamol (ROBAXIN) 500 MG tablet       Meds ordered this encounter  Medications  . ibuprofen (ADVIL) 800 MG tablet    Sig: Take 1 tablet (800 mg total) by mouth every 8 (eight) hours as needed.    Dispense:  90 tablet    Refill:  0    Order Specific Question:   Supervising Provider    Answer:   Janora Norlander [[id number]]  . predniSONE (STERAPRED UNI-PAK 21 TAB) 10 MG (21) TBPK tablet    Sig: As directed x 6 days    Dispense:  21 tablet    Refill:  0    Order Specific Question:   Supervising Provider    Answer:   Janora Norlander [[id number]]  . methocarbamol (ROBAXIN) 500 MG tablet    Sig: Take 1 tablet (500 mg total) by mouth every 8 (eight) hours as needed for muscle spasms.    Dispense:  60 tablet    Refill:  0    Order Specific Question:   Supervising Provider    Answer:   Janora Norlander [[id number]]     Terald Sleeper, PA-C

## 2019-09-30 DIAGNOSIS — Z029 Encounter for administrative examinations, unspecified: Secondary | ICD-10-CM

## 2019-10-23 ENCOUNTER — Encounter: Payer: 59 | Admitting: Physician Assistant

## 2019-12-25 ENCOUNTER — Telehealth (INDEPENDENT_AMBULATORY_CARE_PROVIDER_SITE_OTHER): Payer: 59 | Admitting: Family

## 2019-12-25 ENCOUNTER — Encounter: Payer: Self-pay | Admitting: Family

## 2019-12-25 DIAGNOSIS — I10 Essential (primary) hypertension: Secondary | ICD-10-CM

## 2019-12-25 DIAGNOSIS — F339 Major depressive disorder, recurrent, unspecified: Secondary | ICD-10-CM

## 2019-12-25 DIAGNOSIS — F332 Major depressive disorder, recurrent severe without psychotic features: Secondary | ICD-10-CM

## 2019-12-25 DIAGNOSIS — F1011 Alcohol abuse, in remission: Secondary | ICD-10-CM

## 2019-12-25 DIAGNOSIS — G4700 Insomnia, unspecified: Secondary | ICD-10-CM

## 2019-12-25 DIAGNOSIS — F411 Generalized anxiety disorder: Secondary | ICD-10-CM | POA: Diagnosis not present

## 2019-12-25 DIAGNOSIS — M545 Low back pain, unspecified: Secondary | ICD-10-CM

## 2019-12-25 MED ORDER — TRAZODONE HCL 50 MG PO TABS: 50.0000 mg | ORAL_TABLET | Freq: Every evening | ORAL | 11 refills | Status: DC | PRN

## 2019-12-25 MED ORDER — PROPRANOLOL HCL 10 MG PO TABS: 10.0000 mg | ORAL_TABLET | Freq: Three times a day (TID) | ORAL | 11 refills | Status: DC | PRN

## 2019-12-25 MED ORDER — SPIRONOLACTONE 25 MG PO TABS: 25.0000 mg | ORAL_TABLET | Freq: Every day | ORAL | 3 refills | Status: DC

## 2019-12-25 MED ORDER — BACLOFEN 10 MG PO TABS: 10.0000 mg | ORAL_TABLET | Freq: Three times a day (TID) | ORAL | 2 refills | Status: DC

## 2019-12-25 NOTE — Progress Notes (Signed)
Virtual Visit via telephone Note Due to COVID-19 pandemic this visit was conducted virtually. This visit type was conducted due to national recommendations for restrictions regarding the COVID-19 Pandemic (e.g. social distancing, sheltering in place) in an effort to limit this patient's exposure and mitigate transmission in our community. All issues noted in this document were discussed and addressed.  A physical exam was not performed with this format.  I connected with Brandy Vega on 12/25/19 at 9:00 AM by telephone and video and verified that I am speaking with the correct person using two identifiers. Brandy Vega is currently located at home and boyfriend is currently with her during visit. The provider, [name], FNP is located in their office at time of visit.  I discussed the limitations, risks, security and privacy concerns of performing an evaluation and management service by telephone and the availability of in person appointments. I also discussed with the patient that there may be a patient responsible charge related to this service. The patient expressed understanding and agreed to proceed.   History and Present Illness:  Pt calls the office today for chronic follow up. She reports she has not drank alcohol since last summer! Hypertension This is a chronic problem. The current episode started more than 1 year ago. The problem has been waxing and waning since onset. The problem is controlled. Associated symptoms include anxiety, malaise/fatigue, palpitations and shortness of breath ("some times"). Pertinent negatives include no peripheral edema. Risk factors for coronary artery disease include sedentary lifestyle. Past treatments include beta blockers. The current treatment provides moderate improvement. There is no history of kidney disease, CAD/MI or heart failure.  Anxiety Presents for follow-up visit. Symptoms include excessive worry, insomnia, nervous/anxious behavior,  palpitations and shortness of breath ("some times"). The quality of sleep is good.    Nicotine Dependence Presents for follow-up visit. Symptoms include insomnia. Her urge triggers include company of smokers. The symptoms have been stable. She smokes 1 pack of cigarettes per day.  Back Pain This is a recurrent problem. The current episode started more than 1 year ago. The problem occurs intermittently. The problem has been waxing and waning since onset. The pain is present in the lumbar spine. The quality of the pain is described as aching. The pain is moderate. She has tried muscle relaxant for the symptoms. The treatment provided mild relief.  Insomnia Primary symptoms: difficulty falling asleep, frequent awakening, malaise/fatigue.  The current episode started more than one year. The onset quality is gradual. The problem occurs intermittently.      Review of Systems  Constitutional: Positive for malaise/fatigue.  Respiratory: Positive for shortness of breath ("some times").   Cardiovascular: Positive for palpitations.  Musculoskeletal: Positive for back pain.  Psychiatric/Behavioral: The patient is nervous/anxious and has insomnia.   All other systems reviewed and are negative.    Observations/Objective: No SOB or distress noted, patient looks well  Assessment and Plan: Brandy Vega comes in today with chief complaint of No chief complaint on file.   Diagnosis and orders addressed:  1. Essential hypertension - spironolactone (ALDACTONE) 25 MG tablet; Take 1 tablet (25 mg total) by mouth daily.  Dispense: 90 tablet; Refill: 3  2. History of alcohol abuse  3. Severe episode of recurrent major depressive disorder, without psychotic features ([name]) - propranolol (INDERAL) 10 MG tablet; Take 1 tablet (10 mg total) by mouth 3 (three) times daily as needed. for anxiety  Dispense: 60 tablet; Refill: 11 - traZODone (DESYREL) 50 MG tablet; Take  1 tablet (50 mg total) by mouth at  bedtime as needed for sleep.  Dispense: 30 tablet; Refill: 11  4. GAD (generalized anxiety disorder) - propranolol (INDERAL) 10 MG tablet; Take 1 tablet (10 mg total) by mouth 3 (three) times daily as needed. for anxiety  Dispense: 60 tablet; Refill: 11 - traZODone (DESYREL) 50 MG tablet; Take 1 tablet (50 mg total) by mouth at bedtime as needed for sleep.  Dispense: 30 tablet; Refill: 11  5. Low back pain, unspecified back pain laterality, unspecified chronicity, unspecified whether sciatica present - baclofen (LIORESAL) 10 MG tablet; Take 1 tablet (10 mg total) by mouth 3 (three) times daily.  Dispense: 90 each; Refill: 2  6. Depression, recurrent (HCC)  7. Insomnia, unspecified type    Labs pending Health Maintenance reviewed Diet and exercise encouraged  Follow up plan: 6 months      I discussed the assessment and treatment plan with the patient. The patient was provided an opportunity to ask questions and all were answered. The patient agreed with the plan and demonstrated an understanding of the instructions.   The patient was advised to call back or seek an in-person evaluation if the symptoms worsen or if the condition fails to improve as anticipated.  The above assessment and management plan was discussed with the patient. The patient verbalized understanding of and has agreed to the management plan. Patient is aware to call the clinic if symptoms persist or worsen. Patient is aware when to return to the clinic for a follow-up visit. Patient educated on when it is appropriate to go to the emergency department.   Time call ended:  9:19 AM  I provided 19 minutes of non and -face-to-face time during this encounter.    [name], FNP

## 2019-12-31 ENCOUNTER — Ambulatory Visit (HOSPITAL_COMMUNITY)
Admission: AD | Admit: 2019-12-31 | Discharge: 2019-12-31 | Disposition: A | Discharge status: Home / Self Care | Payer: 59 | Attending: Psychiatry | Admitting: Psychiatry

## 2019-12-31 DIAGNOSIS — F329 Major depressive disorder, single episode, unspecified: Secondary | ICD-10-CM | POA: Diagnosis present

## 2019-12-31 DIAGNOSIS — F3281 Premenstrual dysphoric disorder: Secondary | ICD-10-CM | POA: Diagnosis not present

## 2019-12-31 DIAGNOSIS — F10229 Alcohol dependence with intoxication, unspecified: Secondary | ICD-10-CM | POA: Diagnosis not present

## 2019-12-31 NOTE — H&P (Signed)
Behavioral Health Medical Screening Exam  Brandy Vega is an 44 y.o. female. She is presenting voluntarily requesting treatment for alcohol use disorder. She reports relapsing in December 2020 after one year of sobriety. She reports depressed mood but denies SI/HI/AVH.   Total Time spent with patient: 15 minutes  Psychiatric Specialty Exam: Physical Exam  Nursing note and vitals reviewed. Constitutional: She is oriented to person, place, and time. She appears well-developed and well-nourished.  Cardiovascular: Normal rate.  Respiratory: Effort normal.  Neurological: She is alert and oriented to person, place, and time.    Review of Systems  Constitutional: Negative.   Respiratory: Negative for cough and shortness of breath.   Psychiatric/Behavioral: Positive for dysphoric mood. Negative for agitation, behavioral problems, confusion, hallucinations, self-injury, sleep disturbance and suicidal ideas. The patient is not nervous/anxious and is not hyperactive.     Blood pressure 115/71, pulse 95, temperature 98.6 F (37 C), temperature source Oral, resp. rate 16, SpO2 98 %.There is no height or weight on file to calculate BMI.  General Appearance: Fairly Groomed  Eye Contact:  Good  Speech:  Normal Rate  Volume:  Normal  Mood:  Depressed  Affect:  Congruent and Tearful  Thought Process:  Coherent  Orientation:  Full (Time, Place, and Person)  Thought Content:  Logical  Suicidal Thoughts:  No  Homicidal Thoughts:  No  Memory:  Immediate;   Fair Recent;   Fair Remote;   Fair  Judgement:  Intact  Insight:  Fair  Psychomotor Activity:  Normal  Concentration: Concentration: Good and Attention Span: Fair  Recall:  [employer] of Knowledge:Fair  Language: Good  Akathisia:  No  Handed:  Right  AIMS (if indicated):     Assets:  Communication Skills Desire for Improvement Financial Resources/Insurance Housing Social Support  Sleep:       Musculoskeletal: Strength & Muscle  Tone: within normal limits Gait & Station: normal Patient leans: N/A  Blood pressure 115/71, pulse 95, temperature 98.6 F (37 C), temperature source Oral, resp. rate 16, SpO2 98 %.  Recommendations:  Based on my evaluation the patient does not appear to have an emergency medical condition.  Referrals for detox and substance use treatment.  Aldean Baker, NP 12/31/2019, 1:05 PM
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
## 2020-01-02 ENCOUNTER — Telehealth: Payer: Self-pay | Admitting: Family

## 2020-01-02 NOTE — Telephone Encounter (Signed)
Patient aware no appointment today she complains of fatigue, chills not sure if she is running a fever. She has not drank or ate anything in 2 days. Advised to go to the urgent care to get checked out and make sure she pushed plenty of fluids. Patient verbalized understanding.

## 2020-01-06 ENCOUNTER — Ambulatory Visit: Payer: 59 | Admitting: Family

## 2020-01-07 ENCOUNTER — Other Ambulatory Visit: Payer: Self-pay

## 2020-01-07 ENCOUNTER — Ambulatory Visit: Payer: 59 | Attending: Internal Medicine

## 2020-01-07 DIAGNOSIS — Z20822 Contact with and (suspected) exposure to covid-19: Secondary | ICD-10-CM

## 2020-01-08 LAB — NOVEL CORONAVIRUS, NAA: SARS-CoV-2, NAA: NOT DETECTED

## 2020-01-08 LAB — SARS-COV-2, NAA 2 DAY TAT

## 2020-02-24 ENCOUNTER — Emergency Department (HOSPITAL_COMMUNITY)
Admission: EM | Admit: 2020-02-24 | Discharge: 2020-02-24 | Disposition: A | Discharge status: Home / Self Care | Payer: 59 | Attending: Emergency Medicine | Admitting: Emergency Medicine

## 2020-02-24 ENCOUNTER — Emergency Department (HOSPITAL_COMMUNITY): Payer: 59

## 2020-02-24 ENCOUNTER — Encounter (HOSPITAL_COMMUNITY): Payer: Self-pay | Admitting: Emergency Medicine

## 2020-02-24 ENCOUNTER — Other Ambulatory Visit: Payer: Self-pay

## 2020-02-24 DIAGNOSIS — F10129 Alcohol abuse with intoxication, unspecified: Secondary | ICD-10-CM | POA: Diagnosis not present

## 2020-02-24 DIAGNOSIS — I10 Essential (primary) hypertension: Secondary | ICD-10-CM | POA: Insufficient documentation

## 2020-02-24 DIAGNOSIS — F1010 Alcohol abuse, uncomplicated: Secondary | ICD-10-CM

## 2020-02-24 DIAGNOSIS — F17210 Nicotine dependence, cigarettes, uncomplicated: Secondary | ICD-10-CM | POA: Diagnosis not present

## 2020-02-24 DIAGNOSIS — Y908 Blood alcohol level of 240 mg/100 ml or more: Secondary | ICD-10-CM | POA: Diagnosis not present

## 2020-02-24 DIAGNOSIS — R569 Unspecified convulsions: Secondary | ICD-10-CM | POA: Insufficient documentation

## 2020-02-24 HISTORY — DX: Bipolar disorder, unspecified: F31.9

## 2020-02-24 LAB — BASIC METABOLIC PANEL
Anion gap: 11 (ref 5–15)
BUN: 9 mg/dL (ref 6–20)
CO2: 20 mmol/L — ABNORMAL LOW (ref 22–32)
Calcium: 8.9 mg/dL (ref 8.9–10.3)
Chloride: 108 mmol/L (ref 98–111)
Creatinine, Ser: 0.58 mg/dL (ref 0.44–1.00)
GFR calc Af Amer: >60 mL/min (ref >60)
GFR calc non Af Amer: >60 mL/min (ref >60)
Glucose, Bld: 84 mg/dL (ref 70–99)
Potassium: 3.7 mmol/L (ref 3.5–5.1)
Sodium: 139 mmol/L (ref 135–145)

## 2020-02-24 LAB — HEPATIC FUNCTION PANEL
ALT: 13 U/L (ref 0–44)
AST: 21 U/L (ref 15–41)
Albumin: 4.3 g/dL (ref 3.5–5.0)
Alkaline Phosphatase: 57 U/L (ref 38–126)
Bilirubin, Direct: 0.1 mg/dL (ref 0.0–0.2)
Indirect Bilirubin: 0.6 mg/dL (ref 0.3–0.9)
Total Bilirubin: 0.7 mg/dL (ref 0.3–1.2)
Total Protein: 7.4 g/dL (ref 6.5–8.1)

## 2020-02-24 LAB — CBC
HCT: 40.4 % (ref 36.0–46.0)
Hemoglobin: 13.3 g/dL (ref 12.0–15.0)
MCH: 29.2 pg (ref 26.0–34.0)
MCHC: 32.9 g/dL (ref 30.0–36.0)
MCV: 88.8 fL (ref 80.0–100.0)
Platelets: 191 10*3/uL (ref 150–400)
RBC: 4.55 MIL/uL (ref 3.87–5.11)
RDW: 13.2 % (ref 11.5–15.5)
WBC: 7.1 10*3/uL (ref 4.0–10.5)
nRBC: 0 % (ref 0.0–0.2)

## 2020-02-24 LAB — ETHANOL: Alcohol, Ethyl (B): 263 mg/dL — ABNORMAL HIGH (ref <10)

## 2020-02-24 LAB — HCG, QUANTITATIVE, PREGNANCY: hCG, Beta Chain, Quant, S: 1 m[IU]/mL (ref <5)

## 2020-02-24 NOTE — ED Provider Notes (Signed)
[facility] EMERGENCY DEPARTMENT Provider Note   CSN: [id number] Arrival date & time: 02/24/20  1422     History Chief Complaint  Patient presents with  . Seizures    Brandy Vega is a 44 y.o. female.  Patient had what seemed to be a seizure today.  She picked up by the paramedics and was postictal.  When she got into the emergency department she was a little bit confused but alert.  She did not know what happened.  The history is provided by the patient. No language interpreter was used.  Seizures Seizure activity on arrival: yes   Seizure type:  Grand mal Preceding symptoms: no sensation of an aura present   Initial focality:  None Episode characteristics: no abnormal movements   Postictal symptoms: confusion   Return to baseline: no   Severity:  Moderate Timing:  Once Progression:  Resolved Context: alcohol withdrawal        Past Medical History:  Diagnosis Date  . Bipolar 1 disorder (HCC)   . Essential hypertension 09/05/2018  . GAD (generalized anxiety disorder) 06/25/2019  . MDD (major depressive disorder), recurrent episode, severe (HCC) 03/18/2018    Patient Active Problem List   Diagnosis Date Noted  . GAD (generalized anxiety disorder) 06/25/2019  . Essential hypertension 09/05/2018  . DUB (dysfunctional uterine bleeding) 05/16/2018  . History of alcohol abuse 03/19/2018  . MDD (major depressive disorder), recurrent episode, severe (HCC) 03/18/2018    Past Surgical History:  Procedure Laterality Date  . DILATION AND CURETTAGE OF UTERUS       OB History   No obstetric history on file.     Family History  Problem Relation Age of Onset  . Hypertension Mother   . Diabetes Father     Social History   Tobacco Use  . Smoking status: Current Every Day Smoker    Packs/day: 0.50    Types: Cigarettes  . Smokeless tobacco: Never Used  Vaping Use  . Vaping Use: Never used  Substance Use Topics  . Alcohol use: Yes    Comment: occ. use  . Drug  use: No    Home Medications Prior to Admission medications   Medication Sig Start Date End Date Taking? Authorizing Provider  baclofen (LIORESAL) 10 MG tablet Take 1 tablet (10 mg total) by mouth 3 (three) times daily. 12/25/19   [name] A, FNP  ibuprofen (ADVIL) 800 MG tablet Take 1 tablet (800 mg total) by mouth every 8 (eight) hours as needed. 09/08/19   Remus Loffler, PA-C  Melatonin 10 MG TABS Take by mouth.    [provider]  propranolol (INDERAL) 10 MG tablet Take 1 tablet (10 mg total) by mouth 3 (three) times daily as needed. for anxiety 12/25/19   [name] A, FNP  spironolactone (ALDACTONE) 25 MG tablet Take 1 tablet (25 mg total) by mouth daily. 12/25/19   Junie Spencer, FNP  traZODone (DESYREL) 50 MG tablet Take 1 tablet (50 mg total) by mouth at bedtime as needed for sleep. 12/25/19   Junie Spencer, FNP    Allergies    Patient has no known allergies.  Review of Systems   Review of Systems  Constitutional: Negative for appetite change and fatigue.  HENT: Negative for congestion, ear discharge and sinus pressure.   Eyes: Negative for discharge.  Respiratory: Negative for cough.   Cardiovascular: Negative for chest pain.  Gastrointestinal: Negative for abdominal pain and diarrhea.  Genitourinary: Negative for frequency and hematuria.  Musculoskeletal: Negative for back pain.  Skin: Negative for rash.  Neurological: Positive for seizures. Negative for headaches.  Psychiatric/Behavioral: Negative for hallucinations.    Physical Exam Updated Vital Signs BP 106/74   Pulse 70   Temp 98 F (36.7 C) (Oral)   Resp 15   Ht 5\' 5"  (1.651 m)   Wt 52.6 kg   SpO2 98%   BMI 19.30 kg/m   Physical Exam Vitals and nursing note reviewed.  Constitutional:      Appearance: She is well-developed.  HENT:     Head: Normocephalic.     Nose: Nose normal.  Eyes:     General: No scleral icterus.    Conjunctiva/sclera: Conjunctivae normal.  Neck:      Thyroid: No thyromegaly.  Cardiovascular:     Rate and Rhythm: Normal rate and regular rhythm.     Heart sounds: No murmur heard.  No friction rub. No gallop.   Pulmonary:     Breath sounds: No stridor. No wheezing or rales.  Chest:     Chest wall: No tenderness.  Abdominal:     General: There is no distension.     Tenderness: There is no abdominal tenderness. There is no rebound.  Musculoskeletal:        General: Normal range of motion.     Cervical back: Neck supple.  Lymphadenopathy:     Cervical: No cervical adenopathy.  Skin:    Findings: No erythema or rash.  Neurological:     Mental Status: She is alert and oriented to person, place, and time.     Motor: No abnormal muscle tone.     Coordination: Coordination normal.  Psychiatric:        Behavior: Behavior normal.     ED Results / Procedures / Treatments   Labs (all labs ordered are listed, but only abnormal results are displayed) Labs Reviewed  BASIC METABOLIC PANEL - Abnormal; Notable for the following components:      Result Value   CO2 20 (*)    All other components within normal limits  ETHANOL - Abnormal; Notable for the following components:   Alcohol, Ethyl (B) 263 (*)    All other components within normal limits  CBC  HEPATIC FUNCTION PANEL  HCG, QUANTITATIVE, PREGNANCY    EKG None  Radiology CT Head Wo Contrast  Result Date: 02/24/2020 CLINICAL DATA:  Seizure with slurred speech and ataxia EXAM: CT HEAD WITHOUT CONTRAST TECHNIQUE: Contiguous axial images were obtained from the base of the skull through the vertex without intravenous contrast. COMPARISON:  None. FINDINGS: Brain: Ventricles and sulci are normal in size and configuration. There is no intracranial mass, hemorrhage, extra-axial fluid collection, or midline shift. Brain parenchyma appears unremarkable. No acute infarct evident. Vascular: No hyperdense vessel.  No evident vascular calcification. Skull: The bony calvarium appears intact.  Sinuses/Orbits: There is mucosal thickening in several ethmoid air cells. Other visualized paranasal sinuses are clear. Visualized orbits appear symmetric bilaterally. Other: Mastoid air cells are clear. IMPRESSION: Mucosal thickening in several ethmoid air cells. Study otherwise unremarkable. Electronically Signed   By: 02/26/2020 III M.D.   On: 02/24/2020 15:42    Procedures Procedures (including critical care time)  Medications Ordered in ED Medications - No data to display  ED Course  I have reviewed the triage vital signs and the nursing notes.  Pertinent labs & imaging results that were available during my care of the patient were reviewed by me and considered in my medical  decision making (see chart for details).    MDM Rules/Calculators/A&P                          Patient with most likely alcohol related seizures.  She is going to see her family doctor tomorrow and is given a copy of the labs and x-rays that were done here.  She was also referred to Dr. Gerilyn Pilgrim and told to try to stop drinking         This patient presents to the ED for concern of seizures, this involves an extensive number of treatment options, and is a complaint that carries with it a high risk of complications and morbidity.  The differential diagnosis includes alcohol and seizures   Lab Tests:   I Ordered, reviewed, and interpreted labs, which included EtOH with elevated CBC and chemistries unremarkable  Medicines ordered:   I ordered medication IV fluids for dehydration  Imaging Studies ordered:   I ordered imaging studies which included CT head and  I independently visualized and interpreted imaging which showed unremarkable  Additional history obtained:   Additional history obtained from EMS  Previous records obtained and reviewed.  Consultations Obtained:     Reevaluation:  After the interventions stated above, I reevaluated the patient and found  improved  Critical Interventions:  .         Final Clinical Impression(s) / ED Diagnoses Final diagnoses:  Seizure (HCC)  Alcohol abuse    Rx / DC Orders ED Discharge Orders    None       Bethann Berkshire, MD 02/24/20 1627

## 2020-02-24 NOTE — Discharge Instructions (Addendum)
Follow-up with your family doctor tomorrow as planned.  Try to stop drinking alcohol.  You have also been referred to Dr. Gerilyn Pilgrim the neurologist for seizures.  Do not do any driving until your physician or Dr. Gerilyn Pilgrim states that it is okay to drive

## 2020-02-24 NOTE — ED Triage Notes (Signed)
Pt brought in by [facility] for c/o seizures; pt was post-ictal when ems arrived; pt has a doctor tomorrow for her seizures; pt had slurred speech and sluggish pupils; pt doesn't remember what happened

## 2020-02-24 NOTE — ED Notes (Signed)
Seizure pads in place

## 2020-02-25 ENCOUNTER — Telehealth: Payer: Self-pay | Admitting: Family

## 2020-02-25 ENCOUNTER — Ambulatory Visit (INDEPENDENT_AMBULATORY_CARE_PROVIDER_SITE_OTHER): Payer: 59 | Admitting: Family

## 2020-02-25 ENCOUNTER — Other Ambulatory Visit: Payer: Self-pay

## 2020-02-25 ENCOUNTER — Encounter: Payer: Self-pay | Admitting: Family

## 2020-02-25 VITALS — BP 126/89 | HR 104 | Temp 97.5°F | Ht 65.0 in | Wt 118.0 lb

## 2020-02-25 DIAGNOSIS — F1010 Alcohol abuse, uncomplicated: Secondary | ICD-10-CM | POA: Insufficient documentation

## 2020-02-25 DIAGNOSIS — F1020 Alcohol dependence, uncomplicated: Secondary | ICD-10-CM | POA: Insufficient documentation

## 2020-02-25 DIAGNOSIS — R55 Syncope and collapse: Secondary | ICD-10-CM

## 2020-02-25 DIAGNOSIS — I10 Essential (primary) hypertension: Secondary | ICD-10-CM

## 2020-02-25 DIAGNOSIS — G40909 Epilepsy, unspecified, not intractable, without status epilepticus: Secondary | ICD-10-CM

## 2020-02-25 DIAGNOSIS — F411 Generalized anxiety disorder: Secondary | ICD-10-CM

## 2020-02-25 DIAGNOSIS — G4700 Insomnia, unspecified: Secondary | ICD-10-CM

## 2020-02-25 DIAGNOSIS — F332 Major depressive disorder, recurrent severe without psychotic features: Secondary | ICD-10-CM

## 2020-02-25 DIAGNOSIS — F1011 Alcohol abuse, in remission: Secondary | ICD-10-CM | POA: Insufficient documentation

## 2020-02-25 DIAGNOSIS — Z09 Encounter for follow-up examination after completed treatment for conditions other than malignant neoplasm: Secondary | ICD-10-CM

## 2020-02-25 NOTE — Telephone Encounter (Signed)
Pt called to let Neysa Bonito know that she talked to her boss at work about having to be out of work and also about being referred to see a neurologist. Pt says her boss wants [place] to fax letter stating that pt is being referred to neurologist. 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 Can fax to [facility] @ [phone].

## 2020-02-25 NOTE — Progress Notes (Signed)
Subjective:    Patient ID: Brandy Vega, female    DOB: 1975-12-01, 44 y.o.   MRN: [id number]  Chief Complaint  Patient presents with  . Medical Management of Chronic Issues    Patient has been having black outs went [facility] yesterday   Pt presents to the office today for chronic follow up. She went to the ED yesterday and was diagnosed with seizures that thought was related to alcohol. She reports she drinks 6-12 beers when she is off.  Hypertension This is a chronic problem. The current episode started more than 1 year ago. The problem has been resolved since onset. The problem is controlled. Pertinent negatives include no anxiety, malaise/fatigue, peripheral edema or shortness of breath. Risk factors for coronary artery disease include dyslipidemia, obesity and sedentary lifestyle. The current treatment provides moderate improvement.  Anxiety Presents for follow-up visit. Symptoms include depressed mood, excessive worry, insomnia, irritability and nervous/anxious behavior. Patient reports no shortness of breath. The quality of sleep is good.    Insomnia Primary symptoms: difficulty falling asleep, frequent awakening, no malaise/fatigue.  The current episode started more than one year. The onset quality is gradual.      Review of Systems  Constitutional: Positive for irritability. Negative for malaise/fatigue.  Respiratory: Negative for shortness of breath.   Psychiatric/Behavioral: The patient is nervous/anxious and has insomnia.   All other systems reviewed and are negative.      Objective:   Physical Exam Vitals reviewed.  Constitutional:      General: She is not in acute distress.    Appearance: She is well-developed.  HENT:     Head: Normocephalic and atraumatic.     Right Ear: Tympanic membrane normal.     Left Ear: Tympanic membrane normal.  Eyes:     Pupils: Pupils are equal, round, and reactive to light.  Neck:     Thyroid: No thyromegaly.    Cardiovascular:     Rate and Rhythm: Normal rate and regular rhythm.     Heart sounds: Normal heart sounds. No murmur heard.   Pulmonary:     Effort: Pulmonary effort is normal. No respiratory distress.     Breath sounds: Normal breath sounds. No wheezing.  Abdominal:     General: Bowel sounds are normal. There is no distension.     Palpations: Abdomen is soft.     Tenderness: There is no abdominal tenderness.  Musculoskeletal:        General: No tenderness. Normal range of motion.     Cervical back: Normal range of motion and neck supple.  Skin:    General: Skin is warm and dry.  Neurological:     Mental Status: She is alert and oriented to person, place, and time.     Cranial Nerves: No cranial nerve deficit.     Deep Tendon Reflexes: Reflexes are normal and symmetric.  Psychiatric:        Mood and Affect: Mood is anxious.        Behavior: Behavior normal.        Thought Content: Thought content normal.        Judgment: Judgment normal.       BP 126/89   Pulse (!) 104   Temp (!) 97.5 F (36.4 C) (Temporal)   Ht 5\' 5"  (1.651 m)   Wt 118 lb (53.5 kg)   BMI 19.64 kg/m      Assessment & Plan:  Brandy Vega comes in today with chief complaint of  Medical Management of Chronic Issues (Patient has been having black outs went [facility] yesterday)   Diagnosis and orders addressed:  1. Essential hypertension  2. GAD (generalized anxiety disorder)   3. Severe episode of recurrent major depressive disorder, without psychotic features (HCC)  4. Insomnia, unspecified type  5. Alcohol use disorder, severe, dependence (HCC) - Ambulatory referral to Neurology -Discussed the importance of decreasing alcohol  6. Syncope, unspecified syncope type - Ambulatory referral to Neurology  7. Seizure disorder [facility]) - Ambulatory referral to Neurology  8. Hospital discharge follow-up -Notes reviewed   Labs and EKG reviewed from ED yesterday  Health Maintenance  reviewed Diet and exercise encouraged  Follow up plan: 2-3 months for pap   [name], FNP

## 2020-02-25 NOTE — Telephone Encounter (Signed)
Note written can we please fax this.

## 2020-02-25 NOTE — Telephone Encounter (Signed)
Letter was faxed per patient request, patient aware.

## 2020-02-25 NOTE — Patient Instructions (Signed)
Syncope  Syncope refers to a condition in which a person temporarily loses consciousness. Syncope may also be called fainting or passing out. It is caused by a sudden decrease in blood flow to the brain. Even though most causes of syncope are not dangerous, syncope can be a sign of a serious medical problem. Your health care provider may do tests to find the reason why you are having syncope. Signs that you may be about to faint include:  Feeling dizzy or light-headed.  Feeling nauseous.  Seeing all white or all black in your field of vision.  Having cold, clammy skin. If you faint, get medical help right away. Call your local emergency services (911 in the [place] Do not drive yourself to the hospital. Follow these instructions at home: Pay attention to any changes in your symptoms. Take these actions to stay safe and to help relieve your symptoms: Lifestyle  Do not drive, use machinery, or play sports until your health care provider says it is okay.  Do not drink alcohol.  Do not use any products that contain nicotine or tobacco, such as cigarettes and e-cigarettes. If you need help quitting, ask your health care provider.  Drink enough fluid to keep your urine pale yellow. General instructions  Take over-the-counter and prescription medicines only as told by your health care provider.  If you are taking blood pressure or heart medicine, get up slowly and take several minutes to sit and then stand. This can reduce dizziness or light-headedness.  Have someone stay with you until you feel stable.  If you start to feel like you might faint, lie down right away and raise (elevate) your feet above the level of your heart. Breathe deeply and steadily. Wait until all the symptoms have passed.  Keep all follow-up visits as told by your health care provider. This is important. Get help right away if you:  Have a severe headache.  Faint once or repeatedly.  Have pain in your chest,  abdomen, or back.  Have a very fast or irregular heartbeat (palpitations).  Have pain when you breathe.  Are bleeding from your mouth or rectum, or you have black or tarry stool.  Have a seizure.  Are confused.  Have trouble walking.  Have severe weakness.  Have vision problems. These symptoms may represent a serious problem that is an emergency. Do not wait to see if your symptoms will go away. Get medical help right away. Call your local emergency services (911 in the [place] Do not drive yourself to the hospital. Summary  Syncope refers to a condition in which a person temporarily loses consciousness. It is caused by a sudden decrease in blood flow to the brain.  Signs that you may be about to faint include dizziness, feeling light-headed, feeling nauseous, sudden vision changes, or cold, clammy skin.  Although most causes of syncope are not dangerous, syncope can be a sign of a serious medical problem. If you faint, get medical help right away. This information is not intended to replace advice given to you by your health care provider. Make sure you discuss any questions you have with your health care provider. Document Revised: 07/27/2017 Document Reviewed: 07/23/2017 Elsevier Patient Education  2020 Elsevier Inc.  

## 2020-03-16 ENCOUNTER — Other Ambulatory Visit: Payer: Self-pay | Admitting: Family

## 2020-03-17 DIAGNOSIS — Z0289 Encounter for other administrative examinations: Secondary | ICD-10-CM

## 2020-03-31 DIAGNOSIS — F32A Depression, unspecified: Secondary | ICD-10-CM | POA: Insufficient documentation

## 2020-03-31 DIAGNOSIS — F5104 Psychophysiologic insomnia: Secondary | ICD-10-CM | POA: Insufficient documentation

## 2020-06-01 ENCOUNTER — Other Ambulatory Visit: Payer: Self-pay

## 2020-06-01 DIAGNOSIS — M542 Cervicalgia: Secondary | ICD-10-CM

## 2020-06-01 MED ORDER — IBUPROFEN 800 MG PO TABS: 800.0000 mg | ORAL_TABLET | Freq: Three times a day (TID) | ORAL | 0 refills | Status: DC | PRN

## 2020-06-01 NOTE — Telephone Encounter (Signed)
  Prescription Request  06/01/2020  What is the name of the medication or equipment? ibuprofen (ADVIL) 800 MG tablet  Have you contacted your pharmacy to request a refill? (if applicable) no  Which pharmacy would you like this sent to? [facility] [place]   Patient notified that their request is being sent to the clinical staff for review and that they should receive a response within 2 business days.

## 2020-07-02 ENCOUNTER — Encounter: Payer: 59 | Admitting: Family

## 2020-07-12 ENCOUNTER — Telehealth: Payer: Self-pay

## 2020-07-12 NOTE — Telephone Encounter (Signed)
FMLA for intermittent leave for this was done 03/27/19 and it ran out Please advise on giving patient note for the dates 07/05/20 & 07/06/20

## 2020-07-13 NOTE — Telephone Encounter (Signed)
Pt aware of provider feedback and voiced understanding. 

## 2020-07-13 NOTE — Telephone Encounter (Signed)
Ok for note 

## 2020-07-13 NOTE — Telephone Encounter (Signed)
I am sorry, but can not write you a note. She had not office visit between this time. Last visit was in June 2021.

## 2020-07-13 NOTE — Telephone Encounter (Signed)
Please see previous message, I can not write note.

## 2020-08-02 ENCOUNTER — Encounter: Payer: 59 | Admitting: Family

## 2020-08-13 ENCOUNTER — Encounter: Payer: 59 | Admitting: Family

## 2020-09-07 ENCOUNTER — Encounter: Payer: Self-pay | Admitting: Nurse Practitioner

## 2020-09-07 ENCOUNTER — Ambulatory Visit (INDEPENDENT_AMBULATORY_CARE_PROVIDER_SITE_OTHER): Payer: 59 | Admitting: Nurse Practitioner

## 2020-09-07 DIAGNOSIS — R0602 Shortness of breath: Secondary | ICD-10-CM | POA: Diagnosis not present

## 2020-09-07 MED ORDER — ALBUTEROL SULFATE HFA 108 (90 BASE) MCG/ACT IN AERS: 2.0000 | INHALATION_SPRAY | Freq: Four times a day (QID) | RESPIRATORY_TRACT | 0 refills | Status: DC | PRN

## 2020-09-07 MED ORDER — PREDNISONE 20 MG PO TABS: ORAL_TABLET | ORAL | 0 refills | Status: DC

## 2020-09-07 NOTE — Progress Notes (Signed)
   Virtual Visit via telephone Note Due to COVID-19 pandemic this visit was conducted virtually. This visit type was conducted due to national recommendations for restrictions regarding the COVID-19 Pandemic (e.g. social distancing, sheltering in place) in an effort to limit this patient's exposure and mitigate transmission in our community. All issues noted in this document were discussed and addressed.  A physical exam was not performed with this format.  I connected with Brandy Vega on 09/07/20 at 9:40 by telephone and verified that I am speaking with the correct person using two identifiers. Brandy Vega is currently located at home and no one is currently with  her during visit. The provider, Mary-Margaret Daphine Deutscher, FNP is located in their office at time of visit.  I discussed the limitations, risks, security and privacy concerns of performing an evaluation and management service by telephone and the availability of in person appointments. I also discussed with the patient that there may be a patient responsible charge related to this service. The patient expressed understanding and agreed to proceed.   History and Present Illness:   Chief Complaint: Shortness of Breath   HPI Patient was dx with covid on December 14. She says since then she is still sob with night sweats. She also has slight confusion at times. She was given antibiotics and nasal spry for covid. She says she has to rest more because she is getting SOB. She has also been experiencing low back pain. She has gin eback to work. She is a smoker  Review of Systems  Constitutional: Positive for diaphoresis and malaise/fatigue. Negative for chills and fever.  HENT: Positive for congestion. Negative for sinus pain and sore throat.   Respiratory: Positive for cough (occasional) and shortness of breath. Negative for sputum production.   Musculoskeletal: Negative.   Neurological: Positive for weakness. Negative for dizziness and  headaches.  Psychiatric/Behavioral: Negative.      Observations/Objective: Alert and oriented- answers all questions appropriately No distress    Assessment and Plan: Brandy Vega in today with chief complaint of Shortness of Breath   1. SOB (shortness of breath) Force fluids  Rest Keep follow up appointment in 1 week. - predniSONE (DELTASONE) 20 MG tablet; 2 po at sametime daily for 5 days  Dispense: 10 tablet; Refill: 0 - albuterol (VENTOLIN HFA) 108 (90 Base) MCG/ACT inhaler; Inhale 2 puffs into the lungs every 6 (six) hours as needed for wheezing or shortness of breath.  Dispense: 8 g; Refill: 0        Follow Up Instructions: *keep follow up appointment in 1 week    I discussed the assessment and treatment plan with the patient. The patient was provided an opportunity to ask questions and all were answered. The patient agreed with the plan and demonstrated an understanding of the instructions.   The patient was advised to call back or seek an in-person evaluation if the symptoms worsen or if the condition fails to improve as anticipated.  The above assessment and management plan was discussed with the patient. The patient verbalized understanding of and has agreed to the management plan. Patient is aware to call the clinic if symptoms persist or worsen. Patient is aware when to return to the clinic for a follow-up visit. Patient educated on when it is appropriate to go to the emergency department.   Time call ended:  9:55  I provided 15 minutes of non-face-to-face time during this encounter.    Mary-Margaret Daphine Deutscher, FNP

## 2020-09-17 ENCOUNTER — Encounter: Payer: Self-pay | Admitting: Family

## 2020-09-17 ENCOUNTER — Other Ambulatory Visit: Payer: Self-pay

## 2020-09-17 ENCOUNTER — Ambulatory Visit (INDEPENDENT_AMBULATORY_CARE_PROVIDER_SITE_OTHER): Payer: 59 | Admitting: Family

## 2020-09-17 DIAGNOSIS — F17200 Nicotine dependence, unspecified, uncomplicated: Secondary | ICD-10-CM | POA: Diagnosis not present

## 2020-09-17 DIAGNOSIS — B9689 Other specified bacterial agents as the cause of diseases classified elsewhere: Secondary | ICD-10-CM

## 2020-09-17 DIAGNOSIS — J208 Acute bronchitis due to other specified organisms: Secondary | ICD-10-CM

## 2020-09-17 MED ORDER — DOXYCYCLINE HYCLATE 100 MG PO TABS: 100.0000 mg | ORAL_TABLET | Freq: Two times a day (BID) | ORAL | 0 refills | Status: DC

## 2020-09-17 NOTE — Progress Notes (Signed)
Virtual Visit via telephone Note Due to COVID-19 pandemic this visit was conducted virtually. This visit type was conducted due to national recommendations for restrictions regarding the COVID-19 Pandemic (e.g. social distancing, sheltering in place) in an effort to limit this patient's exposure and mitigate transmission in our community. All issues noted in this document were discussed and addressed.  A physical exam was not performed with this format.  I connected with Brandy Vega on 09/17/20 at 2:42 pm  by telephone and verified that I am speaking with the correct person using two identifiers. Brandy Vega is currently located at home and  boyfriend is currently with her during visit. The provider, [name], FNP is located in their office at time of visit.  I discussed the limitations, risks, security and privacy concerns of performing an evaluation and management service by telephone and the availability of in person appointments. I also discussed with the patient that there may be a patient responsible charge related to this service. The patient expressed understanding and agreed to proceed.   History and Present Illness:  Cough This is a new problem. The current episode started 1 to 4 weeks ago. The problem has been waxing and waning. The problem occurs every few minutes. The cough is productive of sputum. Associated symptoms include chills, ear congestion, headaches, myalgias, nasal congestion, rhinorrhea, a sore throat, shortness of breath and wheezing. Pertinent negatives include no ear pain or fever. Associated symptoms comments: Diarrhea. Risk factors for lung disease include smoking/tobacco exposure. She has tried rest and oral steroids for the symptoms. The treatment provided mild relief.      Review of Systems  Constitutional: Positive for chills. Negative for fever.  HENT: Positive for rhinorrhea and sore throat. Negative for ear pain.   Respiratory: Positive for cough,  shortness of breath and wheezing.   Musculoskeletal: Positive for myalgias.  Neurological: Positive for headaches.  All other systems reviewed and are negative.    Observations/Objective: No SOB or distress noted   Assessment and Plan: 1. Acute bacterial bronchitis - Take meds as prescribed - Use a cool mist humidifier  -Use saline nose sprays frequently -Force fluids -For any cough or congestion  Use plain Mucinex- regular strength or max strength is fine -For fever or aces or pains- take tylenol or ibuprofen. -Throat lozenges if help -RTO if symptoms worsen or do not improve. Red flags discussed, to go to ED.  - doxycycline (VIBRA-TABS) 100 MG tablet; Take 1 tablet (100 mg total) by mouth 2 (two) times daily.  Dispense: 20 tablet; Refill: 0  2. Current smoker Smoking cessation discussed      I discussed the assessment and treatment plan with the patient. The patient was provided an opportunity to ask questions and all were answered. The patient agreed with the plan and demonstrated an understanding of the instructions.   The patient was advised to call back or seek an in-person evaluation if the symptoms worsen or if the condition fails to improve as anticipated.  The above assessment and management plan was discussed with the patient. The patient verbalized understanding of and has agreed to the management plan. Patient is aware to call the clinic if symptoms persist or worsen. Patient is aware when to return to the clinic for a follow-up visit. Patient educated on when it is appropriate to go to the emergency department.   Time call ended:  2:55 pm   I provided 13 minutes of non-face-to-face time during this encounter.  [name], FNP

## 2020-09-20 ENCOUNTER — Ambulatory Visit (INDEPENDENT_AMBULATORY_CARE_PROVIDER_SITE_OTHER): Payer: 59 | Admitting: Family

## 2020-09-20 ENCOUNTER — Encounter: Payer: Self-pay | Admitting: Family

## 2020-09-20 ENCOUNTER — Other Ambulatory Visit: Payer: 59

## 2020-09-20 VITALS — BP 125/81 | HR 104 | Temp 98.0°F | Ht 65.0 in | Wt 126.0 lb

## 2020-09-20 DIAGNOSIS — R002 Palpitations: Secondary | ICD-10-CM

## 2020-09-20 DIAGNOSIS — R569 Unspecified convulsions: Secondary | ICD-10-CM | POA: Diagnosis not present

## 2020-09-20 DIAGNOSIS — F17200 Nicotine dependence, unspecified, uncomplicated: Secondary | ICD-10-CM | POA: Diagnosis not present

## 2020-09-20 DIAGNOSIS — F1020 Alcohol dependence, uncomplicated: Secondary | ICD-10-CM | POA: Diagnosis not present

## 2020-09-20 DIAGNOSIS — R000 Tachycardia, unspecified: Secondary | ICD-10-CM

## 2020-09-20 NOTE — Progress Notes (Signed)
Subjective:    Patient ID: Brandy Vega, female    DOB: 1976/03/05, 45 y.o.   MRN: [id number]  Chief Complaint  Patient presents with  . Loss of Consciousness    Work form on [employer]. This happened last week    Pt presents to the office today for work note. She states on 09/14/20 she was at work and started feeling "weird" and went to a co-worker telling her she was having SOB and a panic attack. She states her friend sat her down and does not remember what happened next. However, she was told she was staring off at the wall and her hands started shaking. They called EMS and she was told her EKG showed her potassium was high.   Afterwards, she reports she felt fine and was ready to go back to work, but her manager did not let her until she was seen.   She denies any hx of seizure prior.  She reports she drinks 2-3 12 oz beers on her days off.   She saw a Neurologists in 05/2020 and was cleared.  Loss of Consciousness This is a new problem. The current episode started in the past 7 days. The problem occurs intermittently. The problem has been resolved. She lost consciousness for a period of less than 1 minute. Associated symptoms include malaise/fatigue and palpitations. Pertinent negatives include no nausea, slurred speech or visual change.  Nicotine Dependence Presents for follow-up visit. Her urge triggers include company of smokers. The symptoms have been stable. She smokes < 1/2 a pack of cigarettes per day.  Palpitations  This is a new problem. The current episode started 1 to 4 weeks ago. The problem occurs intermittently. The problem has been waxing and waning. Associated symptoms include malaise/fatigue and syncope. Pertinent negatives include no nausea.      Review of Systems  Constitutional: Positive for malaise/fatigue.  Cardiovascular: Positive for palpitations and syncope.  Gastrointestinal: Negative for nausea.  All other systems reviewed and are  negative.      Objective:   Physical Exam Vitals reviewed.  Constitutional:      General: She is not in acute distress.    Appearance: She is well-developed and well-nourished.  HENT:     Head: Normocephalic and atraumatic.     Right Ear: Tympanic membrane normal.     Left Ear: Tympanic membrane normal.     Mouth/Throat:     Mouth: Oropharynx is clear and moist.  Eyes:     Pupils: Pupils are equal, round, and reactive to light.  Neck:     Thyroid: No thyromegaly.  Cardiovascular:     Rate and Rhythm: Regular rhythm. Tachycardia present.     Pulses: Intact distal pulses.     Heart sounds: Normal heart sounds. No murmur heard.   Pulmonary:     Effort: Pulmonary effort is normal. No respiratory distress.     Breath sounds: Normal breath sounds. No wheezing.  Abdominal:     General: Bowel sounds are normal. There is no distension.     Palpations: Abdomen is soft.     Tenderness: There is no abdominal tenderness.  Musculoskeletal:        General: No tenderness or edema. Normal range of motion.     Cervical back: Normal range of motion and neck supple.  Skin:    General: Skin is warm and dry.  Neurological:     Mental Status: She is alert and oriented to person, place, and time.  Cranial Nerves: No cranial nerve deficit.     Deep Tendon Reflexes: Reflexes are normal and symmetric.  Psychiatric:        Mood and Affect: Mood and affect normal.        Behavior: Behavior normal.        Thought Content: Thought content normal.        Judgment: Judgment normal.       BP 125/81   Pulse (!) 104   Temp 98 F (36.7 C) (Temporal)   Ht 5' 5"  (1.651 m)   Wt 126 lb (57.2 kg)   SpO2 100%   BMI 20.97 kg/m      Assessment & Plan:  Traniya Prichett comes in today with chief complaint of Loss of Consciousness (Work form on [employer]. This happened last week )   Diagnosis and orders addressed:  1. Alcohol use disorder, severe, dependence ([place]) - TSH - EKG 12-Lead -  CMP14+EGFR - CBC with Differential/Platelet - Ambulatory referral to Neurology - Ambulatory referral to Cardiology  2. Current smoker - TSH - EKG 12-Lead - CMP14+EGFR - CBC with Differential/Platelet - Ambulatory referral to Cardiology  3. Witnessed seizure-like activity ([facility]) - TSH - EKG 12-Lead - CMP14+EGFR - CBC with Differential/Platelet - Ambulatory referral to Neurology - Ambulatory referral to Cardiology  4. Tachycardia - TSH - EKG 12-Lead - CMP14+EGFR - CBC with Differential/Platelet - Ambulatory referral to Neurology - Ambulatory referral to Cardiology  5. Palpitations - Ambulatory referral to Cardiology   Labs pending Referral to Neurologists and Cardiologists Discussed decreasing alcohol, but not stopping cold [place].  Health Maintenance reviewed Diet and exercise encouraged  Follow up plan: 2 weeks   [name], FNP

## 2020-09-20 NOTE — Patient Instructions (Signed)
Seizure, Adult A seizure is a sudden burst of abnormal electrical and chemical activity in the brain. Seizures usually last from 30 seconds to 2 minutes. The abnormal activity temporarily interrupts normal brain function. Many types of seizures can affect adults. A seizure can cause many different symptoms depending on where in the brain it starts. What are the causes? Common causes of this condition include:  Fever or infection.  Brain injury, head trauma, bleeding in the brain, or a brain tumor.  Low levels of blood sugar or salt (sodium).  Kidney problems or liver problems.  Metabolic disorders or other conditions that are passed from parent to child (are inherited).  Reaction to a substance, such as a drug or a medicine, or suddenly stopping the use of a substance (withdrawal).  A stroke.  Developmental disorders such as autism spectrum disorder or cerebral palsy. In some cases, the cause of a seizure may not be known. Some people who have a seizure never have another one. A person who has repeated seizures over time without a clear cause has a condition called epilepsy. What increases the risk? You are more likely to develop this condition if:  You have a family history of epilepsy.  You have had a tonic-clonic seizure before. This type of seizure causes tightening (contraction) of the muscles of the whole body and loss of consciousness.  You have a history of head trauma, lack of oxygen at birth, or strokes. What are the signs or symptoms? There are many different types of seizures. The symptoms vary depending on the type of seizure you have. Symptoms occur during the seizure. They may also occur before a seizure (aura) and after a seizure (postictal). Symptoms may include the following: Symptoms during a seizure  Uncontrollable shaking (convulsions) with fast, jerky movements of muscles.  Stiffening of the body.  Breathing problems.  Confusion, staring, or  unresponsiveness.  Head nodding, eye blinking or fluttering, or rapid eye movements.  Drooling, grunting, or making clicking sounds with your mouth.  Loss of bladder control and bowel control. Symptoms before a seizure  Fear or anxiety.  Nausea.  Vertigo. This is a feeling like: ? You are moving when you are not. ? Your surroundings are moving when they are not.  Dj vu. This is a feeling of having seen or heard something before.  Odd tastes or smells.  Changes in vision, such as seeing flashing lights or spots. Symptoms after a seizure  Confusion.  Sleepiness.  Headache.  Sore muscles. How is this diagnosed? This condition may be diagnosed based on:  A description of your symptoms. Video of your seizures can be helpful.  Your medical history.  A physical exam. You may also have tests, including:  Blood tests.  CT scan.  MRI.  Electroencephalogram (EEG). This test measures electrical activity in the brain. An EEG can predict whether seizures will return.  A spinal tap, also called a lumbar puncture. This is the removal and testing of fluid that surrounds the brain and spinal cord. How is this treated? Most seizures will stop on their own in less than 5 minutes, and no treatment is needed. Seizures that last longer than 5 minutes will usually need treatment. Seizures may be treated with:  Medicines given through an IV.  Avoiding known triggers, such as medicines that you take for another condition.  Medicines to control seizures or prevent future seizures (antiepileptics), if epilepsy caused your seizures.  Medical devices to prevent and control seizures.  Surgery   to stop seizures or to reduce how often seizures happen, if you have epilepsy that does not respond to medicines.  A diet low in carbohydrates and high in fat (ketogenic diet). Follow these instructions at home: Medicines  Take over-the-counter and prescription medicines only as told by  your health care provider.  Avoid any substances that may prevent your medicine from working properly, such as alcohol. Activity  Follow instructions about activities, such as driving or swimming, that would be dangerous if you had another seizure. Wait until your health care provider says it is safe to do them.  If you live in the [place] check with your local department of motor vehicles [facility]) to find out about local driving laws. Each state has specific rules about when you can legally drive again.  Get enough rest. Lack of sleep can make seizures more likely to occur. Educating others  Teach friends and family what to do if you have a seizure. They should: ? Help you get down to the ground, to prevent a fall. ? Cushion your head and move items away from your body. ? Loosen any tight clothing around your neck. ? Turn you on your side. If you vomit, this helps keep your airway clear. ? Know whether or not you need emergency care. ? Stay with you until you recover.  Also, tell them what not to do if you have a seizure. Tell them: ? They should not hold you down. Holding you down will not stop the seizure. ? They should not put anything in your mouth.   General instructions  Avoid anything that has ever triggered a seizure for you.  Keep a seizure diary. Record what you remember about each seizure, especially anything that might have triggered it.  Keep all follow-up visits. This is important. Contact a health care provider if:  You have another seizure or seizures. Call each time you have a seizure.  Your seizure pattern changes.  You continue to have seizures with treatment.  You have symptoms of an infection or illness. Either of these might increase your risk of having a seizure.  You are unable to take your medicine. Get help right away if:  You have: ? A seizure that does not stop after 5 minutes. ? Several seizures in a row without a complete recovery between  seizures. ? A seizure that makes it harder to breathe. ? A seizure that leaves you unable to speak or use a part of your body.  You do not wake up right away after a seizure.  You injure yourself during a seizure.  You have confusion or pain right after a seizure. These symptoms may represent a serious problem that is an emergency. Do not wait to see if the symptoms will go away. Get medical help right away. Call your local emergency services (911 in the [place] Do not drive yourself to the hospital. Summary  Seizures are caused by abnormal electrical and chemical activity in the brain. The activity disrupts normal brain function and can cause various symptoms.  Seizures have many causes, including illness, head injuries, low levels of blood sugar or salt, and certain conditions.  Most seizures will stop on their own in less than 5 minutes. Seizures that last longer than 5 minutes are a medical emergency and need treatment right away.  Many medicines are used to treat seizures. Take over-the-counter and prescription medicines only as told by your health care provider. This information is not intended to replace advice  given to you by your health care provider. Make sure you discuss any questions you have with your health care provider. Document Revised: 02/20/2020 Document Reviewed: 02/20/2020 Elsevier Patient Education  2021 Elsevier Inc.  

## 2020-09-21 ENCOUNTER — Telehealth: Payer: Self-pay

## 2020-09-21 LAB — TSH: TSH: 2 u[IU]/mL (ref 0.450–4.500)

## 2020-09-21 LAB — CMP14+EGFR
ALT: 17 IU/L (ref 0–32)
AST: 39 IU/L (ref 0–40)
Albumin/Globulin Ratio: 1.8 (ref 1.2–2.2)
Albumin: 4.5 g/dL (ref 3.8–4.8)
Alkaline Phosphatase: 66 IU/L (ref 44–121)
BUN/Creatinine Ratio: 11 (ref 9–23)
BUN: 10 mg/dL (ref 6–24)
Bilirubin Total: 1.7 mg/dL — ABNORMAL HIGH (ref 0.0–1.2)
CO2: 20 mmol/L (ref 20–29)
Calcium: 9.5 mg/dL (ref 8.7–10.2)
Chloride: 101 mmol/L (ref 96–106)
Creatinine, Ser: 0.9 mg/dL (ref 0.57–1.00)
GFR calc Af Amer: 91 mL/min/{1.73_m2} (ref >59)
GFR calc non Af Amer: 79 mL/min/{1.73_m2} (ref >59)
Globulin, Total: 2.5 g/dL (ref 1.5–4.5)
Glucose: 112 mg/dL — ABNORMAL HIGH (ref 65–99)
Potassium: 3.4 mmol/L — ABNORMAL LOW (ref 3.5–5.2)
Sodium: 137 mmol/L (ref 134–144)
Total Protein: 7 g/dL (ref 6.0–8.5)

## 2020-09-21 LAB — CBC WITH DIFFERENTIAL/PLATELET
Basophils Absolute: 0 10*3/uL (ref 0.0–0.2)
Basos: 1 %
EOS (ABSOLUTE): 0.1 10*3/uL (ref 0.0–0.4)
Eos: 1 %
Hematocrit: 41.1 % (ref 34.0–46.6)
Hemoglobin: 14 g/dL (ref 11.1–15.9)
Immature Grans (Abs): 0 10*3/uL (ref 0.0–0.1)
Immature Granulocytes: 0 %
Lymphocytes Absolute: 1.5 10*3/uL (ref 0.7–3.1)
Lymphs: 18 %
MCH: 29.8 pg (ref 26.6–33.0)
MCHC: 34.1 g/dL (ref 31.5–35.7)
MCV: 87 fL (ref 79–97)
Monocytes Absolute: 0.5 10*3/uL (ref 0.1–0.9)
Monocytes: 7 %
Neutrophils Absolute: 6.1 10*3/uL (ref 1.4–7.0)
Neutrophils: 73 %
Platelets: 157 10*3/uL (ref 150–450)
RBC: 4.7 x10E6/uL (ref 3.77–5.28)
RDW: 13.1 % (ref 11.7–15.4)
WBC: 8.3 10*3/uL (ref 3.4–10.8)

## 2020-09-21 NOTE — Telephone Encounter (Signed)
See result note.  

## 2020-09-22 ENCOUNTER — Telehealth: Payer: Self-pay

## 2020-09-22 NOTE — Telephone Encounter (Signed)
Pt aware this form is not yet complete - aware that she may not return to work without form.   Aware that we will call there as well as fax the form asap.

## 2020-09-23 NOTE — Telephone Encounter (Signed)
Patient aware and verbalizes understanding. 

## 2020-09-23 NOTE — Telephone Encounter (Signed)
Potassium rich foods Dried fruits (raisins, apricots), Beans, Potatoes, Winter squash (acorn, butternut), Spinach, broccoli, beet greens, Avocado, and Bananas.

## 2020-09-27 NOTE — Telephone Encounter (Signed)
This has already been faxed and I spoke with HR lady at her work.

## 2020-10-22 ENCOUNTER — Other Ambulatory Visit: Payer: Self-pay | Admitting: Family

## 2020-11-16 DIAGNOSIS — R002 Palpitations: Secondary | ICD-10-CM | POA: Insufficient documentation

## 2020-11-16 DIAGNOSIS — R000 Tachycardia, unspecified: Secondary | ICD-10-CM | POA: Insufficient documentation

## 2020-11-16 NOTE — Progress Notes (Signed)
Cardiology Office Note   Date:  11/17/2020   ID:  Brandy Vega, DOB 09-13-1975, MRN [id number]  PCP:  Junie Spencer, FNP  Cardiologist:   No primary care provider on file. Referring:  Junie Spencer, FNP  Chief Complaint  Patient presents with  . Palpitations      History of Present Illness: Brandy Vega is a 45 y.o. female who is referred by Junie Spencer, FNP for evaluation of palpitations and tachycardia.  She has what she reports as panic attacks.  She is labeled as having an anxiety disorder has not really treated that with other than as needed beta-blocker.  At work she has had to have EMS come most recently in January.  She does not even really recall the events.  She gets short of breath.  They have done EKGs she says that have been okay.  She does not really find a trigger.  Sometimes she can get panicky even at night and she will feel her heart beating fast.  She saw neurology and was told to take her propranolol 3 times a day.  She said since she started that she has felt better.  She is describing rapid and strong beats.  She is not describing substernal chest discomfort, neck or arm discomfort.  She does not have any real shortness of breath when she is panic.  She tries to be active in her job is active and she walks 5000 steps a day.  She does not really bring on the symptoms with this.  She is never had any prior cardiac testing.   Past Medical History:  Diagnosis Date  . Bipolar 1 disorder (HCC)   . Essential hypertension 09/05/2018  . GAD (generalized anxiety disorder) 06/25/2019  . MDD (major depressive disorder), recurrent episode, severe (HCC) 03/18/2018    Past Surgical History:  Procedure Laterality Date  . DILATION AND CURETTAGE OF UTERUS       Current Outpatient Medications  Medication Sig Dispense Refill  . albuterol (VENTOLIN HFA) 108 (90 Base) MCG/ACT inhaler Inhale 2 puffs into the lungs every 6 (six) hours as needed for wheezing or  shortness of breath. 8 g 0  . ibuprofen (ADVIL) 800 MG tablet Take 1 tablet (800 mg total) by mouth every 8 (eight) hours as needed. 90 tablet 0  . Melatonin 10 MG TABS Take by mouth daily as needed.    . propranolol (INDERAL) 10 MG tablet Take 1 tablet (10 mg total) by mouth 3 (three) times daily as needed. for anxiety 60 tablet 11  . spironolactone (ALDACTONE) 25 MG tablet Take 25 mg by mouth daily.     No current facility-administered medications for this visit.    Allergies:   Patient has no known allergies.    Social History:  The patient  reports that she has been smoking cigarettes. She has been smoking about 0.50 packs per day. She has never used smokeless tobacco. She reports current alcohol use. She reports that she does not use drugs.   Family History:  The patient's family history includes Diabetes in her father; Hypertension in her mother.    ROS:  Please see the history of present illness.   Otherwise, review of systems are positive for none.   All other systems are reviewed and negative.    PHYSICAL EXAM: VS:  BP 118/80   Pulse 67   Ht 5\' 6"  (1.676 m)   Wt 123 lb (55.8 kg)   BMI 19.85 kg/m  ,  BMI Body mass index is 19.85 kg/m. GENERAL:  Well appearing HEENT:  Pupils equal round and reactive, fundi not visualized, oral mucosa unremarkable NECK:  No jugular venous distention, waveform within normal limits, carotid upstroke brisk and symmetric, no bruits, no thyromegaly LYMPHATICS:  No cervical, inguinal adenopathy LUNGS:  Clear to auscultation bilaterally BACK:  No CVA tenderness CHEST:  Unremarkable HEART:  PMI not displaced or sustained,S1 and S2 within normal limits, no S3, no S4, no clicks, no rubs, no murmurs ABD:  Flat, positive bowel sounds normal in frequency in pitch, no bruits, no rebound, no guarding, no midline pulsatile mass, no hepatomegaly, no splenomegaly EXT:  2 plus pulses throughout, no edema, no cyanosis no clubbing SKIN:  No rashes no  nodules NEURO:  Cranial nerves II through XII grossly intact, motor grossly intact throughout PSYCH:  Cognitively intact, oriented to person place and time    EKG:  EKG is ordered today. The ekg ordered today demonstrates sinus rhythm, rate 67, axis within normal limits, intervals within normal limits, poor anterior R wave progression likely lead placement   Recent Labs: 09/20/2020: ALT 17; BUN 10; Creatinine, Ser 0.90; Hemoglobin 14.0; Platelets 157; Potassium 3.4; Sodium 137; TSH 2.000    Lipid Panel No results found for: CHOL, TRIG, HDL, CHOLHDL, VLDL, LDLCALC, LDLDIRECT    Wt Readings from Last 3 Encounters:  11/17/20 123 lb (55.8 kg)  09/20/20 126 lb (57.2 kg)  02/25/20 118 lb (53.5 kg)      Other studies Reviewed: Additional studies/ records that were reviewed today include: Labs. Review of the above records demonstrates:  Please see elsewhere in the note.     ASSESSMENT AND PLAN:  PALPITATIONS:    I am going to have her wear a 2-week monitor.  I agree with the propranolol 3 times a day.  Further treatment will be based on the results of the monitor.  I suspect that a lot of this is related to anxiety and I suggested referral to psychiatry/psychology.  HTN: Her blood pressure was a little bit elevated today at her work screening but at target today.  I would continue the meds as listed.  HYPERTRIGLYCERIDEMIA: We talked about diet control.  Her triglycerides this morning at work was 291.   Current medicines are reviewed at length with the patient today.  The patient does not have concerns regarding medicines.  The following changes have been made:  no change  Labs/ tests ordered today include:   Orders Placed This Encounter  Procedures  . LONG TERM MONITOR (3-14 DAYS)  . EKG 12-Lead     Disposition:   FU with me as needed   Signed, Rollene Rotunda, MD  11/17/2020 10:45 AM    [facility]

## 2020-11-17 ENCOUNTER — Other Ambulatory Visit: Payer: Self-pay

## 2020-11-17 ENCOUNTER — Ambulatory Visit (INDEPENDENT_AMBULATORY_CARE_PROVIDER_SITE_OTHER): Payer: 59 | Admitting: Cardiology

## 2020-11-17 ENCOUNTER — Encounter: Payer: Self-pay | Admitting: *Deleted

## 2020-11-17 ENCOUNTER — Encounter: Payer: Self-pay | Admitting: Cardiology

## 2020-11-17 ENCOUNTER — Ambulatory Visit (INDEPENDENT_AMBULATORY_CARE_PROVIDER_SITE_OTHER): Payer: 59

## 2020-11-17 VITALS — BP 118/80 | HR 67 | Ht 66.0 in | Wt 123.0 lb

## 2020-11-17 DIAGNOSIS — R002 Palpitations: Secondary | ICD-10-CM

## 2020-11-17 DIAGNOSIS — R000 Tachycardia, unspecified: Secondary | ICD-10-CM

## 2020-11-17 NOTE — Patient Instructions (Signed)
Medication Instructions:  The current medical regimen is effective;  continue present plan and medications.  *If you need a refill on your cardiac medications before your next appointment, please call your pharmacy*  Testing/Procedures: ZIO XT- Long Term Monitor Instructions   Your physician has requested you wear your ZIO patch monitor 14 days.   This is a single patch monitor.  [employer] supplies one patch monitor per enrollment.  Additional stickers are not available.   Please do not apply patch if you will be having a Nuclear Stress Test, Echocardiogram, Cardiac CT, MRI, or Chest Xray during the time frame you would be wearing the monitor. The patch cannot be worn during these tests.  You cannot remove and re-apply the ZIO XT patch monitor.   Your ZIO patch monitor will be sent USPS Priority mail from [employer] directly to your home address. The monitor may also be mailed to a PO BOX if home delivery is not available.   It may take 3-5 days to receive your monitor after you have been enrolled.   Once you have received you monitor, please review enclosed instructions.  Your monitor has already been registered assigning a specific monitor serial # to you.   Applying the monitor   Shave hair from upper left chest.   Hold abrader disc by orange tab.  Rub abrader in 40 strokes over left upper chest as indicated in your monitor instructions.   Clean area with 4 enclosed alcohol pads .  Use all pads to assure are is cleaned thoroughly.  Let dry.   Apply patch as indicated in monitor instructions.  Patch will be place under collarbone on left side of chest with arrow pointing upward.   Rub patch adhesive wings for 2 minutes.Remove white label marked "1".  Remove white label marked "2".  Rub patch adhesive wings for 2 additional minutes.   While looking in a mirror, press and release button in center of patch.  A small green light will flash 3-4 times .  This will be your only  indicator the monitor has been turned on.     Do not shower for the first 24 hours.  You may shower after the first 24 hours.   Press button if you feel a symptom. You will hear a small click.  Record Date, Time and Symptom in the Patient Log Book.   When you are ready to remove patch, follow instructions on last 2 pages of Patient Log Book.  Stick patch monitor onto last page of Patient Log Book.   Place Patient Log Book in Blue box.  Use locking tab on box and tape box closed securely.  The Orange and White box has prepaid postage on it.  Please place in mailbox as soon as possible.  Your physician should have your test results approximately 7 days after the monitor has been mailed back to [employer].   Call [employer] Customer Care at [phone] if you have questions regarding your ZIO XT patch monitor.  Call them immediately if you see an orange light blinking on your monitor.   If your monitor falls off in less than 4 days contact our Monitor department at [phone].  If your monitor becomes loose or falls off after 4 days call [employer] at [phone] for suggestions on securing your monitor.   Follow-Up: At [facility], you and your health needs are our priority.  As part of our continuing mission to provide you with exceptional heart care, we have   created designated Provider Care Teams.  These Care Teams include your primary Cardiologist (physician) and Advanced Practice Providers (APPs -  Physician Assistants and Nurse Practitioners) who all work together to provide you with the care you need, when you need it.  We recommend signing up for the patient portal called "MyChart".  Sign up information is provided on this After Visit Summary.  MyChart is used to connect with patients for Virtual Visits (Telemedicine).  Patients are able to view lab/test results, encounter notes, upcoming appointments, etc.  Non-urgent messages can be sent to your provider as well.   To learn  more about what you can do with MyChart, go to [email or web address].    Your next appointment:   Follow up with Dr Antoine Poche as needed.  Thank you for choosing [facility]!!

## 2020-11-17 NOTE — Progress Notes (Signed)
Patient ID: Brandy Vega, female   DOB: [birth year], 45 y.o.   MRN: [id number] Patient enrolled for [employer] to ship a 14 day ZIO XT long term holter monitor to her home.

## 2020-11-22 DIAGNOSIS — R000 Tachycardia, unspecified: Secondary | ICD-10-CM | POA: Diagnosis not present

## 2020-11-22 DIAGNOSIS — R002 Palpitations: Secondary | ICD-10-CM

## 2020-12-15 ENCOUNTER — Telehealth: Payer: Self-pay | Admitting: Cardiology

## 2020-12-15 NOTE — Telephone Encounter (Signed)
Spoke with Brandy Vega, she reports 3 episodes of SVT on the 2 weeks monitor the patient wore. The longest episode was 1 min. The report is now available. Will wait for review by provider.

## 2020-12-15 NOTE — Telephone Encounter (Signed)
    Brittney with [employer] calling to report abnormal zio result

## 2020-12-20 ENCOUNTER — Encounter: Payer: Self-pay | Admitting: Family

## 2020-12-20 ENCOUNTER — Ambulatory Visit (INDEPENDENT_AMBULATORY_CARE_PROVIDER_SITE_OTHER): Payer: 59 | Admitting: Family

## 2020-12-20 ENCOUNTER — Other Ambulatory Visit: Payer: Self-pay

## 2020-12-20 VITALS — BP 113/78 | HR 71 | Temp 96.9°F | Ht 66.0 in | Wt 120.8 lb

## 2020-12-20 DIAGNOSIS — F17200 Nicotine dependence, unspecified, uncomplicated: Secondary | ICD-10-CM

## 2020-12-20 DIAGNOSIS — N390 Urinary tract infection, site not specified: Secondary | ICD-10-CM

## 2020-12-20 DIAGNOSIS — F411 Generalized anxiety disorder: Secondary | ICD-10-CM

## 2020-12-20 DIAGNOSIS — Z1159 Encounter for screening for other viral diseases: Secondary | ICD-10-CM

## 2020-12-20 DIAGNOSIS — R002 Palpitations: Secondary | ICD-10-CM

## 2020-12-20 DIAGNOSIS — F332 Major depressive disorder, recurrent severe without psychotic features: Secondary | ICD-10-CM

## 2020-12-20 DIAGNOSIS — F1020 Alcohol dependence, uncomplicated: Secondary | ICD-10-CM

## 2020-12-20 DIAGNOSIS — Z114 Encounter for screening for human immunodeficiency virus [HIV]: Secondary | ICD-10-CM

## 2020-12-20 MED ORDER — ESCITALOPRAM OXALATE 10 MG PO TABS: 10.0000 mg | ORAL_TABLET | Freq: Every day | ORAL | 3 refills | Status: DC

## 2020-12-20 NOTE — Progress Notes (Signed)
Subjective:    Patient ID: Brandy Vega, female    DOB: [birth year], 45 y.o.   MRN: [id number]  Chief Complaint  Patient presents with  . Follow-up    Urgent care follow up BP was low and uti and dehydrated    PT presents to the office today for for urgent care follow up. She was seen in the Urgent Care on 12/07/20 and found to be hypotensive and dehydrated. She had been taking her propanolol 10 mg TID for GAD. She also was found to have a UTI and started on Keflex. She states her UTI symptoms have resolved.   States her fatigue, lightheaded, and dizziness resolved.   She reports she continues to drink 3-4 beers on her day off.   She is followed by Cardiologists and do a 2 week Holter monitor for palpitations.   Anxiety Presents for follow-up visit. Symptoms include excessive worry, irritability, panic and restlessness. Symptoms occur most days. The severity of symptoms is moderate.    Nicotine Dependence Presents for follow-up visit. Symptoms include irritability. Her urge triggers include company of smokers. The symptoms have been stable. She smokes < 1/2 a pack of cigarettes per day.      Review of Systems  Constitutional: Positive for irritability.  All other systems reviewed and are negative.      Objective:   Physical Exam Vitals reviewed.  Constitutional:      General: She is not in acute distress.    Appearance: She is well-developed.  HENT:     Head: Normocephalic and atraumatic.     Right Ear: Tympanic membrane normal.     Left Ear: Tympanic membrane normal.  Eyes:     Pupils: Pupils are equal, round, and reactive to light.  Neck:     Thyroid: No thyromegaly.  Cardiovascular:     Rate and Rhythm: Normal rate and regular rhythm.     Heart sounds: Normal heart sounds. No murmur heard.   Pulmonary:     Effort: Pulmonary effort is normal. No respiratory distress.     Breath sounds: Normal breath sounds. No wheezing.  Abdominal:     General: Bowel sounds  are normal. There is no distension.     Palpations: Abdomen is soft.     Tenderness: There is no abdominal tenderness.  Musculoskeletal:        General: No tenderness. Normal range of motion.     Cervical back: Normal range of motion and neck supple.  Skin:    General: Skin is warm and dry.  Neurological:     Mental Status: She is alert and oriented to person, place, and time.     Cranial Nerves: No cranial nerve deficit.     Deep Tendon Reflexes: Reflexes are normal and symmetric.  Psychiatric:        Behavior: Behavior normal.        Thought Content: Thought content normal.        Judgment: Judgment normal.          BP 113/78   Pulse 71   Temp (!) 96.9 F (36.1 C) (Temporal)   Ht _0  (1.676 m)   Wt 120 lb 12.8 oz (54.8 kg)   BMI 19.50 kg/m   Assessment & Plan:  Brandy Vega comes in today with chief complaint of Follow-up (Urgent care follow up BP was low and uti and dehydrated )   Diagnosis and orders addressed:  1. GAD (generalized anxiety disorder) Start Lexapro 10 mg today  Stress management  - CMP14+EGFR - CBC with Differential/Platelet - escitalopram (LEXAPRO) 10 MG tablet; Take 1 tablet (10 mg total) by mouth daily.  Dispense: 90 tablet; Refill: 3  2. Severe episode of recurrent major depressive disorder, without psychotic features ([place]) - CMP14+EGFR - CBC with Differential/Platelet - escitalopram (LEXAPRO) 10 MG tablet; Take 1 tablet (10 mg total) by mouth daily.  Dispense: 90 tablet; Refill: 3  3. Palpitations - CMP14+EGFR - CBC with Differential/Platelet  4. Current smoker - CMP14+EGFR - CBC with Differential/Platelet  5. Alcohol use disorder, severe, dependence (HCC) - CMP14+EGFR - CBC with Differential/Platelet  6. Encounter for screening for HIV - HIV Antibody (routine testing w rflx) - CMP14+EGFR - CBC with Differential/Platelet  7. Need for hepatitis C screening test - Hepatitis C antibody - CMP14+EGFR - CBC with  Differential/Platelet  8. Urinary tract infection without hematuria, site unspecified Continue Keflex  Labs pending Health Maintenance reviewed Diet and exercise encouraged  Follow up plan: 1 month    [name], FNP

## 2020-12-20 NOTE — Patient Instructions (Signed)
[email or web address]">  Generalized Anxiety Disorder, Adult Generalized anxiety disorder (GAD) is a mental health condition. Unlike normal worries, anxiety related to GAD is not triggered by a specific event. These worries do not fade or get better with time. GAD interferes with relationships, work, and school. GAD symptoms can vary from mild to severe. People with severe GAD can have intense waves of anxiety with physical symptoms that are similar to panic attacks. What are the causes? The exact cause of GAD is not known, but the following are believed to have an impact:  Differences in natural brain chemicals.  Genes passed down from parents to children.  Differences in the way threats are perceived.  Development during childhood.  Personality. What increases the risk? The following factors may make you more likely to develop this condition:  Being female.  Having a family history of anxiety disorders.  Being very shy.  Experiencing very stressful life events, such as the death of a loved one.  Having a very stressful family environment. What are the signs or symptoms? People with GAD often worry excessively about many things in their lives, such as their health and family. Symptoms may also include:  Mental and emotional symptoms: ? Worrying excessively about natural disasters. ? Fear of being late. ? Difficulty concentrating. ? Fears that others are judging your performance.  Physical symptoms: ? Fatigue. ? Headaches, muscle tension, muscle twitches, trembling, or feeling shaky. ? Feeling like your heart is pounding or beating very fast. ? Feeling out of breath or like you cannot take a deep breath. ? Having trouble falling asleep or staying asleep, or experiencing restlessness. ? Sweating. ? Nausea, diarrhea, or irritable bowel syndrome (IBS).  Behavioral symptoms: ? Experiencing erratic moods or irritability. ? Avoidance of new situations. ? Avoidance of  people. ? Extreme difficulty making decisions. How is this diagnosed? This condition is diagnosed based on your symptoms and medical history. You will also have a physical exam. Your health care provider may perform tests to rule out other possible causes of your symptoms. To be diagnosed with GAD, a person must have anxiety that:  Is out of his or her control.  Affects several different aspects of his or her life, such as work and relationships.  Causes distress that makes him or her unable to take part in normal activities.  Includes at least three symptoms of GAD, such as restlessness, fatigue, trouble concentrating, irritability, muscle tension, or sleep problems. Before your health care provider can confirm a diagnosis of GAD, these symptoms must be present more days than they are not, and they must last for 6 months or longer. How is this treated? This condition may be treated with:  Medicine. Antidepressant medicine is usually prescribed for long-term daily control. Anti-anxiety medicines may be added in severe cases, especially when panic attacks occur.  Talk therapy (psychotherapy). Certain types of talk therapy can be helpful in treating GAD by providing support, education, and guidance. Options include: ? Cognitive behavioral therapy (CBT). People learn coping skills and self-calming techniques to ease their physical symptoms. They learn to identify unrealistic thoughts and behaviors and to replace them with more appropriate thoughts and behaviors. ? Acceptance and commitment therapy (ACT). This treatment teaches people how to be mindful as a way to cope with unwanted thoughts and feelings. ? Biofeedback. This process trains you to manage your body's response (physiological response) through breathing techniques and relaxation methods. You will work with a therapist while machines are used to monitor your physical   symptoms.  Stress management techniques. These include yoga,  meditation, and exercise. A mental health specialist can help determine which treatment is best for you. Some people see improvement with one type of therapy. However, other people require a combination of therapies.   Follow these instructions at home: Lifestyle  Maintain a consistent routine and schedule.  Anticipate stressful situations. Create a plan, and allow extra time to work with your plan.  Practice stress management or self-calming techniques that you have learned from your therapist or your health care provider. General instructions  Take over-the-counter and prescription medicines only as told by your health care provider.  Understand that you are likely to have setbacks. Accept this and be kind to yourself as you persist to take better care of yourself.  Recognize and accept your accomplishments, even if you judge them as small.  Keep all follow-up visits as told by your health care provider. This is important. Contact a health care provider if:  Your symptoms do not get better.  Your symptoms get worse.  You have signs of depression, such as: ? A persistently sad or irritable mood. ? Loss of enjoyment in activities that used to bring you joy. ? Change in weight or eating. ? Changes in sleeping habits. ? Avoiding friends or family members. ? Loss of energy for normal tasks. ? Feelings of guilt or worthlessness. Get help right away if:  You have serious thoughts about hurting yourself or others. If you ever feel like you may hurt yourself or others, or have thoughts about taking your own life, get help right away. Go to your nearest emergency department or:  Call your local emergency services (911 in the [place]  Call a suicide crisis helpline, such as the National Suicide Prevention Lifeline at 1-800-273-8255. This is open 24 hours a day in the [place]  Text the Crisis Text Line at 741741 (in the [place] Summary  Generalized anxiety disorder (GAD) is a mental  health condition that involves worry that is not triggered by a specific event.  People with GAD often worry excessively about many things in their lives, such as their health and family.  GAD may cause symptoms such as restlessness, trouble concentrating, sleep problems, frequent sweating, nausea, diarrhea, headaches, and trembling or muscle twitching.  A mental health specialist can help determine which treatment is best for you. Some people see improvement with one type of therapy. However, other people require a combination of therapies. This information is not intended to replace advice given to you by your health care provider. Make sure you discuss any questions you have with your health care provider. Document Revised: 06/04/2019 Document Reviewed: 06/04/2019 Elsevier Patient Education  2021 [employer].  

## 2020-12-21 LAB — HEPATITIS C ANTIBODY: Hep C Virus Ab: <0.1 s/co ratio (ref 0.0–0.9)

## 2020-12-21 LAB — HIV ANTIBODY (ROUTINE TESTING W REFLEX): HIV Screen 4th Generation wRfx: NONREACTIVE

## 2020-12-21 LAB — CMP14+EGFR
ALT: 11 IU/L (ref 0–32)
AST: 13 IU/L (ref 0–40)
Albumin/Globulin Ratio: 1.8 (ref 1.2–2.2)
Albumin: 4.7 g/dL (ref 3.8–4.8)
Alkaline Phosphatase: 61 IU/L (ref 44–121)
BUN/Creatinine Ratio: 9 (ref 9–23)
BUN: 10 mg/dL (ref 6–24)
Bilirubin Total: 0.6 mg/dL (ref 0.0–1.2)
CO2: 18 mmol/L — ABNORMAL LOW (ref 20–29)
Calcium: 9.6 mg/dL (ref 8.7–10.2)
Chloride: 106 mmol/L (ref 96–106)
Creatinine, Ser: 1.16 mg/dL — ABNORMAL HIGH (ref 0.57–1.00)
Globulin, Total: 2.6 g/dL (ref 1.5–4.5)
Glucose: 95 mg/dL (ref 65–99)
Potassium: 4.6 mmol/L (ref 3.5–5.2)
Sodium: 140 mmol/L (ref 134–144)
Total Protein: 7.3 g/dL (ref 6.0–8.5)
eGFR: 60 mL/min/{1.73_m2} (ref >59)

## 2020-12-21 LAB — CBC WITH DIFFERENTIAL/PLATELET
Basophils Absolute: 0.1 10*3/uL (ref 0.0–0.2)
Basos: 1 %
EOS (ABSOLUTE): 0.1 10*3/uL (ref 0.0–0.4)
Eos: 1 %
Hematocrit: 40.2 % (ref 34.0–46.6)
Hemoglobin: 13.4 g/dL (ref 11.1–15.9)
Immature Grans (Abs): 0 10*3/uL (ref 0.0–0.1)
Immature Granulocytes: 0 %
Lymphocytes Absolute: 1.5 10*3/uL (ref 0.7–3.1)
Lymphs: 17 %
MCH: 29.7 pg (ref 26.6–33.0)
MCHC: 33.3 g/dL (ref 31.5–35.7)
MCV: 89 fL (ref 79–97)
Monocytes Absolute: 0.7 10*3/uL (ref 0.1–0.9)
Monocytes: 8 %
Neutrophils Absolute: 6.7 10*3/uL (ref 1.4–7.0)
Neutrophils: 73 %
Platelets: 263 10*3/uL (ref 150–450)
RBC: 4.51 x10E6/uL (ref 3.77–5.28)
RDW: 12.9 % (ref 11.7–15.4)
WBC: 9.1 10*3/uL (ref 3.4–10.8)

## 2020-12-27 ENCOUNTER — Encounter: Payer: Self-pay | Admitting: Family Medicine

## 2021-01-18 ENCOUNTER — Encounter: Payer: Self-pay | Admitting: *Deleted

## 2021-01-21 ENCOUNTER — Other Ambulatory Visit: Payer: Self-pay

## 2021-01-21 ENCOUNTER — Encounter: Payer: Self-pay | Admitting: Family

## 2021-01-21 ENCOUNTER — Ambulatory Visit (INDEPENDENT_AMBULATORY_CARE_PROVIDER_SITE_OTHER): Payer: 59 | Admitting: Family

## 2021-01-21 VITALS — BP 120/79 | HR 74 | Temp 97.6°F | Ht 66.0 in | Wt 121.8 lb

## 2021-01-21 DIAGNOSIS — F17200 Nicotine dependence, unspecified, uncomplicated: Secondary | ICD-10-CM | POA: Diagnosis not present

## 2021-01-21 DIAGNOSIS — F411 Generalized anxiety disorder: Secondary | ICD-10-CM | POA: Diagnosis not present

## 2021-01-21 DIAGNOSIS — F339 Major depressive disorder, recurrent, unspecified: Secondary | ICD-10-CM | POA: Diagnosis not present

## 2021-01-21 DIAGNOSIS — Z23 Encounter for immunization: Secondary | ICD-10-CM

## 2021-01-21 DIAGNOSIS — F332 Major depressive disorder, recurrent severe without psychotic features: Secondary | ICD-10-CM

## 2021-01-21 MED ORDER — ESCITALOPRAM OXALATE 20 MG PO TABS: 20.0000 mg | ORAL_TABLET | Freq: Every day | ORAL | 1 refills | Status: DC

## 2021-01-21 MED ORDER — MELATONIN 10 MG PO TABS: 10.0000 mg | ORAL_TABLET | Freq: Every day | ORAL | 1 refills | Status: DC | PRN

## 2021-01-21 NOTE — Progress Notes (Signed)
Subjective:    Patient ID: Brandy Vega, female    DOB: October 18, 1975, 45 y.o.   MRN: [id number]  Chief Complaint  Patient presents with  . Follow-up    1 mth on anxiety    PT presents to the office today to recheck GAD and Depression.   She reports she continues to drink 3-4 beers on her day off.   She is followed by Cardiologists and completed a 2 week Holter monitor for palpitations.   Anxiety Presents for follow-up visit. Symptoms include decreased concentration, depressed mood, excessive worry, irritability, nervous/anxious behavior and restlessness. Symptoms occur most days. The severity of symptoms is moderate.    Depression        This is a chronic problem.  The current episode started more than 1 year ago.   The onset quality is gradual.   The problem occurs intermittently.  Associated symptoms include decreased concentration, irritable, restlessness and sad.  Associated symptoms include no helplessness and no hopelessness.  Past treatments include SSRIs - Selective serotonin reuptake inhibitors.  Past medical history includes anxiety.       Review of Systems  Constitutional: Positive for irritability.  Psychiatric/Behavioral: Positive for decreased concentration and depression. The patient is nervous/anxious.   All other systems reviewed and are negative.      Objective:   Physical Exam Vitals reviewed.  Constitutional:      General: She is irritable. She is not in acute distress.    Appearance: She is well-developed.  HENT:     Head: Normocephalic and atraumatic.     Right Ear: Tympanic membrane normal.     Left Ear: Tympanic membrane normal.  Eyes:     Pupils: Pupils are equal, round, and reactive to light.  Neck:     Thyroid: No thyromegaly.  Cardiovascular:     Rate and Rhythm: Normal rate and regular rhythm.     Heart sounds: Normal heart sounds. No murmur heard.   Pulmonary:     Effort: Pulmonary effort is normal. No respiratory distress.      Breath sounds: Normal breath sounds. No wheezing.  Abdominal:     General: Bowel sounds are normal. There is no distension.     Palpations: Abdomen is soft.     Tenderness: There is no abdominal tenderness.  Musculoskeletal:        General: No tenderness. Normal range of motion.     Cervical back: Normal range of motion and neck supple.  Skin:    General: Skin is warm and dry.  Neurological:     Mental Status: She is alert and oriented to person, place, and time.     Cranial Nerves: No cranial nerve deficit.     Deep Tendon Reflexes: Reflexes are normal and symmetric.  Psychiatric:        Behavior: Behavior normal.        Thought Content: Thought content normal.        Judgment: Judgment normal.       BP 120/79   Pulse 74   Temp 97.6 F (36.4 C) (Temporal)   Ht 5\' 6"  (1.676 m)   Wt 121 lb 12.8 oz (55.2 kg)   BMI 19.66 kg/m      Assessment & Plan:  Brandy Vega comes in today with chief complaint of Follow-up (1 mth on anxiety )   Diagnosis and orders addressed:  1. Depression, recurrent (HCC) - Melatonin 10 MG TABS; Take 10 mg by mouth daily as needed.  Dispense:  90 tablet; Refill: 1 - escitalopram (LEXAPRO) 20 MG tablet; Take 1 tablet (20 mg total) by mouth daily.  Dispense: 90 tablet; Refill: 1  2. GAD (generalized anxiety disorder) Will increase to Lexapro to 20 mg to 10 mg  Stress management  RTO in 2 months  - escitalopram (LEXAPRO) 20 MG tablet; Take 1 tablet (20 mg total) by mouth daily.  Dispense: 90 tablet; Refill: 1  3. Severe episode of recurrent major depressive disorder, without psychotic features (HCC) - escitalopram (LEXAPRO) 20 MG tablet; Take 1 tablet (20 mg total) by mouth daily.  Dispense: 90 tablet; Refill: 1  4. Current smoker Smoking cessation discussed    Health Maintenance reviewed- TDAP given  Diet and exercise encouraged  Follow up plan: 2 months   [name], FNP

## 2021-01-21 NOTE — Patient Instructions (Signed)
Major Depressive Disorder, Adult Major depressive disorder (MDD) is a mental health condition. It may also be called clinical depression or unipolar depression. MDD causes symptoms of sadness, hopelessness, and loss of interest in things. These symptoms last most of the day, almost every day, for 2 weeks. MDD can also cause physical symptoms. It can interfere with relationships and with everyday activities, such as work, school, and activities that are usually pleasant. MDD may be mild, moderate, or severe. It may be single-episode MDD, which happens once, or recurrent MDD, which may occur multiple times. What are the causes? The exact cause of this condition is not known. MDD is most likely caused by a combination of things, which may include:  Your personality traits.  Learned or conditioned behaviors or thoughts or feelings that reinforce negativity.  Any alcohol or substance misuse.  Long-term (chronic) physical or mental health illness.  Going through a traumatic experience or major life changes. What increases the risk? The following factors may make someone more likely to develop MDD:  A family history of depression.  Being a woman.  Troubled family relationships.  Abnormally low levels of certain brain chemicals.  Traumatic or painful events in childhood, especially abuse or loss of a parent.  A lot of stress from life experiences, such as poor living conditions or discrimination.  Chronic physical illness or other mental health disorders. What are the signs or symptoms? The main symptoms of MDD usually include:  Constant depressed or irritable mood.  A loss of interest in things and activities. Other symptoms include:  Sleeping or eating too much or too little.  Unexplained weight gain or weight loss.  Tiredness or low energy.  Being agitated, restless, or weak.  Feeling hopeless, worthless, or guilty.  Trouble thinking clearly or making  decisions.  Thoughts of suicide or thoughts of harming others.  Isolating oneself or avoiding other people or activities.  Trouble completing tasks, work, or any normal obligations. Severe symptoms of this condition may include:  Psychotic depression.This may include false beliefs, or delusions. It may also include seeing, hearing, tasting, smelling, or feeling things that are not real (hallucinations).  Chronic depression or persistent depressive disorder. This is low-level depression that lasts for at least 2 years.  Melancholic depression, or feeling extremely sad and hopeless.  Catatonic depression, which includes trouble speaking and trouble moving. How is this diagnosed? This condition may be diagnosed based on:  Your symptoms.  Your medical and mental health history. You may be asked questions about your lifestyle, including any drug and alcohol use.  A physical exam.  Blood tests to rule out other conditions. MDD is confirmed if you have the following symptoms most of the day, nearly every day, in a 2-week period:  Either a depressed mood or loss of interest.  At least four other MDD symptoms. How is this treated? This condition is usually treated by mental health professionals, such as psychologists, psychiatrists, and clinical social workers. You may need more than one type of treatment. Treatment may include:  Psychotherapy, also called talk therapy or counseling. Types of psychotherapy include: ? Cognitive behavioral therapy (CBT). This teaches you to recognize unhealthy feelings, thoughts, and behaviors, and replace them with positive thoughts and actions. ? Interpersonal therapy (IPT). This helps you to improve the way you communicate with others or relate to them. ? Family therapy. This treatment includes members of your family.  Medicines to treat anxiety and depression. These medicines help to balance the brain chemicals   that affect your emotions.  Lifestyle  changes. You may be asked to: ? Limit alcohol use and avoid drug use. ? Get regular exercise. ? Get plenty of sleep. ? Make healthy eating choices. ? Spend more time outdoors.  Brain stimulation. This may be done if symptoms are very severe and other treatments have not worked. Examples of this treatment are electroconvulsive therapy and transcranial magnetic stimulation. Follow these instructions at home: Activity  Exercise regularly and spend time outdoors.  Find activities that you enjoy doing, and make time to do them.  Find healthy ways to manage stress, such as: ? Meditation or deep breathing. ? Spending time in nature. ? Journaling.  Return to your normal activities as told by your health care provider. Ask your health care provider what activities are safe for you. Alcohol and drug use  If you drink alcohol: ? Limit how much you use to:  0-1 drink a day for women who are not pregnant.  0-2 drinks a day for men. ? Be aware of how much alcohol is in your drink. In the [place] one drink equals one 12 oz bottle of beer (355 mL), one 5 oz glass of wine (148 mL), or one 1 oz glass of hard liquor (44 mL). ? Discuss your alcohol use with your health care provider. Alcohol can affect any antidepressant medicines you are taking.  Discuss any drug use with your health care provider. General instructions  Take over-the-counter and prescription medicines only as told by your health care provider.  Eat a healthy diet and get plenty of sleep.  Consider joining a support group. Your health care provider may be able to recommend one.  Keep all follow-up visits as told by your health care provider. This is important.   Where to find more information  National Alliance on Mental Illness: www.nami.org  [place] National Institute of Mental Health: www.nimh.nih.gov Contact a health care provider if:  Your symptoms get worse.  You develop new symptoms. Get help right away if:  You  self-harm.  You have serious thoughts about hurting yourself or others.  You hallucinate. If you ever feel like you may hurt yourself or others, or have thoughts about taking your own life, get help right away. Go to your nearest emergency department or:  Call your local emergency services (911 in the [place]  Call a suicide crisis helpline, such as the National Suicide Prevention Lifeline at 1-800-273-8255. This is open 24 hours a day in the [place]  Text the Crisis Text Line at 741741 (in the [place] Summary  Major depressive disorder (MDD) is a mental health condition. MDD causes symptoms of sadness, hopelessness, and loss of interest in things. These symptoms last most of the day, almost every day, for 2 weeks.  The symptoms of MDD can interfere with relationships and with everyday activities.  Treatments and support are available for people who develop MDD. You may need more than one type of treatment.  Get help right away if you have serious thoughts about hurting yourself or others. This information is not intended to replace advice given to you by your health care provider. Make sure you discuss any questions you have with your health care provider. Document Revised: 07/26/2019 Document Reviewed: 07/26/2019 Elsevier Patient Education  2021 Elsevier Inc.  

## 2021-01-23 ENCOUNTER — Other Ambulatory Visit: Payer: Self-pay | Admitting: Family

## 2021-01-23 DIAGNOSIS — M542 Cervicalgia: Secondary | ICD-10-CM

## 2021-01-25 NOTE — Telephone Encounter (Signed)
Last office visit 01/21/21 Last refill 06/01/20, #90, no refills

## 2021-01-31 ENCOUNTER — Other Ambulatory Visit: Payer: Self-pay

## 2021-01-31 ENCOUNTER — Telehealth: Payer: Self-pay | Admitting: Cardiology

## 2021-01-31 DIAGNOSIS — R000 Tachycardia, unspecified: Secondary | ICD-10-CM

## 2021-01-31 NOTE — Telephone Encounter (Signed)
PT CALLED IN WANTING TO [facility] ULTRASOUND PER DR HOCHREINS REQ VIA LETTER, THERE  IS NO ORDER ON FILE SO I AM UNABLE TO SCH. PLEASE CONTACT PT AND ADVISE OF NEXT STEPS

## 2021-01-31 NOTE — Telephone Encounter (Signed)
I did review monitor results:   Per Dr.Hochrein-   Rollene Rotunda, MD  01/01/2021  7:29 PM EDT      Wide complex tachycardia likely supraventricular tachycardia with aberrant conduction.  I would like to check an echocardiogram and see her back after this.  Call Ms. Lordi with the results and send results to Junie Spencer, FNP    Ordered ECHO- ordered to be completed in [place], please schedule.   Thank you!

## 2021-03-02 ENCOUNTER — Ambulatory Visit (HOSPITAL_COMMUNITY)
Admission: RE | Admit: 2021-03-02 | Discharge: 2021-03-02 | Disposition: A | Discharge status: Home / Self Care | Payer: 59 | Source: Ambulatory Visit | Attending: Cardiology | Admitting: Cardiology

## 2021-03-02 ENCOUNTER — Other Ambulatory Visit: Payer: Self-pay

## 2021-03-02 DIAGNOSIS — I471 Supraventricular tachycardia: Secondary | ICD-10-CM

## 2021-03-02 DIAGNOSIS — R000 Tachycardia, unspecified: Secondary | ICD-10-CM

## 2021-03-02 LAB — ECHOCARDIOGRAM COMPLETE
Area-P 1/2: 2.14 cm2
S' Lateral: 2.4 cm

## 2021-03-02 NOTE — Progress Notes (Signed)
*  PRELIMINARY RESULTS* Echocardiogram 2D Echocardiogram has been performed.  Stacey Drain 03/02/2021, 2:49 PM

## 2021-03-10 ENCOUNTER — Telehealth: Payer: Self-pay | Admitting: Family

## 2021-03-10 NOTE — Telephone Encounter (Signed)
Appt made with hawks for Monday

## 2021-03-14 ENCOUNTER — Encounter: Payer: Self-pay | Admitting: Family

## 2021-03-14 ENCOUNTER — Other Ambulatory Visit: Payer: Self-pay

## 2021-03-14 ENCOUNTER — Ambulatory Visit (INDEPENDENT_AMBULATORY_CARE_PROVIDER_SITE_OTHER): Payer: 59 | Admitting: Family

## 2021-03-14 VITALS — BP 148/76 | HR 76 | Temp 98.6°F | Ht 66.0 in | Wt 118.4 lb

## 2021-03-14 DIAGNOSIS — F17200 Nicotine dependence, unspecified, uncomplicated: Secondary | ICD-10-CM | POA: Diagnosis not present

## 2021-03-14 DIAGNOSIS — R55 Syncope and collapse: Secondary | ICD-10-CM

## 2021-03-14 DIAGNOSIS — F1020 Alcohol dependence, uncomplicated: Secondary | ICD-10-CM | POA: Diagnosis not present

## 2021-03-14 NOTE — Progress Notes (Signed)
Subjective:    Patient ID: Brandy Vega, female    DOB: 11/20/1975, 45 y.o.   MRN: [id number]  Chief Complaint  Patient presents with   Loss of Consciousness    Last week at work    Pt presents to the office today with syncope episode. She has had multiple times of syncope episodes and has seen Neurologists and Cardiologists and was "cleared". She had a Ct head on 02/24/20 that was negative. She had a long term monitor that showed SVT and an ECHO that was normal on 03/02/21.   She reports on 03/09/21 she was at work and was walking to the bathroom and just fell out. She denies any dizziness. She does not remember anything that happened, but was told she was out for several mins. EMS was called and did EKG and Vs that were stable.   She reports she has decreased her alcohol to a "couple of beers" a week.  Loss of Consciousness This is a recurrent problem. The current episode started in the past 7 days.     Review of Systems  Cardiovascular:  Positive for syncope.  All other systems reviewed and are negative.     Objective:   Physical Exam Vitals reviewed.  Constitutional:      General: She is not in acute distress.    Appearance: She is well-developed.  HENT:     Head: Normocephalic and atraumatic.     Right Ear: Tympanic membrane normal.     Left Ear: Tympanic membrane normal.  Eyes:     Pupils: Pupils are equal, round, and reactive to light.  Neck:     Thyroid: No thyromegaly.  Cardiovascular:     Rate and Rhythm: Normal rate and regular rhythm.     Heart sounds: Normal heart sounds. No murmur heard. Pulmonary:     Effort: Pulmonary effort is normal. No respiratory distress.     Breath sounds: Normal breath sounds. No wheezing.  Abdominal:     General: Bowel sounds are normal. There is no distension.     Palpations: Abdomen is soft.     Tenderness: There is no abdominal tenderness.  Musculoskeletal:        General: No tenderness. Normal range of motion.      Cervical back: Normal range of motion and neck supple.  Skin:    General: Skin is warm and dry.  Neurological:     Mental Status: She is alert and oriented to person, place, and time.     Cranial Nerves: No cranial nerve deficit.     Deep Tendon Reflexes: Reflexes are normal and symmetric.  Psychiatric:        Behavior: Behavior normal.        Thought Content: Thought content normal.        Judgment: Judgment normal.      BP (!) 148/76   Pulse 76   Temp 98.6 F (37 C) (Temporal)   Ht _0  (1.676 m)   Wt 118 lb 6.4 oz (53.7 kg)   SpO2 99%   BMI 19.11 kg/m      Assessment & Plan:  [name] comes in today with chief complaint of Loss of Consciousness (Last week at work )   Diagnosis and orders addressed:  1. Syncope, unspecified syncope type - EKG 12-Lead - CMP14+EGFR - Anemia Profile B  2. Alcohol use disorder, severe, dependence ([place]) - CMP14+EGFR - Anemia Profile B  3. 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  Discussed she needs to call the Cardiologists and Neurologists and have them to be cleared. Recommended she find another job that does not require her to work around Midwife. Labs pending Health Maintenance reviewed Diet and exercise encouraged  Follow up plan: 3 months  [name], FNP

## 2021-03-14 NOTE — Patient Instructions (Signed)
Syncope Syncope refers to a condition in which a person temporarily loses consciousness. Syncope may also be called fainting or passing out. It is caused by a sudden decrease in blood flow to the brain. Even though most causes of syncope are not dangerous, syncope can be a sign of a serious medical problem. Your health care provider may do tests to find the reason why you are having syncope. Signs that you may be about to faint include: Feeling dizzy or light-headed. Feeling nauseous. Seeing all white or all black in your field of vision. Having cold, clammy skin. If you faint, get medical help right away. Call your local emergency services (911 in the [place] Do not drive yourself to the hospital. Follow these instructions at home: Pay attention to any changes in your symptoms. Take these actions to stay safe and to help relieve your symptoms: Lifestyle Do not drive, use machinery, or play sports until your health care provider says it is okay. Do not drink alcohol. Do not use any products that contain nicotine or tobacco, such as cigarettes and e-cigarettes. If you need help quitting, ask your health care provider. Drink enough fluid to keep your urine pale yellow. General instructions Take over-the-counter and prescription medicines only as told by your health care provider. If you are taking blood pressure or heart medicine, get up slowly and take several minutes to sit and then stand. This can reduce dizziness or light-headedness. Have someone stay with you until you feel stable. If you start to feel like you might faint, lie down right away and raise (elevate) your feet above the level of your heart. Breathe deeply and steadily. Wait until all the symptoms have passed. Keep all follow-up visits as told by your health care provider. This is important. Get help right away if you: Have a severe headache. Faint once or repeatedly. Have pain in your chest, abdomen, or back. Have a very fast  or irregular heartbeat (palpitations). Have pain when you breathe. Are bleeding from your mouth or rectum, or you have black or tarry stool. Have a seizure. Are confused. Have trouble walking. Have severe weakness. Have vision problems. These symptoms may represent a serious problem that is an emergency. Do not wait to see if your symptoms will go away. Get medical help right away. Call your local emergency services (911 in the [place] Do not drive yourself to the hospital. Summary Syncope refers to a condition in which a person temporarily loses consciousness. It is caused by a sudden decrease in blood flow to the brain. Signs that you may be about to faint include dizziness, feeling light-headed, feeling nauseous, sudden vision changes, or cold, clammy skin. Although most causes of syncope are not dangerous, syncope can be a sign of a serious medical problem. If you faint, get medical help right away. This information is not intended to replace advice given to you by your health care provider. Make sure you discuss any questions you have with your health care provider. Document Revised: 11/25/2019 Document Reviewed: 12/25/2019 Elsevier Patient Education  2022 Elsevier Inc.  

## 2021-03-15 LAB — CMP14+EGFR
ALT: 9 IU/L (ref 0–32)
AST: 22 IU/L (ref 0–40)
Albumin/Globulin Ratio: 2.4 — ABNORMAL HIGH (ref 1.2–2.2)
Albumin: 5 g/dL — ABNORMAL HIGH (ref 3.8–4.8)
Alkaline Phosphatase: 75 IU/L (ref 44–121)
BUN/Creatinine Ratio: 9 (ref 9–23)
BUN: 8 mg/dL (ref 6–24)
Bilirubin Total: 1.4 mg/dL — ABNORMAL HIGH (ref 0.0–1.2)
CO2: 20 mmol/L (ref 20–29)
Calcium: 10.1 mg/dL (ref 8.7–10.2)
Chloride: 99 mmol/L (ref 96–106)
Creatinine, Ser: 0.91 mg/dL (ref 0.57–1.00)
Globulin, Total: 2.1 g/dL (ref 1.5–4.5)
Glucose: 110 mg/dL — ABNORMAL HIGH (ref 65–99)
Potassium: 4.1 mmol/L (ref 3.5–5.2)
Sodium: 138 mmol/L (ref 134–144)
Total Protein: 7.1 g/dL (ref 6.0–8.5)
eGFR: 80 mL/min/{1.73_m2} (ref >59)

## 2021-03-15 LAB — ANEMIA PROFILE B
Basophils Absolute: 0.1 10*3/uL (ref 0.0–0.2)
Basos: 1 %
EOS (ABSOLUTE): 0.1 10*3/uL (ref 0.0–0.4)
Eos: 1 %
Ferritin: 100 ng/mL (ref 15–150)
Folate: 2.6 ng/mL — ABNORMAL LOW (ref >3.0)
Hematocrit: 43.4 % (ref 34.0–46.6)
Hemoglobin: 14.2 g/dL (ref 11.1–15.9)
Immature Grans (Abs): 0 10*3/uL (ref 0.0–0.1)
Immature Granulocytes: 0 %
Iron Saturation: 60 % — ABNORMAL HIGH (ref 15–55)
Iron: 213 ug/dL — ABNORMAL HIGH (ref 27–159)
Lymphocytes Absolute: 2 10*3/uL (ref 0.7–3.1)
Lymphs: 20 %
MCH: 29.3 pg (ref 26.6–33.0)
MCHC: 32.7 g/dL (ref 31.5–35.7)
MCV: 90 fL (ref 79–97)
Monocytes Absolute: 0.7 10*3/uL (ref 0.1–0.9)
Monocytes: 7 %
Neutrophils Absolute: 7.4 10*3/uL — ABNORMAL HIGH (ref 1.4–7.0)
Neutrophils: 71 %
Platelets: 228 10*3/uL (ref 150–450)
RBC: 4.85 x10E6/uL (ref 3.77–5.28)
RDW: 12.6 % (ref 11.7–15.4)
Retic Ct Pct: 1.2 % (ref 0.6–2.6)
Total Iron Binding Capacity: 353 ug/dL (ref 250–450)
UIBC: 140 ug/dL (ref 131–425)
Vitamin B-12: 371 pg/mL (ref 232–1245)
WBC: 10.3 10*3/uL (ref 3.4–10.8)

## 2021-03-23 ENCOUNTER — Ambulatory Visit: Payer: 59 | Admitting: Family

## 2021-03-25 IMAGING — CT CT HEAD W/O CM
3 of 5 series · 16 of 47 positions shown, 19 images · non-contrast
Comparison: None.

CLINICAL DATA: Seizure with slurred speech and ataxia

EXAM:
CT HEAD WITHOUT CONTRAST
TECHNIQUE: Contiguous axial images were obtained from the base of the skull
through the vertex without intravenous contrast.

[Series 2: head w o · axial · 0.41mm/px · z∈[+54,+184]mm · 10 of 30 slices shown, 13 images]
[im 2/30  brain]
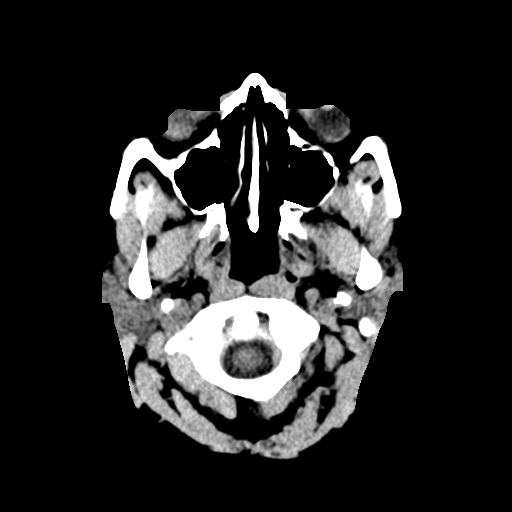
[im 2/30  bone]
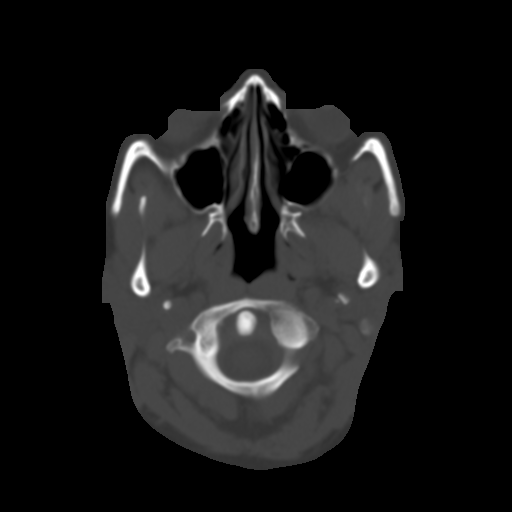
[im 6/30  brain]
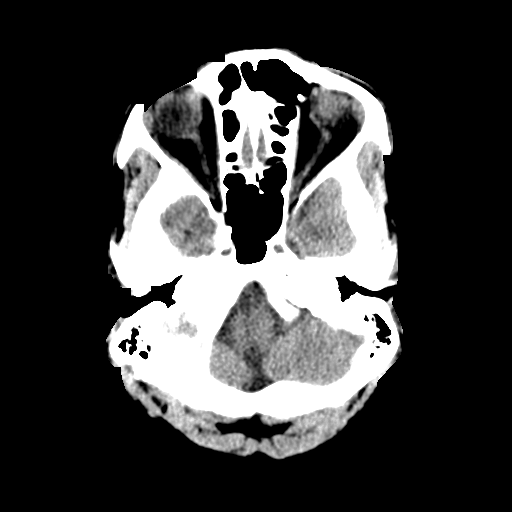
[im 8/30  brain]
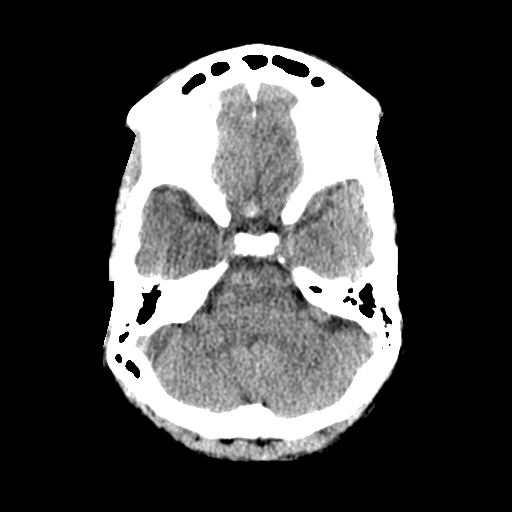
[im 11/30  brain]
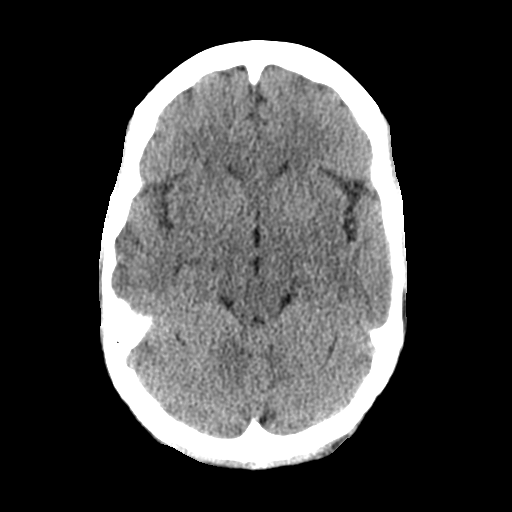
[im 13/30  brain]
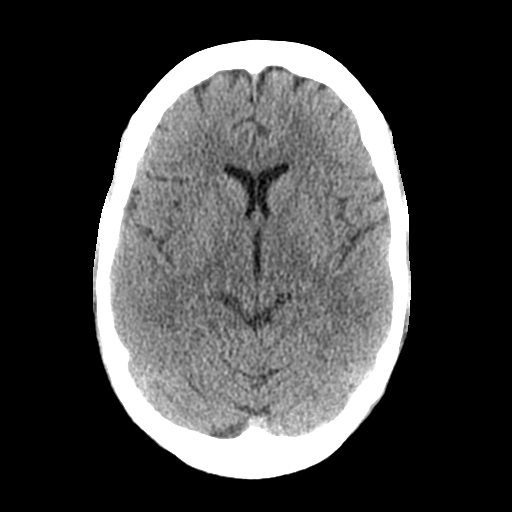
[im 13/30  bone]
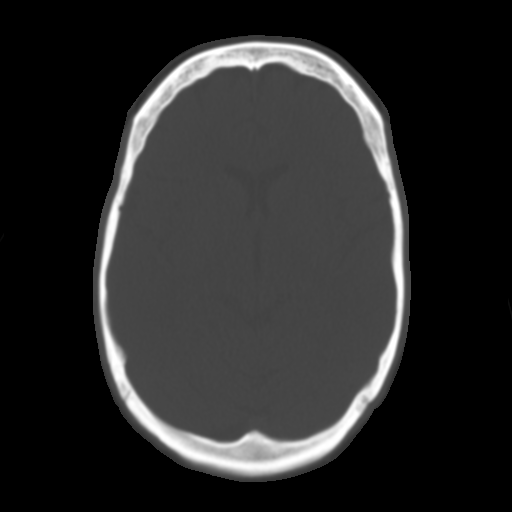
[im 17/30  brain]
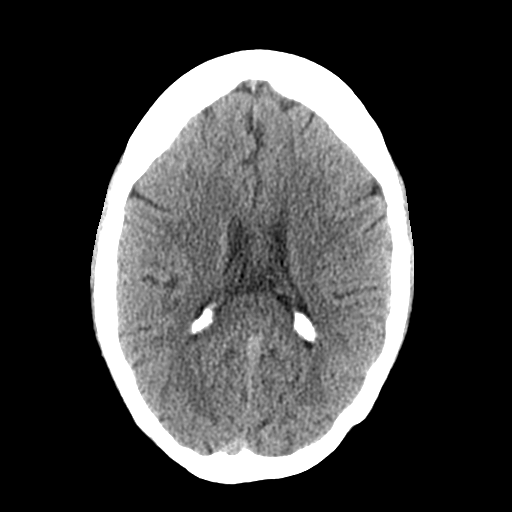
[im 19/30  brain]
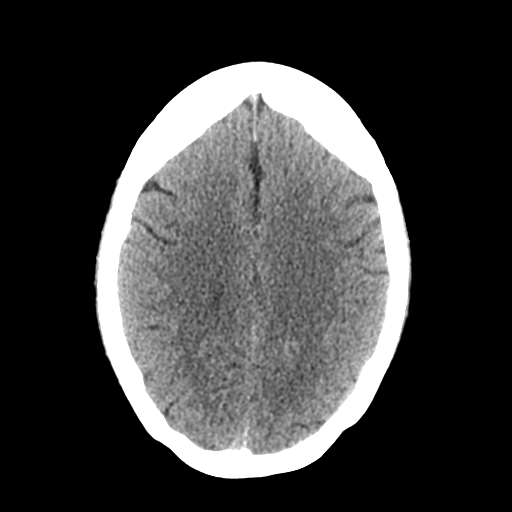
[im 22/30  brain]
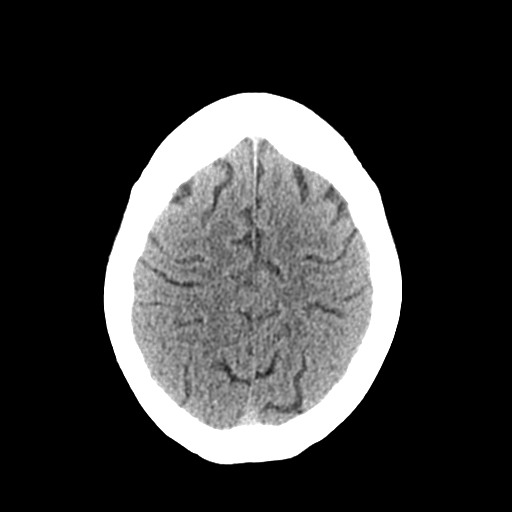
[im 24/30  brain]
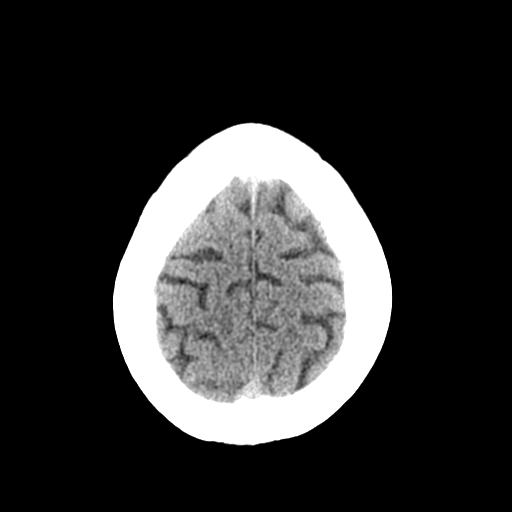
[im 24/30  bone]
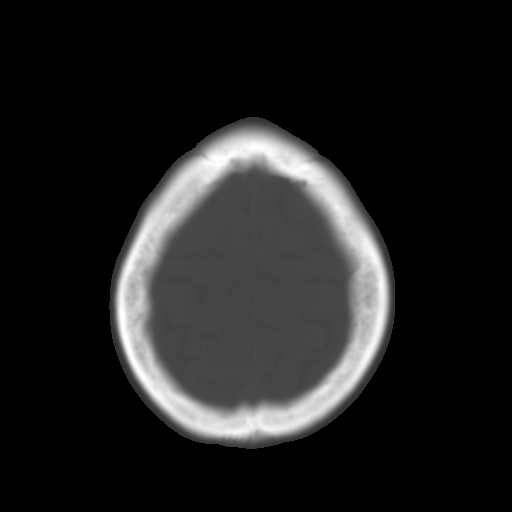
[im 28/30  brain]
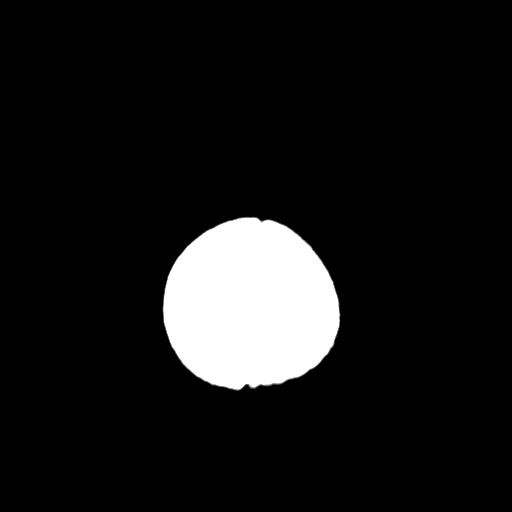

[Series 4: coronal soft · coronal · 0.30mm/px · 3 of 67 slices shown]
[im 23/67  brain]
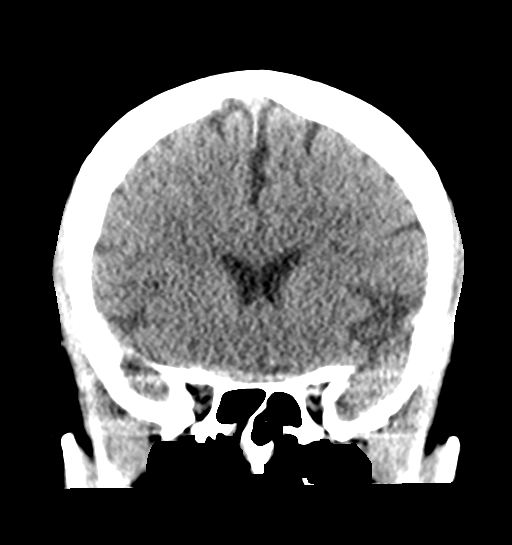
[im 30/67  brain]
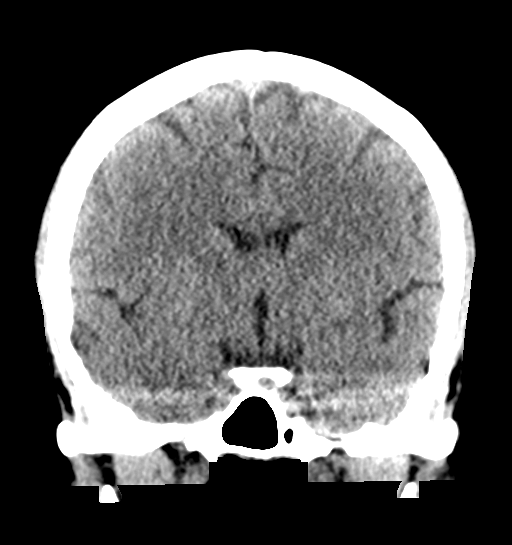
[im 37/67  brain]
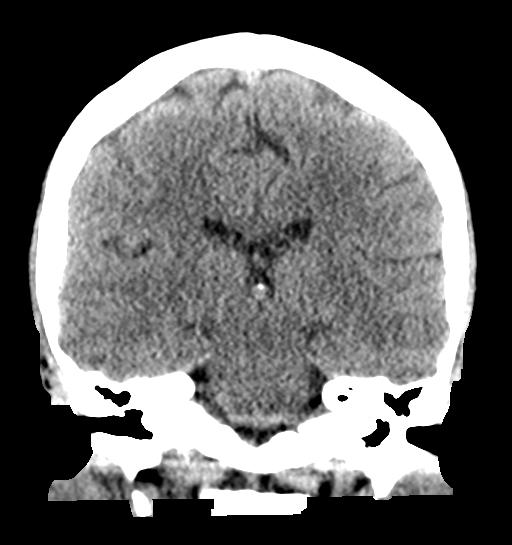

[Series 5: sagittal soft · sagittal · 0.32mm/px · 3 of 52 slices shown]
[im 18/52  brain]
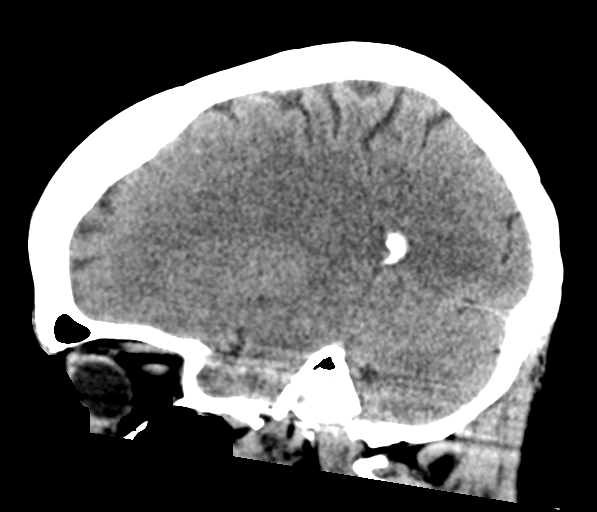
[im 26/52  brain]
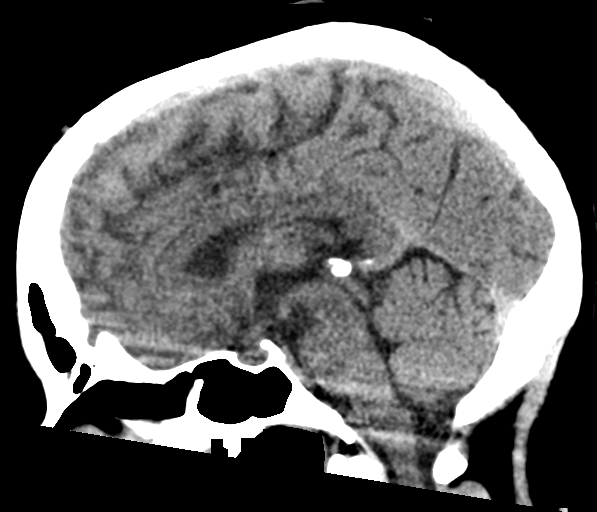
[im 35/52  brain]
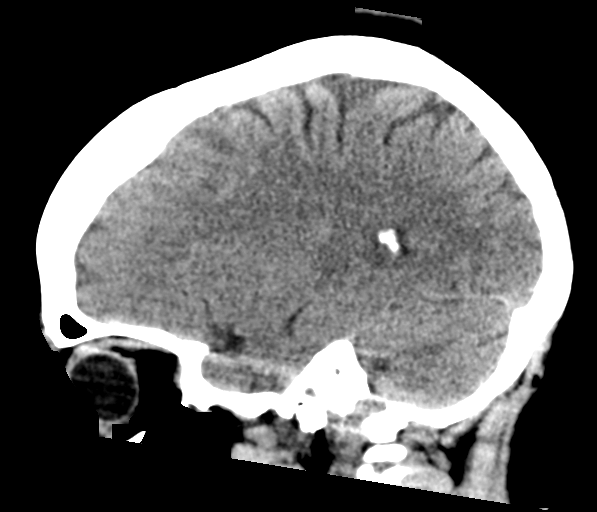

[16 of 47 positions shown; findings below may reference images not displayed]

FINDINGS: Brain: Ventricles and sulci are normal in size and configuration.
There is no intracranial mass, hemorrhage, extra-axial fluid
collection, or midline shift. Brain parenchyma appears unremarkable.
No acute infarct evident.

Vascular: No hyperdense vessel.  No evident vascular calcification.

Skull: The bony calvarium appears intact.

Sinuses/Orbits: There is mucosal thickening in several ethmoid air
cells. Other visualized paranasal sinuses are clear. Visualized
orbits appear symmetric bilaterally.

Other: Mastoid air cells are clear.
IMPRESSION: Mucosal thickening in several ethmoid air cells. Study otherwise
unremarkable.

## 2021-05-06 NOTE — Progress Notes (Deleted)
Cardiology Clinic Note   Patient Name: Brandy Vega Date of Encounter: 05/06/2021  Primary Care Provider:  Junie Spencer, FNP Primary Cardiologist:  Rollene Rotunda, MD  Patient Profile    Brandy Vega 45 year old female presents the clinic today for follow-up evaluation of her tachycardia and palpitations.  Past Medical History    Past Medical History:  Diagnosis Date   Bipolar 1 disorder (HCC)    Essential hypertension 09/05/2018   GAD (generalized anxiety disorder) 06/25/2019   MDD (major depressive disorder), recurrent episode, severe (HCC) 03/18/2018   Past Surgical History:  Procedure Laterality Date   DILATION AND CURETTAGE OF UTERUS      Allergies  No Known Allergies  History of Present Illness    [name] has a PMH of palpitations and tachycardia.  She was previously referred by her PCP for an evaluation of her palpitations.  She reported that she had panic attacks.  She was labeled with having an anxiety disorder and received beta-blocker therapy.  She previously reported that while she was at work she had to contact EMS due to her anxiety.  She reported shortness of breath with the episode.  Dr. Antoine Poche reviewed her previous EKGs with her which she reported were okay.  She denied triggers.  She did however report that she would occasionally become panicky at night and would feel her heart rate increased.  She was seen by neurology and was told to take propranolol 3 times daily.  She reported that with the propranolol therapy she has felt better.  She did indicate that her symptoms included rapid and strong heartbeats.  She denied substernal chest discomfort, arm, and neck discomfort.  She denied shortness of breath with her anxiety/panic attacks.  She reported being fairly active in her job and walking around 5000 steps per day.  With her increased physical activity she does not notice fast or accelerated heartbeats.  A cardiac event monitor was ordered and  showed normal sinus rhythm, sinus tachycardia, wide-complex tachycardia 12/15/2020. Echocardiogram 03/02/21 was normal.  She presents to the clinic today for follow up evaluation and states***  *** denies chest pain, shortness of breath, lower extremity edema, fatigue, palpitations, melena, hematuria, hemoptysis, diaphoresis, weakness, presyncope, syncope, orthopnea, and PND.  Home Medications    Prior to Admission medications   Medication Sig Start Date End Date Taking? Authorizing Provider  albuterol (VENTOLIN HFA) 108 (90 Base) MCG/ACT inhaler Inhale 2 puffs into the lungs every 6 (six) hours as needed for wheezing or shortness of breath. 09/07/20   Daphine Deutscher, Mary-Margaret, FNP  escitalopram (LEXAPRO) 20 MG tablet Take 1 tablet (20 mg total) by mouth daily. 01/21/21   Junie Spencer, FNP  hydrOXYzine (ATARAX/VISTARIL) 25 MG tablet Take one to two po q six hours prn anxiety 12/07/20   [provider]  ibuprofen (ADVIL) 800 MG tablet TAKE 1 TABLET BY MOUTH EVERY 8 HOURS AS NEEDED 01/26/21   Gwenlyn Fudge, FNP  Melatonin 10 MG TABS Take 10 mg by mouth daily as needed. 01/21/21   Junie Spencer, FNP    Family History    Family History  Problem Relation Age of Onset   Hypertension Mother    Diabetes Father    She indicated that her mother is alive. She indicated that her father is deceased.  Social History    Social History   Socioeconomic History   Marital status: Single    Spouse name: Not on file   Number of children: Not on  file   Years of education: Not on file   Highest education level: Not on file  Occupational History   Not on file  Tobacco Use   Smoking status: Every Day    Packs/day: 0.50    Types: Cigarettes   Smokeless tobacco: Never  Vaping Use   Vaping Use: Never used  Substance and Sexual Activity   Alcohol use: Yes    Comment: occ. use   Drug use: No   Sexual activity: Yes    Birth control/protection: None  Other Topics Concern   Not on file   Social History Narrative   Lives with boyfriend.     Social Determinants of Health   Financial Resource Strain: Not on file  Food Insecurity: Not on file  Transportation Needs: Not on file  Physical Activity: Not on file  Stress: Not on file  Social Connections: Not on file  Intimate Partner Violence: Not on file     Review of Systems    General:  No chills, fever, night sweats or weight changes.  Cardiovascular:  No chest pain, dyspnea on exertion, edema, orthopnea, palpitations, paroxysmal nocturnal dyspnea. Dermatological: No rash, lesions/masses Respiratory: No cough, dyspnea Urologic: No hematuria, dysuria Abdominal:   No nausea, vomiting, diarrhea, bright red blood per rectum, melena, or hematemesis Neurologic:  No visual changes, wkns, changes in mental status. All other systems reviewed and are otherwise negative except as noted above.  Physical Exam    VS:  There were no vitals taken for this visit. , BMI There is no height or weight on file to calculate BMI. GEN: Well nourished, well developed, in no acute distress. HEENT: normal. Neck: Supple, no JVD, carotid bruits, or masses. Cardiac: RRR, no murmurs, rubs, or gallops. No clubbing, cyanosis, edema.  Radials/DP/PT 2+ and equal bilaterally.  Respiratory:  Respirations regular and unlabored, clear to auscultation bilaterally. GI: Soft, nontender, nondistended, BS + x 4. MS: no deformity or atrophy. Skin: warm and dry, no rash. Neuro:  Strength and sensation are intact. Psych: Normal affect.  Accessory Clinical Findings    Recent Labs: 09/20/2020: TSH 2.000 03/14/2021: ALT 9; BUN 8; Creatinine, Ser 0.91; Hemoglobin 14.2; Platelets 228; Potassium 4.1; Sodium 138   Recent Lipid Panel No results found for: CHOL, TRIG, HDL, CHOLHDL, VLDL, LDLCALC, LDLDIRECT  ECG personally reviewed by me today- *** - No acute changes  Cardiac event monitor 12/15/2020 Normal sinus rhythm, sinus tachycardia, wide-complex  tachycardia with aberrant supraventricular tachycardia  Echocardiogram 03/02/2021 IMPRESSIONS     1. Left ventricular ejection fraction, by estimation, is 60 to 65%. The  left ventricle has normal function. The left ventricle has no regional  wall motion abnormalities. Left ventricular diastolic parameters were  normal.   2. Right ventricular systolic function is normal. The right ventricular  size is normal.   3. The mitral valve is normal in structure. No evidence of mitral valve  regurgitation. No evidence of mitral stenosis.   4. The aortic valve is normal in structure. Aortic valve regurgitation is  not visualized. No aortic stenosis is present.   5. The inferior vena cava is normal in size with greater than 50%  respiratory variability, suggesting right atrial pressure of 3 mmHg.   Assessment & Plan   1.  Palpitations-reports less frequent palpitations. Continue Atarax Heart healthy low-sodium diet-salty 6 given Increase physical activity as tolerated Avoid triggers Caffine, chocolate, ETOH, dehydration, etc.  Tachycardia-no recent episodes of increased heart rate or irregular heartbeats.  Cardiac event monitor showed  sinus tachycardia, normal sinus rhythm, and wide-complex tachycardia with supraventricular tachycardia.  Echocardiogram 03/02/2021 was normal. Contine Lexapro, Atarax Mindfulness stress reduction  Disposition: Follow up with Dr. Kirtland Bouchard in 4-6 months  Thomasene Ripple. Jonnae Fonseca NP-C    05/06/2021, 7:14 AM [facility] [address] Office [phone] Fax [phone]  Notice: This dictation was prepared with Dragon dictation along with smaller phrase technology. Any transcriptional errors that result from this process are unintentional and may not be corrected upon review.  I spent***minutes examining this patient, reviewing medications, and using patient centered shared decision making involving her cardiac care.  Prior to her  visit I spent greater than 20 minutes reviewing her past medical history,  medications, and prior cardiac tests.

## 2021-05-09 ENCOUNTER — Ambulatory Visit: Payer: 59 | Admitting: General Practice

## 2021-05-18 ENCOUNTER — Telehealth: Payer: Self-pay | Admitting: Family

## 2021-05-18 ENCOUNTER — Encounter: Payer: Self-pay | Admitting: Family

## 2021-05-18 ENCOUNTER — Ambulatory Visit (INDEPENDENT_AMBULATORY_CARE_PROVIDER_SITE_OTHER): Payer: 59 | Admitting: Family

## 2021-05-18 DIAGNOSIS — F17200 Nicotine dependence, unspecified, uncomplicated: Secondary | ICD-10-CM | POA: Diagnosis not present

## 2021-05-18 DIAGNOSIS — R059 Cough, unspecified: Secondary | ICD-10-CM

## 2021-05-18 MED ORDER — MOLNUPIRAVIR EUA 200MG CAPSULE: 4.0000 | ORAL_CAPSULE | Freq: Two times a day (BID) | ORAL | 0 refills | Status: AC

## 2021-05-18 NOTE — Telephone Encounter (Signed)
Pt calling back to let Neysa Bonito know that her at home covid test came back positive.

## 2021-05-18 NOTE — Telephone Encounter (Signed)
Patient aware and verbalized understanding. °

## 2021-05-18 NOTE — Progress Notes (Signed)
Virtual Visit  Note Due to COVID-19 pandemic this visit was conducted virtually. This visit type was conducted due to national recommendations for restrictions regarding the COVID-19 Pandemic (e.g. social distancing, sheltering in place) in an effort to limit this patient's exposure and mitigate transmission in our community. All issues noted in this document were discussed and addressed.  A physical exam was not performed with this format.  I connected with Brandy Vega on 05/18/21 at 1:53 pm  by telephone and verified that I am speaking with the correct person using two identifiers. Brandy Vega is currently located at home and no one is currently with her during visit. The provider, [name], FNP is located in their office at time of visit.  I discussed the limitations, risks, security and privacy concerns of performing an evaluation and management service by telephone and the availability of in person appointments. I also discussed with the patient that there may be a patient responsible charge related to this service. The patient expressed understanding and agreed to proceed.  Brandy Vega, clutter are scheduled for a virtual visit with your provider today.    Just as we do with appointments in the office, we must obtain your consent to participate.  Your consent will be active for this visit and any virtual visit you may have with one of our providers in the next 365 days.    If you have a MyChart account, I can also send a copy of this consent to you electronically.  All virtual visits are billed to your insurance company just like a traditional visit in the office.  As this is a virtual visit, video technology does not allow for your provider to perform a traditional examination.  This may limit your provider's ability to fully assess your condition.  If your provider identifies any concerns that need to be evaluated in person or the need to arrange testing such as labs, EKG, etc, we will make  arrangements to do so.    Although advances in technology are sophisticated, we cannot ensure that it will always work on either your end or our end.  If the connection with a video visit is poor, we may have to switch to a telephone visit.  With either a video or telephone visit, we are not always able to ensure that we have a secure connection.   I need to obtain your verbal consent now.   Are you willing to proceed with your visit today?   Brandy Vega has provided verbal consent on 05/18/2021 for a virtual visit (video or telephone).   [name], [place] 05/18/2021  1:55 PM    History and Present Illness:  Pt calls the office today with cough and SOB that started yesterday.  Cough This is a new problem. The problem has been waxing and waning. The problem occurs every few minutes. The cough is Non-productive. Associated symptoms include shortness of breath. Pertinent negatives include no chills, ear congestion, ear pain, fever, headaches, myalgias, nasal congestion, postnasal drip, sore throat or wheezing. Risk factors for lung disease include smoking/tobacco exposure. She has tried rest for the symptoms. The treatment provided mild relief.     Review of Systems  Constitutional:  Negative for chills and fever.  HENT:  Negative for ear pain, postnasal drip and sore throat.   Respiratory:  Positive for cough and shortness of breath. Negative for wheezing.   Musculoskeletal:  Negative for myalgias.  Neurological:  Negative for headaches.    Observations/Objective: No  SOB or distress noted, hoarse voice and coarse cough noted  Assessment and Plan: 1. Cough  2. Current smoker  Pt will take home COVID test and let us know results today Force fluids Tylenol as needed Smoking cessation discussed     I discussed the assessment and treatment plan with the patient. The patient was provided an opportunity to ask questions and all were answered. The patient agreed with the plan and  demonstrated an understanding of the instructions.   The patient was advised to call back or seek an in-person evaluation if the symptoms worsen or if the condition fails to improve as anticipated.  The above assessment and management plan was discussed with the patient. The patient verbalized understanding of and has agreed to the management plan. Patient is aware to call the clinic if symptoms persist or worsen. Patient is aware when to return to the clinic for a follow-up visit. Patient educated on when it is appropriate to go to the emergency department.   Time call ended:  2:04 pm   I provided 11 minutes of  non face-to-face time during this encounter.    [name], FNP

## 2021-05-18 NOTE — Telephone Encounter (Signed)
Ok, I have sent in molnupiravir to your pharmacy, rest, force fluids, tylenol as needed, report any worsening symptoms such as increased shortness of breath, swelling, or continued high fevers.

## 2021-05-26 ENCOUNTER — Ambulatory Visit: Payer: 59 | Admitting: Family

## 2021-05-31 ENCOUNTER — Encounter: Payer: Self-pay | Admitting: Family

## 2021-05-31 ENCOUNTER — Other Ambulatory Visit: Payer: Self-pay

## 2021-05-31 ENCOUNTER — Ambulatory Visit (INDEPENDENT_AMBULATORY_CARE_PROVIDER_SITE_OTHER): Payer: 59 | Admitting: Family

## 2021-05-31 VITALS — BP 127/88 | HR 101 | Temp 97.4°F | Ht 66.0 in | Wt 121.4 lb

## 2021-05-31 DIAGNOSIS — F411 Generalized anxiety disorder: Secondary | ICD-10-CM | POA: Diagnosis not present

## 2021-05-31 DIAGNOSIS — I10 Essential (primary) hypertension: Secondary | ICD-10-CM

## 2021-05-31 DIAGNOSIS — F17200 Nicotine dependence, unspecified, uncomplicated: Secondary | ICD-10-CM

## 2021-05-31 DIAGNOSIS — F332 Major depressive disorder, recurrent severe without psychotic features: Secondary | ICD-10-CM

## 2021-05-31 DIAGNOSIS — R1030 Lower abdominal pain, unspecified: Secondary | ICD-10-CM | POA: Diagnosis not present

## 2021-05-31 DIAGNOSIS — R55 Syncope and collapse: Secondary | ICD-10-CM

## 2021-05-31 DIAGNOSIS — F1020 Alcohol dependence, uncomplicated: Secondary | ICD-10-CM

## 2021-05-31 LAB — MICROSCOPIC EXAMINATION: Renal Epithel, UA: NONE SEEN /hpf

## 2021-05-31 LAB — URINALYSIS, COMPLETE
Bilirubin, UA: NEGATIVE
Glucose, UA: NEGATIVE
Ketones, UA: NEGATIVE
Leukocytes,UA: NEGATIVE
Nitrite, UA: NEGATIVE
Protein,UA: NEGATIVE
Specific Gravity, UA: 1.025 (ref 1.005–1.030)
Urobilinogen, Ur: 0.2 mg/dL (ref 0.2–1.0)
pH, UA: 5 (ref 5.0–7.5)

## 2021-05-31 NOTE — Progress Notes (Signed)
Subjective:    Patient ID: Brandy Vega, female    DOB: 07/21/1976, 45 y.o.   MRN: [id number]  Chief Complaint  Patient presents with   Medical Management of Chronic Issues    Wants to be released to go back to work    Back Pain    Pelvic pain    Pt presents to the office today for chronic follow up. She is followed by Neurologists for syncope episode and ruled out seizures.  She is followed by the Cardiologist and has wore a Holter monitor for two weeks that was clear. She needs a note to return back to work.   She reports she continues to drink a "few beers" a week.  Back Pain This is a chronic problem. The current episode started more than 1 year ago. The problem occurs intermittently. The problem has been waxing and waning since onset. The pain is present in the lumbar spine. The quality of the pain is described as aching. The pain is at a severity of 8/10. The pain is moderate. Associated symptoms include abdominal pain. She has tried NSAIDs for the symptoms. The treatment provided mild relief.  Hypertension This is a chronic problem. The current episode started more than 1 year ago. The problem has been resolved since onset. The problem is controlled. Associated symptoms include anxiety. Pertinent negatives include no malaise/fatigue, peripheral edema or shortness of breath. Risk factors for coronary artery disease include dyslipidemia and obesity. The current treatment provides moderate improvement.  Depression        This is a chronic problem.  The current episode started more than 1 year ago.   The problem occurs intermittently.  Associated symptoms include helplessness, hopelessness and restlessness.  Associated symptoms include not sad.  Past treatments include SSRIs - Selective serotonin reuptake inhibitors.  Past medical history includes anxiety.   Anxiety Presents for follow-up visit. Symptoms include depressed mood, irritability, nervous/anxious behavior and restlessness.  Patient reports no shortness of breath. The quality of sleep is good.    Nicotine Dependence Presents for follow-up visit. Symptoms include irritability. Her urge triggers include company of smokers. The symptoms have been stable. She smokes < 1/2 a pack of cigarettes per day.  Abdominal Pain This is a new problem. The current episode started in the past 7 days. The onset quality is gradual. The pain is located in the RLQ. Associated symptoms include hematuria. Associated symptoms comments: Urinary frequency .     Review of Systems  Constitutional:  Positive for irritability. Negative for malaise/fatigue.  Respiratory:  Negative for shortness of breath.   Gastrointestinal:  Positive for abdominal pain.  Genitourinary:  Positive for hematuria.  Musculoskeletal:  Positive for back pain.  Psychiatric/Behavioral:  Positive for depression. The patient is nervous/anxious.   All other systems reviewed and are negative.     Objective:   Physical Exam Vitals reviewed.  Constitutional:      General: She is not in acute distress.    Appearance: She is well-developed.  HENT:     Head: Normocephalic and atraumatic.     Right Ear: Tympanic membrane normal.     Left Ear: Tympanic membrane normal.  Eyes:     Pupils: Pupils are equal, round, and reactive to light.  Neck:     Thyroid: No thyromegaly.  Cardiovascular:     Rate and Rhythm: Normal rate and regular rhythm.     Heart sounds: Normal heart sounds. No murmur heard. Pulmonary:     Effort: Pulmonary  effort is normal. No respiratory distress.     Breath sounds: Normal breath sounds. No wheezing.  Abdominal:     General: Bowel sounds are normal. There is no distension.     Palpations: Abdomen is soft.     Tenderness: There is no abdominal tenderness.  Musculoskeletal:        General: No tenderness. Normal range of motion.     Cervical back: Normal range of motion and neck supple.  Skin:    General: Skin is warm and dry.   Neurological:     Mental Status: She is alert and oriented to person, place, and time.     Cranial Nerves: No cranial nerve deficit.     Deep Tendon Reflexes: Reflexes are normal and symmetric.  Psychiatric:        Behavior: Behavior normal.        Thought Content: Thought content normal.        Judgment: Judgment normal.      BP 127/88   Pulse (!) 101   Temp (!) 97.4 F (36.3 C) (Temporal)   Ht 5' 6"  (1.676 m)   Wt 121 lb 6.4 oz (55.1 kg)   BMI 19.59 kg/m      Assessment & Plan:  Tessia Kassin comes in today with chief complaint of Medical Management of Chronic Issues (Wants to be released to go back to work ) and Back Pain (Pelvic pain )   Diagnosis and orders addressed:  1. Abdominal pain -Urine negative, CBC pending  - Urinalysis, Complete - Urine Culture - CMP14+EGFR - CBC with Differential/Platelet  2. Essential hypertension  - CMP14+EGFR - CBC with Differential/Platelet  3. Severe episode of recurrent major depressive disorder, without psychotic features ([place]) - CMP14+EGFR - CBC with Differential/Platelet  4. GAD (generalized anxiety disorder) - CMP14+EGFR - CBC with Differential/Platelet  5. Alcohol use disorder, severe, dependence (HCC) - CMP14+EGFR - CBC with Differential/Platelet  6. Current smoker - CMP14+EGFR - CBC with Differential/Platelet  7. Syncope, unspecified syncope type - CMP14+EGFR - CBC with Differential/Platelet - TSH   Labs pending Health Maintenance reviewed Diet and exercise encouraged  Follow up plan: 3 months    [name], FNP

## 2021-05-31 NOTE — Patient Instructions (Signed)
Health Maintenance, Female Adopting a healthy lifestyle and getting preventive care are important in promoting health and wellness. Ask your health care provider about: The right schedule for you to have regular tests and exams. Things you can do on your own to prevent diseases and keep yourself healthy. What should I know about diet, weight, and exercise? Eat a healthy diet  Eat a diet that includes plenty of vegetables, fruits, low-fat dairy products, and lean protein. Do not eat a lot of foods that are high in solid fats, added sugars, or sodium. Maintain a healthy weight Body mass index (BMI) is used to identify weight problems. It estimates body fat based on height and weight. Your health care provider can help determine your BMI and help you achieve or maintain a healthy weight. Get regular exercise Get regular exercise. This is one of the most important things you can do for your health. Most adults should: Exercise for at least 150 minutes each week. The exercise should increase your heart rate and make you sweat (moderate-intensity exercise). Do strengthening exercises at least twice a week. This is in addition to the moderate-intensity exercise. Spend less time sitting. Even light physical activity can be beneficial. Watch cholesterol and blood lipids Have your blood tested for lipids and cholesterol at 43 years of age, then have this test every 5 years. Have your cholesterol levels checked more often if: Your lipid or cholesterol levels are high. You are older than 43 years of age. You are at high risk for heart disease. What should I know about cancer screening? Depending on your health history and family history, you may need to have cancer screening at various ages. This may include screening for: Breast cancer. Cervical cancer. Colorectal cancer. Skin cancer. Lung cancer. What should I know about heart disease, diabetes, and high blood pressure? Blood pressure and heart  disease High blood pressure causes heart disease and increases the risk of stroke. This is more likely to develop in people who have high blood pressure readings, are of African descent, or are overweight. Have your blood pressure checked: Every 3-5 years if you are 18-39 years of age. Every year if you are 40 years old or older. Diabetes Have regular diabetes screenings. This checks your fasting blood sugar level. Have the screening done: Once every three years after age 40 if you are at a normal weight and have a low risk for diabetes. More often and at a younger age if you are overweight or have a high risk for diabetes. What should I know about preventing infection? Hepatitis B If you have a higher risk for hepatitis B, you should be screened for this virus. Talk with your health care provider to find out if you are at risk for hepatitis B infection. Hepatitis C Testing is recommended for: Everyone born from 1945 through 1965. Anyone with known risk factors for hepatitis C. Sexually transmitted infections (STIs) Get screened for STIs, including gonorrhea and chlamydia, if: You are sexually active and are younger than 43 years of age. You are older than 43 years of age and your health care provider tells you that you are at risk for this type of infection. Your sexual activity has changed since you were last screened, and you are at increased risk for chlamydia or gonorrhea. Ask your health care provider if you are at risk. Ask your health care provider about whether you are at high risk for HIV. Your health care provider may recommend a prescription medicine   to help prevent HIV infection. If you choose to take medicine to prevent HIV, you should first get tested for HIV. You should then be tested every 3 months for as long as you are taking the medicine. Pregnancy If you are about to stop having your period (premenopausal) and you may become pregnant, seek counseling before you get  pregnant. Take 400 to 800 micrograms (mcg) of folic acid every day if you become pregnant. Ask for birth control (contraception) if you want to prevent pregnancy. Osteoporosis and menopause Osteoporosis is a disease in which the bones lose minerals and strength with aging. This can result in bone fractures. If you are 65 years old or older, or if you are at risk for osteoporosis and fractures, ask your health care provider if you should: Be screened for bone loss. Take a calcium or vitamin D supplement to lower your risk of fractures. Be given hormone replacement therapy (HRT) to treat symptoms of menopause. Follow these instructions at home: Lifestyle Do not use any products that contain nicotine or tobacco, such as cigarettes, e-cigarettes, and chewing tobacco. If you need help quitting, ask your health care provider. Do not use street drugs. Do not share needles. Ask your health care provider for help if you need support or information about quitting drugs. Alcohol use Do not drink alcohol if: Your health care provider tells you not to drink. You are pregnant, may be pregnant, or are planning to become pregnant. If you drink alcohol: Limit how much you use to 0-1 drink a day. Limit intake if you are breastfeeding. Be aware of how much alcohol is in your drink. In the [place] one drink equals one 12 oz bottle of beer (355 mL), one 5 oz glass of wine (148 mL), or one 1 oz glass of hard liquor (44 mL). General instructions Schedule regular health, dental, and eye exams. Stay current with your vaccines. Tell your health care provider if: You often feel depressed. You have ever been abused or do not feel safe at home. Summary Adopting a healthy lifestyle and getting preventive care are important in promoting health and wellness. Follow your health care provider's instructions about healthy diet, exercising, and getting tested or screened for diseases. Follow your health care provider's  instructions on monitoring your cholesterol and blood pressure. This information is not intended to replace advice given to you by your health care provider. Make sure you discuss any questions you have with your health care provider. Document Revised: 10/22/2020 Document Reviewed: 08/07/2018 Elsevier Patient Education  2022 Elsevier Inc.  

## 2021-06-01 LAB — CBC WITH DIFFERENTIAL/PLATELET
Basophils Absolute: 0.1 10*3/uL (ref 0.0–0.2)
Basos: 1 %
EOS (ABSOLUTE): 0.1 10*3/uL (ref 0.0–0.4)
Eos: 1 %
Hematocrit: 37.5 % (ref 34.0–46.6)
Hemoglobin: 12.8 g/dL (ref 11.1–15.9)
Immature Grans (Abs): 0 10*3/uL (ref 0.0–0.1)
Immature Granulocytes: 0 %
Lymphocytes Absolute: 1.5 10*3/uL (ref 0.7–3.1)
Lymphs: 14 %
MCH: 29.2 pg (ref 26.6–33.0)
MCHC: 34.1 g/dL (ref 31.5–35.7)
MCV: 85 fL (ref 79–97)
Monocytes Absolute: 0.6 10*3/uL (ref 0.1–0.9)
Monocytes: 6 %
Neutrophils Absolute: 8.2 10*3/uL — ABNORMAL HIGH (ref 1.4–7.0)
Neutrophils: 78 %
Platelets: 179 10*3/uL (ref 150–450)
RBC: 4.39 x10E6/uL (ref 3.77–5.28)
RDW: 13.1 % (ref 11.7–15.4)
WBC: 10.4 10*3/uL (ref 3.4–10.8)

## 2021-06-01 LAB — TSH: TSH: 1.01 u[IU]/mL (ref 0.450–4.500)

## 2021-06-01 LAB — CMP14+EGFR
ALT: 19 IU/L (ref 0–32)
AST: 24 IU/L (ref 0–40)
Albumin/Globulin Ratio: 2 (ref 1.2–2.2)
Albumin: 4.7 g/dL (ref 3.8–4.8)
Alkaline Phosphatase: 72 IU/L (ref 44–121)
BUN/Creatinine Ratio: 10 (ref 9–23)
BUN: 8 mg/dL (ref 6–24)
Bilirubin Total: 0.3 mg/dL (ref 0.0–1.2)
CO2: 22 mmol/L (ref 20–29)
Calcium: 9.7 mg/dL (ref 8.7–10.2)
Chloride: 104 mmol/L (ref 96–106)
Creatinine, Ser: 0.84 mg/dL (ref 0.57–1.00)
Globulin, Total: 2.3 g/dL (ref 1.5–4.5)
Glucose: 107 mg/dL — ABNORMAL HIGH (ref 70–99)
Potassium: 4 mmol/L (ref 3.5–5.2)
Sodium: 141 mmol/L (ref 134–144)
Total Protein: 7 g/dL (ref 6.0–8.5)
eGFR: 88 mL/min/{1.73_m2} (ref >59)

## 2021-06-02 LAB — URINE CULTURE: Organism ID, Bacteria: NO GROWTH

## 2021-07-25 ENCOUNTER — Encounter: Payer: Self-pay | Admitting: Cardiology

## 2021-07-25 DIAGNOSIS — E781 Pure hyperglyceridemia: Secondary | ICD-10-CM

## 2021-07-25 HISTORY — DX: Pure hyperglyceridemia: E78.1

## 2021-07-25 NOTE — Progress Notes (Signed)
Cardiology Office Note   Date:  07/27/2021   ID:  Brandy Vega, DOB Apr 12, 1976, MRN [id number]  PCP:  Junie Spencer, FNP  Cardiologist:   Rollene Rotunda, MD Referring:  Junie Spencer, FNP  Chief Complaint  Patient presents with   Loss of Consciousness       History of Present Illness: Brandy Vega is a 45 y.o. female who is referred by Junie Spencer, FNP for evaluation of palpitations and tachycardia.    She wore a monitor that demonstrated wide complex tachycardia that I suggested was SVT with aberrant conduction.   I ordered an echo which demonstrated a normal EF.     She was to follow-up sooner than this but I think transportation was an issue down to [place].  Since I last saw her she did have a syncopal episode in July.  She states she was at work.  She was not particularly stressed.  She thinks she was walking to go to the bathroom when she had a syncopal episode without prodrome.  She thinks she was down a couple of minutes.  There was not trauma.  EMS was called but she refused transport.  She has not had any presyncope or syncope since then.  This was her first syncopal episode that she can recall.  She has not had any new chest pressure, neck or arm discomfort.  She has not had any new shortness of breath, PND or orthopnea.  She does feel her heart racing unprovoked a minute or so at a time probably a couple of times per week.  Of note she was on propranolol previously for anxiety apparently but this was stopped since I saw her.  She does not think her symptoms were better or worse on the beta-blocker.   Past Medical History:  Diagnosis Date   Bipolar 1 disorder (HCC)    Essential hypertension 09/05/2018   GAD (generalized anxiety disorder) 06/25/2019   Hypertriglyceridemia 07/25/2021   MDD (major depressive disorder), recurrent episode, severe (HCC) 03/18/2018    Past Surgical History:  Procedure Laterality Date   DILATION AND CURETTAGE OF UTERUS        Current Outpatient Medications  Medication Sig Dispense Refill   albuterol (VENTOLIN HFA) 108 (90 Base) MCG/ACT inhaler Inhale 2 puffs into the lungs every 6 (six) hours as needed for wheezing or shortness of breath. 8 g 0   escitalopram (LEXAPRO) 20 MG tablet Take 1 tablet (20 mg total) by mouth daily. 90 tablet 1   hydrOXYzine (ATARAX/VISTARIL) 25 MG tablet Take one to two po q six hours prn anxiety     ibuprofen (ADVIL) 800 MG tablet TAKE 1 TABLET BY MOUTH EVERY 8 HOURS AS NEEDED 90 tablet 0   Melatonin 10 MG TABS Take 10 mg by mouth daily as needed. 90 tablet 1   No current facility-administered medications for this visit.    Allergies:   Patient has no known allergies.    ROS:  Please see the history of present illness.   Otherwise, review of systems are positive for none.   All other systems are reviewed and negative.    PHYSICAL EXAM: VS:  BP 121/85   Pulse 70   Ht 5' 5.5" (1.664 m)   Wt 121 lb (54.9 kg)   BMI 19.83 kg/m  , BMI Body mass index is 19.83 kg/m. GENERAL:  Well appearing NECK:  No jugular venous distention, waveform within normal limits, carotid upstroke brisk and symmetric, no bruits,  no thyromegaly LUNGS:  Clear to auscultation bilaterally CHEST:  Unremarkable HEART:  PMI not displaced or sustained,S1 and S2 within normal limits, no S3, no S4, no clicks, no rubs, no murmurs ABD:  Flat, positive bowel sounds normal in frequency in pitch, no bruits, no rebound, no guarding, no midline pulsatile mass, no hepatomegaly, no splenomegaly EXT:  2 plus pulses throughout, no edema, no cyanosis no clubbing   EKG:  EKG is  ordered today. The ekg ordered today demonstrates sinus rhythm, rate 70, axis within normal limits, intervals within normal limits, poor anterior R wave progression likely lead placement   Recent Labs: 05/31/2021: ALT 19; BUN 8; Creatinine, Ser 0.84; Hemoglobin 12.8; Platelets 179; Potassium 4.0; Sodium 141; TSH 1.010    Lipid Panel No  results found for: CHOL, TRIG, HDL, CHOLHDL, VLDL, LDLCALC, LDLDIRECT    Wt Readings from Last 3 Encounters:  07/27/21 121 lb (54.9 kg)  05/31/21 121 lb 6.4 oz (55.1 kg)  03/14/21 118 lb 6.4 oz (53.7 kg)      Other studies Reviewed: Additional studies/ records that were reviewed today include: Monitor. Review of the above records demonstrates:  Please see elsewhere in the note.     ASSESSMENT AND PLAN:  PALPITATIONS:    She had the monitor results as mentioned above.  There was lots of artifact on this monitor its very difficult to interpret.  It appears to be sinus tachycardia with some QRS widening and I do not see onset and offset.  However, given his syncope and this I would like her to see one of our electrophysiologist.  Of note she does not drive and has not been since before her syncopal episode.  Of note she had a normal TSH and comprehensive metabolic profile.  Year.  HTN: Her blood pressure is at target.  No change in therapy.   Current medicines are reviewed at length with the patient today.  The patient does not have concerns regarding medicines.  The following changes have been made:  None  Labs/ tests ordered today include:  None  Orders Placed This Encounter  Procedures   Ambulatory referral to Cardiac Electrophysiology   EKG 12-Lead      Disposition:   FU with me after EP (please schedule)   Signed, Rollene Rotunda, MD  07/27/2021 2:38 PM    [facility]

## 2021-07-27 ENCOUNTER — Other Ambulatory Visit: Payer: Self-pay

## 2021-07-27 ENCOUNTER — Ambulatory Visit (INDEPENDENT_AMBULATORY_CARE_PROVIDER_SITE_OTHER): Payer: 59 | Admitting: Cardiology

## 2021-07-27 ENCOUNTER — Encounter: Payer: Self-pay | Admitting: Cardiology

## 2021-07-27 VITALS — BP 121/85 | HR 70 | Ht 65.5 in | Wt 121.0 lb

## 2021-07-27 DIAGNOSIS — R55 Syncope and collapse: Secondary | ICD-10-CM | POA: Diagnosis not present

## 2021-07-27 DIAGNOSIS — E781 Pure hyperglyceridemia: Secondary | ICD-10-CM | POA: Diagnosis not present

## 2021-07-27 DIAGNOSIS — R002 Palpitations: Secondary | ICD-10-CM | POA: Diagnosis not present

## 2021-07-27 DIAGNOSIS — I10 Essential (primary) hypertension: Secondary | ICD-10-CM

## 2021-07-27 DIAGNOSIS — R000 Tachycardia, unspecified: Secondary | ICD-10-CM | POA: Diagnosis not present

## 2021-07-27 NOTE — Patient Instructions (Signed)
Medication Instructions:  The current medical regimen is effective;  continue present plan and medications.  *If you need a refill on your cardiac medications before your next appointment, please call your pharmacy*  You have been referred to Electrophysiology and will be called to be scheduled in either the [place] or [place] office.  Follow-Up: At [facility], you and your health needs are our priority.  As part of our continuing mission to provide you with exceptional heart care, we have created designated Provider Care Teams.  These Care Teams include your primary Cardiologist (physician) and Advanced Practice Providers (APPs -  Physician Assistants and Nurse Practitioners) who all work together to provide you with the care you need, when you need it.  We recommend signing up for the patient portal called "MyChart".  Sign up information is provided on this After Visit Summary.  MyChart is used to connect with patients for Virtual Visits (Telemedicine).  Patients are able to view lab/test results, encounter notes, upcoming appointments, etc.  Non-urgent messages can be sent to your provider as well.   To learn more about what you can do with MyChart, go to [email or web address].    Your next appointment:   1 year(s)  The format for your next appointment:   In Person  Provider:   Rollene Rotunda, MD    Thank you for choosing [facility]!!

## 2021-08-23 ENCOUNTER — Encounter: Payer: Self-pay | Admitting: Family

## 2021-08-23 ENCOUNTER — Ambulatory Visit (INDEPENDENT_AMBULATORY_CARE_PROVIDER_SITE_OTHER): Payer: 59 | Admitting: Family

## 2021-08-23 VITALS — BP 125/84 | HR 108 | Temp 97.8°F | Wt 116.4 lb

## 2021-08-23 DIAGNOSIS — F17200 Nicotine dependence, unspecified, uncomplicated: Secondary | ICD-10-CM | POA: Diagnosis not present

## 2021-08-23 DIAGNOSIS — F411 Generalized anxiety disorder: Secondary | ICD-10-CM | POA: Diagnosis not present

## 2021-08-23 DIAGNOSIS — J069 Acute upper respiratory infection, unspecified: Secondary | ICD-10-CM

## 2021-08-23 DIAGNOSIS — F410 Panic disorder [episodic paroxysmal anxiety] without agoraphobia: Secondary | ICD-10-CM

## 2021-08-23 MED ORDER — FLUTICASONE PROPIONATE 50 MCG/ACT NA SUSP: 2.0000 | Freq: Every day | NASAL | 6 refills | Status: DC

## 2021-08-23 MED ORDER — BUSPIRONE HCL 5 MG PO TABS
5.0000 mg | ORAL_TABLET | Freq: Three times a day (TID) | ORAL | 1 refills | Status: DC | PRN
Start: 2021-08-23 — End: 2021-11-15

## 2021-08-23 MED ORDER — CETIRIZINE HCL 10 MG PO TABS: 10.0000 mg | ORAL_TABLET | Freq: Every day | ORAL | 1 refills | Status: DC

## 2021-08-23 NOTE — Progress Notes (Signed)
Subjective:    Patient ID: Brandy Vega, female    DOB: Jan 10, 1976, 45 y.o.   MRN: [id number]  Chief Complaint  Patient presents with   Panic Attack   Pt presents to the office today to discuss panic attack. She reports the holidays make her anxiety worse. However, she was working and felt overwhelmed and had to sit down. She said everyone started checking on her and "getting in her face" and made her worse. EMS was called and was evaluated. She did not go to ED.  Anxiety Presents for follow-up visit. Symptoms include decreased concentration, depressed mood, excessive worry, hyperventilation, irritability, nervous/anxious behavior, palpitations, panic and restlessness. Patient reports no suicidal ideas. Symptoms occur occasionally. The severity of symptoms is moderate.    Nicotine Dependence Presents for follow-up visit. Symptoms include decreased concentration, irritability and sore throat. Her urge triggers include company of smokers. The symptoms have been stable. She smokes < 1/2 a pack of cigarettes per day.  URI  This is a new problem. The current episode started in the past 7 days. The problem has been gradually worsening. There has been no fever. Associated symptoms include congestion, coughing, headaches, rhinorrhea and a sore throat. Pertinent negatives include no joint pain, sinus pain or sneezing.     Review of Systems  Constitutional:  Positive for irritability.  HENT:  Positive for congestion, rhinorrhea and sore throat. Negative for sinus pain and sneezing.   Respiratory:  Positive for cough.   Cardiovascular:  Positive for palpitations.  Musculoskeletal:  Negative for joint pain.  Neurological:  Positive for headaches.  Psychiatric/Behavioral:  Positive for decreased concentration. Negative for suicidal ideas. The patient is nervous/anxious.   All other systems reviewed and are negative.     Objective:   Physical Exam Vitals reviewed.  Constitutional:       General: She is not in acute distress.    Appearance: She is well-developed.  HENT:     Head: Normocephalic and atraumatic.     Right Ear: Tympanic membrane normal.     Left Ear: Tympanic membrane normal.     Mouth/Throat:     Pharynx: Posterior oropharyngeal erythema present.  Eyes:     Pupils: Pupils are equal, round, and reactive to light.  Neck:     Thyroid: No thyromegaly.  Cardiovascular:     Rate and Rhythm: Normal rate and regular rhythm.     Heart sounds: Normal heart sounds. No murmur heard. Pulmonary:     Effort: Pulmonary effort is normal. No respiratory distress.     Breath sounds: Normal breath sounds. No wheezing.  Abdominal:     General: Bowel sounds are normal. There is no distension.     Palpations: Abdomen is soft.     Tenderness: There is no abdominal tenderness.  Musculoskeletal:        General: No tenderness. Normal range of motion.     Cervical back: Normal range of motion and neck supple.  Skin:    General: Skin is warm and dry.  Neurological:     Mental Status: She is alert and oriented to person, place, and time.     Cranial Nerves: No cranial nerve deficit.     Deep Tendon Reflexes: Reflexes are normal and symmetric.  Psychiatric:        Behavior: Behavior normal.        Thought Content: Thought content normal.        Judgment: Judgment normal.      BP 125/84  Pulse (!) 108    Temp 97.8 F (36.6 C) (Temporal)    Wt 116 lb 6.4 oz (52.8 kg)    BMI 19.08 kg/m      Assessment & Plan:   Brandy Vega comes in today with chief complaint of Panic Attack   Diagnosis and orders addressed:  1. GAD (generalized anxiety disorder) - busPIRone (BUSPAR) 5 MG tablet; Take 1 tablet (5 mg total) by mouth 3 (three) times daily as needed.  Dispense: 90 tablet; Refill: 1  2. Panic attack - busPIRone (BUSPAR) 5 MG tablet; Take 1 tablet (5 mg total) by mouth 3 (three) times daily as needed.  Dispense: 90 tablet; Refill: 1  3. Current smoker  4. Viral  URI - Take meds as prescribed - Use a cool mist humidifier  -Use saline nose sprays frequently -Force fluids -For any cough or congestion  Use plain Mucinex- regular strength or max strength is fine -For fever or aces or pains- take tylenol or ibuprofen. -Throat lozenges if help - fluticasone (FLONASE) 50 MCG/ACT nasal spray; Place 2 sprays into both nostrils daily.  Dispense: 16 g; Refill: 6 - cetirizine (ZYRTEC ALLERGY) 10 MG tablet; Take 1 tablet (10 mg total) by mouth daily.  Dispense: 90 tablet; Refill: 1   Will add Buspar 5 mg TID prn  Stress management Work noted given to return to work  RTO as needed    [name], FNP

## 2021-08-23 NOTE — Patient Instructions (Signed)
Panic Attack A panic attack is a sudden episode of severe anxiety, fear, or discomfort that causes physical and emotional symptoms. A panic attack may be in response to something frightening, or it may occur for no known reason. Symptoms of a panic attack can be similar to symptoms of a heart attack or stroke. It is important to see your health care provider when you have a panic attack so that these conditions can be ruled out. What are the causes? A panic attack may be caused by: An extreme, life-threatening situation, such as a war or natural disaster. An anxiety disorder, such as post-traumatic stress disorder. Depression. Panic disorder. Certain medical conditions, including heart problems, neurological conditions, and infections. Other causes may include: Certain over-the-counter and prescription medicines. Supplements that increase anxiety. Illegal drugs that increase heart rate and blood pressure, such as methamphetamine. What increases the risk? You are more likely to develop this condition if: You have another mental health condition. You use alcohol, illegal drugs, or other substances. You are under extreme stress. A life event is causing increased feelings of anxiety and depression. What are the signs or symptoms? A panic attack starts suddenly, usually lasts 5-10 minutes, and occurs with one or more of the following: A pounding heart, or a feeling that your heart is beating irregularly or faster than normal (palpitations). Sweating, trembling, or shaking. Shortness of breath, feeling smothered, or feeling choked. Chest pain or discomfort. Nausea or a strange feeling in your stomach. Dizziness, feeling light-headed, or feeling like you might faint. Other symptoms may include: Chills or hot flashes. Numbness or tingling in your lips, hands, or feet. Feeling confused, or feeling that you are not yourself. Fear of losing control or of being emotionally unstable, or fear of  dying. How is this diagnosed? A panic attack is diagnosed with an assessment by your health care provider. During the assessment, your health care provider will ask questions about: Your history of anxiety, depression, and panic attacks. Your medical history. Whether you drink alcohol, use drugs, take supplements, or take medicines. Be honest about your substance use. Your health care provider may also: Order blood tests or other kinds of tests to rule out serious medical conditions. Refer you to a mental health professional for further evaluation. How is this treated? A panic attack is a symptom of another condition. Treatment depends on the cause of the panic attack. If the cause is a medical problem, your health care provider will treat that problem or refer you to a specialist. If the cause is emotional, you may be given anti-anxiety medicines or referred to a counselor. Anti-anxiety medicines may reduce how often attacks happen, reduce how severe the attacks are, and lower anxiety. If the cause is a medicine, your health care provider may tell you to stop the medicine, change your dose, or take a different medicine. If the cause is an illegal drug, treatment may involve letting the drug wear off and taking medicine to help the drug leave your body or to stop its effects. Attacks caused by heavy drug use may continue even if you stop using the drug. Most panic attacks go away with treatment of the underlying problem. If you have panic attacks often, you may have a condition called panic disorder. Follow these instructions at home: Alcohol use Do not drink alcohol if: Your health care provider tells you not to drink. You are pregnant, may be pregnant, or are planning to become pregnant. If you drink alcohol: Limit how much   you have to: 0-1 drink a day for women. 0-2 drinks a day for men. Know how much alcohol is in your drink. In the [place] one drink equals one 12 oz bottle of beer (355  mL), one 5 oz glass of wine (148 mL), or one 1 oz glass of hard liquor (44 mL). General instructions Take over-the-counter and prescription medicines only as told by your health care provider. If you feel anxious, limit your caffeine intake. Take good care of your physical and mental health by: Eating a balanced diet that includes plenty of fresh fruits and vegetables, whole grains, lean meats, and low-fat dairy. Getting plenty of rest. Try to get 7-8 hours of uninterrupted sleep each night. Exercising regularly. Try to get 30 minutes of physical activity at least 5 days a week. Do not use any products that contain nicotine or tobacco. These products include cigarettes, chewing tobacco, and vaping devices, such as e-cigarettes. If you need help quitting, ask your health care provider. Keep all follow-up visits. This is important. Panic attacks may have underlying physical or emotional problems that take time to accurately diagnose. Where to find more information Substance Abuse and Mental Health Services Administration (SAMHSA): samhsa.gov National Institute of Mental Health (NIMH): www.nimh.nih.gov Contact a health care provider if: Your symptoms do not improve, or they get worse. You are not able to take your medicine as prescribed because of side effects. Get help right away if: You have thoughts about hurting yourself or others. Get help right away if you feel like you may hurt yourself or others, or have thoughts about taking your own life. Go to your nearest emergency room or: Call 911. Call the National Suicide Prevention Lifeline at 1-800-273-8255 or 988. This is open 24 hours a day. Text the Crisis Text Line at 741741. Summary A panic attack is a sudden episode of severe anxiety, fear, or discomfort that causes physical and emotional symptoms. Always see a health care provider to have the reasons for the panic attack correctly diagnosed. If your panic attack was caused by a  physical problem, follow your health care provider's suggestions for medicine, referral to a specialist, and lifestyle changes. If your panic attack was caused by an emotional problem, follow through with counseling from a qualified mental health specialist. If you feel like you may hurt yourself or others, call 911 and get help right away. This information is not intended to replace advice given to you by your health care provider. Make sure you discuss any questions you have with your health care provider. Document Revised: 03/24/2021 Document Reviewed: 03/24/2021 Elsevier Patient Education  2022 Elsevier Inc.  

## 2021-09-01 ENCOUNTER — Ambulatory Visit (INDEPENDENT_AMBULATORY_CARE_PROVIDER_SITE_OTHER): Payer: 59 | Admitting: Internal Medicine

## 2021-09-01 ENCOUNTER — Other Ambulatory Visit: Payer: Self-pay

## 2021-09-01 ENCOUNTER — Encounter: Payer: Self-pay | Admitting: Internal Medicine

## 2021-09-01 VITALS — BP 116/76 | HR 75 | Ht 66.0 in | Wt 116.8 lb

## 2021-09-01 DIAGNOSIS — I471 Supraventricular tachycardia: Secondary | ICD-10-CM

## 2021-09-01 MED ORDER — METOPROLOL SUCCINATE ER 25 MG PO TB24: 25.0000 mg | ORAL_TABLET | Freq: Every day | ORAL | 3 refills | Status: DC

## 2021-09-01 NOTE — Patient Instructions (Signed)
Medication Instructions:   Toprol XL 25 mg Daily   *If you need a refill on your cardiac medications before your next appointment, please call your pharmacy*   Lab Work: NONE   If you have labs (blood work) drawn today and your tests are completely normal, you will receive your results only by: MyChart Message (if you have MyChart) OR A paper copy in the mail If you have any lab test that is abnormal or we need to change your treatment, we will call you to review the results.   Testing/Procedures: NONE    Follow-Up: At [facility], you and your health needs are our priority.  As part of our continuing mission to provide you with exceptional heart care, we have created designated Provider Care Teams.  These Care Teams include your primary Cardiologist (physician) and Advanced Practice Providers (APPs -  Physician Assistants and Nurse Practitioners) who all work together to provide you with the care you need, when you need it.  We recommend signing up for the patient portal called "MyChart".  Sign up information is provided on this After Visit Summary.  MyChart is used to connect with patients for Virtual Visits (Telemedicine).  Patients are able to view lab/test results, encounter notes, upcoming appointments, etc.  Non-urgent messages can be sent to your provider as well.   To learn more about what you can do with MyChart, go to [email or web address].    Your next appointment:   3 month(s)  The format for your next appointment:   In Person  Provider:   Lewayne Bunting, MD    Other Instructions Thank you for choosing [facility]!

## 2021-09-01 NOTE — Progress Notes (Signed)
HPI Ms. Brandy Vega is referred by Dr. Antoine Poche for evaluation of SVT. She is a pleasant 46 yo woman with a h/o anxiety. She has a h/o palpitations and wore a cardiac monitor which demonstrated recurrent short RP tachycardia. She has never had syncope.   No Known Allergies   Current Outpatient Medications  Medication Sig Dispense Refill   albuterol (VENTOLIN HFA) 108 (90 Base) MCG/ACT inhaler Inhale 2 puffs into the lungs every 6 (six) hours as needed for wheezing or shortness of breath. 8 g 0   busPIRone (BUSPAR) 5 MG tablet Take 1 tablet (5 mg total) by mouth 3 (three) times daily as needed. 90 tablet 1   cetirizine (ZYRTEC ALLERGY) 10 MG tablet Take 1 tablet (10 mg total) by mouth daily. 90 tablet 1   escitalopram (LEXAPRO) 20 MG tablet Take 1 tablet (20 mg total) by mouth daily. 90 tablet 1   fluticasone (FLONASE) 50 MCG/ACT nasal spray Place 2 sprays into both nostrils daily. 16 g 6   hydrOXYzine (ATARAX/VISTARIL) 25 MG tablet Take one to two po q six hours prn anxiety     ibuprofen (ADVIL) 800 MG tablet TAKE 1 TABLET BY MOUTH EVERY 8 HOURS AS NEEDED 90 tablet 0   Melatonin 10 MG TABS Take 10 mg by mouth daily as needed. 90 tablet 1   No current facility-administered medications for this visit.     Past Medical History:  Diagnosis Date   Bipolar 1 disorder (HCC)    Essential hypertension 09/05/2018   GAD (generalized anxiety disorder) 06/25/2019   Hypertriglyceridemia 07/25/2021   MDD (major depressive disorder), recurrent episode, severe (HCC) 03/18/2018    ROS:   All systems reviewed and negative except as noted in the HPI.   Past Surgical History:  Procedure Laterality Date   DILATION AND CURETTAGE OF UTERUS       Family History  Problem Relation Age of Onset   Hypertension Mother    Diabetes Father      Social History   Socioeconomic History   Marital status: Single    Spouse name: Not on file   Number of children: Not on file   Years of education: Not  on file   Highest education level: Not on file  Occupational History   Not on file  Tobacco Use   Smoking status: Every Day    Packs/day: 0.50    Types: Cigarettes   Smokeless tobacco: Never  Vaping Use   Vaping Use: Never used  Substance and Sexual Activity   Alcohol use: Yes    Comment: occ. use   Drug use: No   Sexual activity: Yes    Birth control/protection: None  Other Topics Concern   Not on file  Social History Narrative   Lives with boyfriend.     Social Determinants of Health   Financial Resource Strain: Not on file  Food Insecurity: Not on file  Transportation Needs: Not on file  Physical Activity: Not on file  Stress: Not on file  Social Connections: Not on file  Intimate Partner Violence: Not on file     BP 116/76    Pulse 75    Ht 5\' 6"  (1.676 m)    Wt 116 lb 12.8 oz (53 kg)    SpO2 98%    BMI 18.85 kg/m   Physical Exam:  Well appearing NAD HEENT: Unremarkable Neck:  No JVD, no thyromegally Lymphatics:  No adenopathy Back:  No CVA tenderness Lungs:  clear with no wheezes HEART:  Regular rate rhythm, no murmurs, no rubs, no clicks Abd:  soft, positive bowel sounds, no organomegally, no rebound, no guarding Ext:  2 plus pulses, no edema, no cyanosis, no clubbing Skin:  No rashes no nodules Neuro:  CN II through XII intact, motor grossly intact  EKG - reviewed. NSR with no pre-excitation  Assess/Plan:  SVT - I discussed the treatment options with the patient and recommed initially trying toprol. She will start with 25 mg daily.  Anxiety - she is likely anxious because of SVT.   Brandy Gowda Finneas Mathe,MD

## 2021-09-06 ENCOUNTER — Telehealth: Payer: Self-pay | Admitting: Internal Medicine

## 2021-09-06 ENCOUNTER — Other Ambulatory Visit: Payer: Self-pay | Admitting: Family Medicine

## 2021-09-06 DIAGNOSIS — M542 Cervicalgia: Secondary | ICD-10-CM

## 2021-09-06 NOTE — Telephone Encounter (Signed)
Pt c/o medication issue:  1. Name of Medication:  metoprolol succinate (TOPROL XL) 25 MG 24 hr tablet  2. How are you currently taking this medication (dosage and times per day)? Stopped taking as of yesterday  3. Are you having a reaction (difficulty breathing--STAT)? Yes  4. What is your medication issue? Brandy Vega is calling stating this medication caused her nausea, lightheadedness, and sleepiness to the point she could barley stay up at work. She stopped taking it and is now feeling a lot better. Please advise.

## 2021-09-07 NOTE — Telephone Encounter (Signed)
Spoke with pt who states that Toprol XL is making her tired and causing nausea. Pt will try to change the time of day that she is taking the Toprol XL. She will try to take before she lays down in the morning to see if that will help.

## 2021-09-09 ENCOUNTER — Telehealth: Payer: Self-pay | Admitting: Family

## 2021-09-09 ENCOUNTER — Telehealth: Payer: Self-pay | Admitting: Cardiology

## 2021-09-09 NOTE — Telephone Encounter (Signed)
Pt wants Christy's opinion on her taking metoprolol succinate (TOPROL XL) 25 MG 24 hr tablet that was prescribed by her cardiologist. She said that it was making her blood pressure go up. Please call back.

## 2021-09-09 NOTE — Telephone Encounter (Signed)
Pt c/o medication issue:  1. Name of Medication:   metoprolol succinate (TOPROL XL) 25 MG 24 hr tablet    2. How are you currently taking this medication (dosage and times per day)? Take 1 tablet (25 mg total) by mouth daily.  3. Are you having a reaction (difficulty breathing--STAT)? no  4. What is your medication issue? Pt states that she was advised to take this medication at a different time to assist with symptoms and she Is still having the same issue... please advise

## 2021-09-09 NOTE — Telephone Encounter (Signed)
Spoke with patient of Dr. Antoine Poche, Dr. Ladona Ridgel (on vacation per qgenda)  She was prescribed metoprolol succinate 25mg  by MD on 09/01/21 -- SVT  She did NOT take metoprolol succinate yesterday or this morning  She reports med has been making her feel unwell - nausea, lightheaded, sleep (see 1/10 note)  She works night - was told to take meto succ in the AM, but she has since stopped med x2 days  BP readings: 176/98 164/100 165/110 168/102  She had to call EMS at work and BP was 165/110 and HR 56  Vitals today 138/96 and HR 79   She would like to know what to do with meto succ prescription, given elevated BP since starting it and side effects  Routed to MD(s)

## 2021-09-09 NOTE — Telephone Encounter (Signed)
Called pt to discuss this - NA and no VM   There is already a note into cardio this AM to discuss this as well - I would prefer for pt to take their response anyway, dur to them ordering this.

## 2021-09-09 NOTE — Telephone Encounter (Signed)
If she works nights, I would have her take it when she gets home from work and after she eats  before going to bed. Also catheter ablation an option. GT

## 2021-09-09 NOTE — Telephone Encounter (Signed)
I personally take toprol xl in the evenings after dinner. Consider taking then. GT

## 2021-09-12 ENCOUNTER — Other Ambulatory Visit: Payer: Self-pay

## 2021-09-12 NOTE — Telephone Encounter (Signed)
Apt with Dr.Taylor made for 09/27/21 at 2:45 pm in [place]

## 2021-09-12 NOTE — Telephone Encounter (Signed)
Per message from Chauncy Lean apt made to see Dr.taylor .

## 2021-09-12 NOTE — Telephone Encounter (Signed)
[place] triage team attempted to call patient and left a message.   MyChart message sent with recommendations

## 2021-09-12 NOTE — Telephone Encounter (Signed)
Attempt to reach patient, no answer,no machine at home number.No voicemail set up for cell phone.

## 2021-09-12 NOTE — Addendum Note (Signed)
Addended by: Blossom Crume A on: 07/03/2022 04:38 PM   Modules accepted: Orders  

## 2021-09-12 NOTE — Telephone Encounter (Signed)
Rollene Rotunda, MD to Me   09/09/2021   5:55 PM The patient can stay off her metoprolol because she felt poorly taking this.  Her blood pressure is normal she reports.  She will follow-up with Dr. Ladona Ridgel to further discuss possibility of ablation.

## 2021-09-27 ENCOUNTER — Ambulatory Visit: Payer: 59 | Admitting: Internal Medicine

## 2021-10-12 ENCOUNTER — Ambulatory Visit: Payer: 59 | Admitting: Internal Medicine

## 2021-11-08 ENCOUNTER — Encounter: Payer: Self-pay | Admitting: Internal Medicine

## 2021-11-08 ENCOUNTER — Ambulatory Visit (INDEPENDENT_AMBULATORY_CARE_PROVIDER_SITE_OTHER): Payer: 59 | Admitting: Internal Medicine

## 2021-11-08 VITALS — BP 110/78 | HR 80 | Ht 66.0 in | Wt 119.0 lb

## 2021-11-08 DIAGNOSIS — I471 Supraventricular tachycardia: Secondary | ICD-10-CM | POA: Diagnosis not present

## 2021-11-08 NOTE — Patient Instructions (Signed)
Medication Instructions:  Your physician recommends that you continue on your current medications as directed. Please refer to the Current Medication list given to you today.  *If you need a refill on your cardiac medications before your next appointment, please call your pharmacy*   Lab Work: NONE   If you have labs (blood work) drawn today and your tests are completely normal, you will receive your results only by: . MyChart Message (if you have MyChart) OR . A paper copy in the mail If you have any lab test that is abnormal or we need to change your treatment, we will call you to review the results.   Testing/Procedures: NONE    Follow-Up: At [facility], you and your health needs are our priority.  As part of our continuing mission to provide you with exceptional heart care, we have created designated Provider Care Teams.  These Care Teams include your primary Cardiologist (physician) and Advanced Practice Providers (APPs -  Physician Assistants and Nurse Practitioners) who all work together to provide you with the care you need, when you need it.  We recommend signing up for the patient portal called "MyChart".  Sign up information is provided on this After Visit Summary.  MyChart is used to connect with patients for Virtual Visits (Telemedicine).  Patients are able to view lab/test results, encounter notes, upcoming appointments, etc.  Non-urgent messages can be sent to your provider as well.   To learn more about what you can do with MyChart, go to [email or web address].    Your next appointment:   1 year(s)  The format for your next appointment:   In Person  Provider:   Gregg Taylor, MD   Other Instructions Thank you for choosing [facility]!    

## 2021-11-08 NOTE — Progress Notes (Signed)
? ? ? ? ?HPI ?Brandy Vega returns today for followup. She was placed on a beta blocker for recurrent SVT but developed HTN on the beta blocker. She notes that since I saw her last her SVT has been fairly well controlled. She practices deep breathing when she goes out of rhythm. She denies chest pain or sob.  ? ?No Known Allergies ? ? ?Current Outpatient Medications  ?Medication Sig Dispense Refill  ? albuterol (VENTOLIN HFA) 108 (90 Base) MCG/ACT inhaler Inhale 2 puffs into the lungs every 6 (six) hours as needed for wheezing or shortness of breath. 8 g 0  ? busPIRone (BUSPAR) 5 MG tablet Take 1 tablet (5 mg total) by mouth 3 (three) times daily as needed. 90 tablet 1  ? cetirizine (ZYRTEC ALLERGY) 10 MG tablet Take 1 tablet (10 mg total) by mouth daily. 90 tablet 1  ? escitalopram (LEXAPRO) 20 MG tablet Take 1 tablet (20 mg total) by mouth daily. 90 tablet 1  ? fluticasone (FLONASE) 50 MCG/ACT nasal spray Place 2 sprays into both nostrils daily. 16 g 6  ? hydrOXYzine (ATARAX/VISTARIL) 25 MG tablet Take one to two po q six hours prn anxiety    ? ibuprofen (ADVIL) 800 MG tablet TAKE 1 TABLET BY MOUTH EVERY 8 HOURS AS NEEDED 90 tablet 1  ? Melatonin 10 MG TABS Take 10 mg by mouth daily as needed. 90 tablet 1  ? metoprolol succinate (TOPROL XL) 25 MG 24 hr tablet Take 1 tablet (25 mg total) by mouth daily. (Patient not taking: Reported on 11/08/2021) 90 tablet 3  ? ?No current facility-administered medications for this visit.  ? ? ? ?Past Medical History:  ?Diagnosis Date  ? Bipolar 1 disorder ([place])   ? Essential hypertension 09/05/2018  ? GAD (generalized anxiety disorder) 06/25/2019  ? Hypertriglyceridemia 07/25/2021  ? MDD (major depressive disorder), recurrent episode, severe ([place]) 03/18/2018  ? ? ?ROS: ? ? All systems reviewed and negative except as noted in the HPI. ? ? ?Past Surgical History:  ?Procedure Laterality Date  ? DILATION AND CURETTAGE OF UTERUS    ? ? ? ?Family History  ?Problem Relation Age of Onset  ?  Hypertension Mother   ? Diabetes Father   ? ? ? ?Social History  ? ?Socioeconomic History  ? Marital status: Single  ?  Spouse name: Not on file  ? Number of children: Not on file  ? Years of education: Not on file  ? Highest education level: Not on file  ?Occupational History  ? Not on file  ?Tobacco Use  ? Smoking status: Every Day  ?  Packs/day: 0.50  ?  Types: Cigarettes  ? Smokeless tobacco: Never  ?Vaping Use  ? Vaping Use: Never used  ?Substance and Sexual Activity  ? Alcohol use: Yes  ?  Comment: occ. use  ? Drug use: No  ? Sexual activity: Yes  ?  Birth control/protection: None  ?Other Topics Concern  ? Not on file  ?Social History Narrative  ? Lives with boyfriend.    ? ?Social Determinants of Health  ? ?Financial Resource Strain: Not on file  ?Food Insecurity: Not on file  ?Transportation Needs: Not on file  ?Physical Activity: Not on file  ?Stress: Not on file  ?Social Connections: Not on file  ?Intimate Partner Violence: Not on file  ? ? ? ?BP 110/78   Pulse 80   Ht 5\' 6"  (1.676 m)   Wt 119 lb (54 kg)   SpO2 100%  BMI 19.21 kg/m?  ? ?Physical Exam: ? ?Well appearing NAD ?HEENT: Unremarkable ?Neck:  No JVD, no thyromegally ?Lymphatics:  No adenopathy ?Back:  No CVA tenderness ?Lungs:  Clear with no wheezes ?HEART:  Regular rate rhythm, no murmurs, no rubs, no clicks ?Abd:  soft, positive bowel sounds, no organomegally, no rebound, no guarding ?Ext:  2 plus pulses, no edema, no cyanosis, no clubbing ?Skin:  No rashes no nodules ?Neuro:  CN II through XII intact, motor grossly intact ? ?Assess/Plan:  ?SVT - her symptoms are fairly well controlled. I encouraged her to avoid caffeine and ETOH and to get sufficient sleep. She will call us if her symptoms worsen and she wishes to consider catheter ablation.  ?HTN - her bp is better today. Unclear why she could not tolerated the beta blocker. If it goes back up we could consider a calcium channel blocker ?Anxiety - this is contributing. I encouraged  strategies for relaxation. ?Tobacco abuse - she is encouraged to stop smoking. ? ?Carleene Overlie Halea Lieb,MD ?

## 2021-11-15 ENCOUNTER — Encounter: Payer: Self-pay | Admitting: Family

## 2021-11-15 ENCOUNTER — Ambulatory Visit (INDEPENDENT_AMBULATORY_CARE_PROVIDER_SITE_OTHER): Payer: 59 | Admitting: Family

## 2021-11-15 VITALS — BP 129/85 | HR 92 | Temp 97.0°F | Ht 66.0 in | Wt 118.6 lb

## 2021-11-15 DIAGNOSIS — F1020 Alcohol dependence, uncomplicated: Secondary | ICD-10-CM

## 2021-11-15 DIAGNOSIS — F410 Panic disorder [episodic paroxysmal anxiety] without agoraphobia: Secondary | ICD-10-CM

## 2021-11-15 DIAGNOSIS — F332 Major depressive disorder, recurrent severe without psychotic features: Secondary | ICD-10-CM

## 2021-11-15 DIAGNOSIS — F411 Generalized anxiety disorder: Secondary | ICD-10-CM | POA: Diagnosis not present

## 2021-11-15 DIAGNOSIS — I471 Supraventricular tachycardia: Secondary | ICD-10-CM

## 2021-11-15 DIAGNOSIS — F17200 Nicotine dependence, unspecified, uncomplicated: Secondary | ICD-10-CM

## 2021-11-15 MED ORDER — BUSPIRONE HCL 7.5 MG PO TABS: 7.5000 mg | ORAL_TABLET | Freq: Three times a day (TID) | ORAL | 1 refills | Status: DC | PRN

## 2021-11-15 NOTE — Patient Instructions (Signed)
Supraventricular Tachycardia, Adult ?Supraventricular tachycardia (SVT) is a type of abnormal heart rhythm. It causes the heart to beat very quickly. SVT can start suddenly and last for a short time, which is called paroxysmal SVT, or it may last longer and require specialized treatment to return the heart rhythm to normal. ?A normal resting heart rate is 60-100 beats per minute. During an episode of SVT, your heart rate may be higher than 150 beats per minute. Episodes of SVT can be frightening, but they are usually not dangerous. However, if episodes happen several times a day or last longer than a few seconds, they may lead to heart failure. ?What are the causes? ?Usually, a normal heartbeat starts when an area called the sinoatrial node releases an electrical signal. In SVT, other areas of the heart send out electrical signals that interfere with the signal from the sinoatrial node. The cause of this abnormal electrical activity is not known. ?What increases the risk? ?You are more likely to develop this condition if you are: ?Middle aged or younger. ?Female. ?The following factors may also make you more likely to develop this condition: ?Stress or anxiety. ?Tiredness. ?Smoking. ?Stimulant drugs, such as cocaine and methamphetamine. ?Alcohol. ?Caffeine. ?Pregnancy. ?Having any of these conditions: ?A thyroid condition. ?Diabetes mellitus. ?Obstructive sleep apnea. ?What are the signs or symptoms? ?Symptoms of this condition include: ?A pounding heart. ?A feeling that the heart is skipping beats (palpitations). ?Weakness. ?Shortness of breath. ?Tightness or pain in your chest. ?Light-headedness or dizziness. ?Anxiety. ?Sweating. ?Nausea. ?Fainting. ?Fatigue or tiredness. ?A mild episode may not cause symptoms. ?How is this diagnosed? ?This condition may be diagnosed based on: ?Your symptoms. ?A physical exam. If you have an episode of SVT during the exam, the health care provider may be able to diagnose SVT by  listening to your heart and feeling your pulse. ?Tests. These may include: ?An electrocardiogram (ECG). This test is done to check for problems with electrical activity in the heart. ?A Holter monitor or event monitor test. This test involves wearing a portable device that monitors your heart rate over time. ?An echocardiogram. This test involves taking an image of your heart using sound waves. It is done to rule out other causes of a fast heart rate. ?A stress echocardiogram. This test involves doing an echocardiogram when you are at rest and after exercise. ?Blood tests. ?An electrophysiology study (EPS). This tests the electrical activity in your heart to find where the abnormal heart rhythm is coming from using cardiac catheters. ?How is this treated? ?This condition may be treated with: ?Vagal nerve stimulation. This involves stimulating your vagus nerve, which is a nerve that runs from the chest, through the neck, to the lower part of the brain. Stimulating this nerve can slow down the heart. It is often the first and only treatment that is needed for this condition. Work with your health care provider to find which technique works best for you. Ways to do this treatment include: ?Laying on your back, then holding your breath and pushing, as though you are having a bowel movement. ?Massaging an area on one side of your neck, below your jaw. Do not try this yourself. Only a health care provider should do this. If done the wrong way, it can lead to a stroke. ?Bending forward with your head between your legs. ?Coughing while bending forward with your head between your legs. ?Applying an ice-cold, wet towel to your face. ?Medicines that prevent attacks. ?Medicine to stop an attack.  The medicine is given through an IV at the hospital. ?A small electric shock (cardioversion) that stops an attack. Before you get the shock, you will get medicine to make you fall asleep. ?Radiofrequency ablation. In this procedure, a  small, thin tube (catheter) is used to send radiofrequency energy to the area of tissue that is causing the rapid heartbeats. The energy kills the cells and helps your heart keep a normal rhythm. You may have this treatment if you have symptoms of SVT often. ?If you do not have symptoms, you may not need treatment. ?Follow these instructions at home: ?Stress ?Avoid stressful situations when possible. ?Find healthy ways of managing stress, such as: ?Taking part in relaxing activities, such as yoga, meditation, or being out in nature. ?Listening to relaxing music. ?Practicing relaxation techniques, such as deep breathing. ?Leading a healthy lifestyle. This involves getting plenty of sleep, exercising, and eating a balanced diet. ?Attending counseling or talk therapy with a mental health professional. ?Lifestyle ? ?Try to get at least 7 hours of sleep each night. ?Do not use any products that contain nicotine or tobacco. These products include cigarettes, chewing tobacco, and vaping devices, such as e-cigarettes. If you need help quitting, ask your health care provider. ?Do not drink alcohol if it triggers episodes of SVT. ?If alcohol does not seem to trigger episodes, limit your alcohol intake. If you drink alcohol: ?Limit how much you have to: ?0-1 drink a day for women who are not pregnant. ?0-2 drinks a day for men. ?Know how much alcohol is in your drink. In the [place] one drink equals one 12 oz bottle of beer (355 mL), one 5 oz glass of wine (148 mL), or one 1? oz glass of hard liquor (44 mL). ?Be aware of how caffeine affects your condition. If caffeine: ?Triggers episodes of SVT, do not eat, drink, or use anything with caffeine in it. ?Does not seem to trigger episodes, consume caffeine in moderation. ?Do not use stimulant drugs. If you need help quitting, talk with your health care provider. ?General instructions ?Maintain a healthy weight. ?Exercise regularly. Ask your health care provider to suggest some good  activities for you. Aim for one or a combination of the following: ?150 minutes per week of moderate exercise, such as walking or yoga. ?75 minutes per week of vigorous exercise, such as running or swimming. ?Perform vagus nerve stimulation as directed by your health care provider. ?Take over-the-counter and prescription medicines only as told by your health care provider. ?Keep all follow-up visits. This is important. ?Contact a health care provider if: ?You have episodes of SVT more often than before. ?Episodes of SVT last longer than before. ?Vagus nerve stimulation is no longer helping. ?You have new symptoms. ?Get help right away if: ?You have chest pain. ?Your symptoms get worse. ?You have trouble breathing. ?You have an episode of SVT that lasts longer than 20 minutes. ?You faint. ?These symptoms may represent a serious problem that is an emergency. Do not wait to see if the symptoms will go away. Get medical help right away. Call your local emergency services (911 in the [place] Do not drive yourself to the hospital. ?Summary ?Supraventricular tachycardia (SVT) is a type of abnormal heart rhythm. ?During an episode of SVT, your heart rate may be higher than 150 beats per minute. ?If you do not have symptoms, you may not need treatment. ?This information is not intended to replace advice given to you by your health care provider. Make sure you  discuss any questions you have with your health care provider. ?Document Revised: 03/27/2020 Document Reviewed: 03/27/2020 ?Elsevier Patient Education ? [employer]. ? ?

## 2021-11-15 NOTE — Progress Notes (Signed)
? ?Subjective:  ? ? Patient ID: Brandy Vega, female    DOB: 03-Apr-1976, 46 y.o.   MRN: [id number] ? ?Chief Complaint  ?Patient presents with  ? Depression  ? ?PT presents to the office today for depression and GAD. States she had a panic attack at work. She was sent home.  ? ?She states her anxiety has worsen because the decrease health and Cardiologists recommended a catheter ablation. This has made her very anxious.  ? ?She has SVT and her symptoms are stable at this time. Could not tolerate beta blocker.  ? ?Reports she is drinking 2 12 oz beers a week. ?Depression ?       This is a chronic problem.  The current episode started more than 1 year ago.   The onset quality is gradual.   The problem occurs intermittently.  Associated symptoms include decreased concentration, fatigue, helplessness, hopelessness, irritable, restlessness, decreased interest, appetite change and sad.     The symptoms are aggravated by family issues.  Past treatments include SSRIs - Selective serotonin reuptake inhibitors.  Compliance with treatment is good.  Past medical history includes anxiety.   ?Anxiety ?Presents for follow-up visit. Symptoms include decreased concentration, excessive worry, irritability, nervous/anxious behavior and restlessness. Symptoms occur most days. The severity of symptoms is moderate.  ? ? ?Nicotine Dependence ?Presents for follow-up visit. Symptoms include decreased concentration, fatigue and irritability. Her urge triggers include company of smokers. The symptoms have been stable. She smokes < 1/2 a pack of cigarettes per day.  ? ? ? ?Review of Systems  ?Constitutional:  Positive for appetite change, fatigue and irritability.  ?Psychiatric/Behavioral:  Positive for decreased concentration and depression. The patient is nervous/anxious.   ?All other systems reviewed and are negative. ? ?   ?Objective:  ? Physical Exam ?Vitals reviewed.  ?Constitutional:   ?   General: She is irritable. She is not in acute  distress. ?   Appearance: She is well-developed.  ?HENT:  ?   Head: Normocephalic and atraumatic.  ?   Right Ear: External ear normal.  ?Eyes:  ?   Pupils: Pupils are equal, round, and reactive to light.  ?Neck:  ?   Thyroid: No thyromegaly.  ?Cardiovascular:  ?   Rate and Rhythm: Normal rate and regular rhythm.  ?   Heart sounds: Normal heart sounds. No murmur heard. ?Pulmonary:  ?   Effort: Pulmonary effort is normal. No respiratory distress.  ?   Breath sounds: Normal breath sounds. No wheezing.  ?Abdominal:  ?   General: Bowel sounds are normal. There is no distension.  ?   Palpations: Abdomen is soft.  ?   Tenderness: There is no abdominal tenderness.  ?Musculoskeletal:     ?   General: No tenderness. Normal range of motion.  ?   Cervical back: Normal range of motion and neck supple.  ?Skin: ?   General: Skin is warm and dry.  ?Neurological:  ?   Mental Status: She is alert and oriented to person, place, and time.  ?   Cranial Nerves: No cranial nerve deficit.  ?   Deep Tendon Reflexes: Reflexes are normal and symmetric.  ?Psychiatric:     ?   Mood and Affect: Mood is anxious. Affect is tearful.     ?   Behavior: Behavior normal.     ?   Thought Content: Thought content normal.     ?   Judgment: Judgment normal.  ? ? ? ? ?BP 129/85  Pulse 92   Temp (!) 97 ?F (36.1 ?C) (Temporal)   Ht 5\' 6"  (1.676 m)   Wt 118 lb 9.6 oz (53.8 kg)   BMI 19.14 kg/m?  ? ?   ?Assessment & Plan:  ?Brittay Mogle comes in today with chief complaint of Depression ? ? ?Diagnosis and orders addressed: ? ?1. Current smoker ? ?2. GAD (generalized anxiety disorder) ?Will increase Buspar to 7.5 mg TID from 5 mg BID ?Stress management  ?- busPIRone (BUSPAR) 7.5 MG tablet; Take 1 tablet (7.5 mg total) by mouth 3 (three) times daily as needed.  Dispense: 270 each; Refill: 1 ? ?3. Severe episode of recurrent major depressive disorder, without psychotic features (HCC) ? ?4. Alcohol use disorder, severe, dependence (HCC) ? ?5. SVT  (supraventricular tachycardia) (HCC) ? ? ?6. Panic attack ?- busPIRone (BUSPAR) 7.5 MG tablet; Take 1 tablet (7.5 mg total) by mouth 3 (three) times daily as needed.  Dispense: 270 each; Refill: 1 ? ? ?Work note given ?Health Maintenance reviewed ?Diet and exercise encouraged ? ?Follow up plan: ?3 months  ? ? ?Carrie Mew, FNP ? ? ? ?

## 2021-11-29 ENCOUNTER — Ambulatory Visit: Payer: 59 | Admitting: Internal Medicine

## 2022-02-07 ENCOUNTER — Encounter: Payer: Self-pay | Admitting: Family

## 2022-02-07 ENCOUNTER — Ambulatory Visit (INDEPENDENT_AMBULATORY_CARE_PROVIDER_SITE_OTHER): Payer: 59 | Admitting: Family

## 2022-02-07 VITALS — BP 146/93 | HR 87 | Temp 98.5°F | Ht 66.0 in | Wt 115.4 lb

## 2022-02-07 DIAGNOSIS — F17200 Nicotine dependence, unspecified, uncomplicated: Secondary | ICD-10-CM

## 2022-02-07 DIAGNOSIS — F1020 Alcohol dependence, uncomplicated: Secondary | ICD-10-CM

## 2022-02-07 DIAGNOSIS — I10 Essential (primary) hypertension: Secondary | ICD-10-CM | POA: Diagnosis not present

## 2022-02-07 DIAGNOSIS — I471 Supraventricular tachycardia: Secondary | ICD-10-CM

## 2022-02-07 DIAGNOSIS — F5104 Psychophysiologic insomnia: Secondary | ICD-10-CM

## 2022-02-07 DIAGNOSIS — F339 Major depressive disorder, recurrent, unspecified: Secondary | ICD-10-CM | POA: Diagnosis not present

## 2022-02-07 DIAGNOSIS — F411 Generalized anxiety disorder: Secondary | ICD-10-CM | POA: Diagnosis not present

## 2022-02-07 DIAGNOSIS — F332 Major depressive disorder, recurrent severe without psychotic features: Secondary | ICD-10-CM

## 2022-02-07 DIAGNOSIS — R0602 Shortness of breath: Secondary | ICD-10-CM

## 2022-02-07 DIAGNOSIS — R5383 Other fatigue: Secondary | ICD-10-CM

## 2022-02-07 MED ORDER — ALBUTEROL SULFATE HFA 108 (90 BASE) MCG/ACT IN AERS: 2.0000 | INHALATION_SPRAY | Freq: Four times a day (QID) | RESPIRATORY_TRACT | 1 refills | Status: DC | PRN

## 2022-02-07 MED ORDER — MELATONIN 10 MG PO TABS: 10.0000 mg | ORAL_TABLET | Freq: Every day | ORAL | 1 refills | Status: AC | PRN

## 2022-02-07 MED ORDER — CETIRIZINE HCL 10 MG PO TABS: 10.0000 mg | ORAL_TABLET | Freq: Every day | ORAL | 1 refills | Status: DC

## 2022-02-07 MED ORDER — ESCITALOPRAM OXALATE 20 MG PO TABS: 20.0000 mg | ORAL_TABLET | Freq: Every day | ORAL | 1 refills | Status: DC

## 2022-02-07 NOTE — Progress Notes (Signed)
Subjective:    Patient ID: Brandy Vega, female    DOB: 1976-08-24, 46 y.o.   MRN: [id number]  Chief Complaint  Patient presents with   Medical Management of Chronic Issues   Pt presents to the office today for chronic follow up.    She states her anxiety has worsen because the decrease health and Cardiologists recommended a catheter ablation. This has made her very anxious. States she has been moving and causing increase anxiety.    She has SVT and her symptoms are stable at this time. Could not tolerate beta blocker.    Reports she is drinking 2 12 oz beers a week. Hypertension This is a chronic problem. The current episode started more than 1 year ago. The problem has been waxing and waning since onset. The problem is uncontrolled. Associated symptoms include anxiety, malaise/fatigue and palpitations. Pertinent negatives include no peripheral edema or shortness of breath. Risk factors for coronary artery disease include dyslipidemia and sedentary lifestyle. The current treatment provides mild improvement.  Depression        This is a chronic problem.  The current episode started more than 1 year ago.   The onset quality is gradual.   The problem has been waxing and waning since onset.  Associated symptoms include fatigue, insomnia, restlessness, decreased interest and sad.  Associated symptoms include no helplessness and no hopelessness.  Past treatments include SSRIs - Selective serotonin reuptake inhibitors.  Past medical history includes anxiety.   Anxiety Presents for follow-up visit. Symptoms include depressed mood, excessive worry, hyperventilation, insomnia, irritability, nervous/anxious behavior, palpitations, panic and restlessness. Patient reports no shortness of breath.    Insomnia Primary symptoms: difficulty falling asleep, frequent awakening, malaise/fatigue.   PMH includes: depression.   Nicotine Dependence Presents for follow-up visit. Symptoms include fatigue,  insomnia and irritability. Her urge triggers include company of smokers. The symptoms have been stable. She smokes < 1/2 a pack of cigarettes per day.      Review of Systems  Constitutional:  Positive for fatigue, irritability and malaise/fatigue.  Respiratory:  Negative for shortness of breath.   Cardiovascular:  Positive for palpitations.  Psychiatric/Behavioral:  Positive for depression. The patient is nervous/anxious and has insomnia.   All other systems reviewed and are negative.      Objective:   Physical Exam Vitals reviewed.  Constitutional:      General: She is not in acute distress.    Appearance: She is well-developed.  HENT:     Head: Normocephalic and atraumatic.     Right Ear: Tympanic membrane normal.     Left Ear: Tympanic membrane normal.  Eyes:     Pupils: Pupils are equal, round, and reactive to light.  Neck:     Thyroid: No thyromegaly.  Cardiovascular:     Rate and Rhythm: Normal rate and regular rhythm.     Heart sounds: Normal heart sounds. No murmur heard. Pulmonary:     Effort: Pulmonary effort is normal. No respiratory distress.     Breath sounds: Normal breath sounds. No wheezing.  Abdominal:     General: Bowel sounds are normal. There is no distension.     Palpations: Abdomen is soft.     Tenderness: There is no abdominal tenderness.  Musculoskeletal:        General: No tenderness. Normal range of motion.     Cervical back: Normal range of motion and neck supple.  Skin:    General: Skin is warm and dry.  Neurological:  Mental Status: She is alert and oriented to person, place, and time.     Cranial Nerves: No cranial nerve deficit.     Deep Tendon Reflexes: Reflexes are normal and symmetric.  Psychiatric:        Behavior: Behavior normal.        Thought Content: Thought content normal.        Judgment: Judgment normal.       BP (!) 146/93   Pulse 87   Temp 98.5 F (36.9 C) (Temporal)   Ht 5' 6"  (1.676 m)   Wt 115 lb 6.4 oz  (52.3 kg)   SpO2 100%   BMI 18.63 kg/m      Assessment & Plan:  Brandy Vega comes in today with chief complaint of Medical Management of Chronic Issues   Diagnosis and orders addressed:  1. Depression, recurrent ([place]) - escitalopram (LEXAPRO) 20 MG tablet; Take 1 tablet (20 mg total) by mouth daily.  Dispense: 90 tablet; Refill: 1 - Melatonin 10 MG TABS; Take 10 mg by mouth daily as needed.  Dispense: 90 tablet; Refill: 1 - CMP14+EGFR  2. GAD (generalized anxiety disorder) - escitalopram (LEXAPRO) 20 MG tablet; Take 1 tablet (20 mg total) by mouth daily.  Dispense: 90 tablet; Refill: 1 - CMP14+EGFR  3. Severe episode of recurrent major depressive disorder, without psychotic features ([place]) - escitalopram (LEXAPRO) 20 MG tablet; Take 1 tablet (20 mg total) by mouth daily.  Dispense: 90 tablet; Refill: 1 - CMP14+EGFR  4. SOB (shortness of breath) - albuterol (VENTOLIN HFA) 108 (90 Base) MCG/ACT inhaler; Inhale 2 puffs into the lungs every 6 (six) hours as needed for wheezing or shortness of breath.  Dispense: 8 g; Refill: 1 - CMP14+EGFR  5. Essential hypertension - CMP14+EGFR  6. SVT (supraventricular tachycardia) (HCC) - CMP14+EGFR  7. Current smoker - CMP14+EGFR  8. Chronic insomnia - CMP14+EGFR  9. Alcohol use disorder, severe, dependence ([place]) - CMP14+EGFR  10. Other fatigue - Anemia Profile B   Labs pending Health Maintenance reviewed Diet and exercise encouraged  Follow up plan: 4 months   [name], FNP

## 2022-02-07 NOTE — Patient Instructions (Signed)
Fatigue If you have fatigue, you feel tired all the time and have a lack of energy or a lack of motivation. Fatigue may make it difficult to start or complete tasks because of exhaustion. Occasional or mild fatigue is often a normal response to activity or life. However, long-term (chronic) or extreme fatigue may be a symptom of a medical condition such as: Depression. Not having enough red blood cells or hemoglobin in the blood (anemia). A problem with a small gland located in the lower front part of the neck (thyroid disorder). Rheumatologic conditions. These are problems related to the body's defense system (immune system). Infections, especially certain viral infections. Fatigue can also lead to negative health outcomes over time. Follow these instructions at home: Medicines Take over-the-counter and prescription medicines only as told by your health care provider. Take a multivitamin if told by your health care provider. Do not use herbal or dietary supplements unless they are approved by your health care provider. Eating and drinking  Avoid heavy meals in the evening. Eat a well-balanced diet, which includes lean proteins, whole grains, plenty of fruits and vegetables, and low-fat dairy products. Avoid eating or drinking too many products with caffeine in them. Avoid alcohol. Drink enough fluid to keep your urine pale yellow. Activity  Exercise regularly, as told by your health care provider. Use or practice techniques to help you relax, such as yoga, tai chi, meditation, or massage therapy. Lifestyle Change situations that cause you stress. Try to keep your work and personal schedules in balance. Do not use recreational or illegal drugs. General instructions Monitor your fatigue for any changes. Go to bed and get up at the same time every day. Avoid fatigue by pacing yourself during the day and getting enough sleep at night. Maintain a healthy weight. Contact a health care  provider if: Your fatigue does not get better. You have a fever. You suddenly lose or gain weight. You have headaches. You have trouble falling asleep or sleeping through the night. You feel angry, guilty, anxious, or sad. You have swelling in your legs or another part of your body. Get help right away if: You feel confused, feel like you might faint, or faint. Your vision is blurry or you have a severe headache. You have severe pain in your abdomen, your back, or the area between your waist and hips (pelvis). You have chest pain, shortness of breath, or an irregular or fast heartbeat. You are unable to urinate, or you urinate less than normal. You have abnormal bleeding from the rectum, nose, lungs, nipples, or, if you are female, the vagina. You vomit blood. You have thoughts about hurting yourself or others. These symptoms may be an emergency. Get help right away. Call 911. Do not wait to see if the symptoms will go away. Do not drive yourself to the hospital. Get help right away if you feel like you may hurt yourself or others, or have thoughts about taking your own life. Go to your nearest emergency room or: Call 911. Call the [facility] at [phone] or 988. This is open 24 hours a day. Text the Crisis Text Line at 979-823-0385. Summary If you have fatigue, you feel tired all the time and have a lack of energy or a lack of motivation. Fatigue may make it difficult to start or complete tasks because of exhaustion. Long-term (chronic) or extreme fatigue may be a symptom of a medical condition. Exercise regularly, as told by your health care provider.  Change situations that cause you stress. Try to keep your work and personal schedules in balance. This information is not intended to replace advice given to you by your health care provider. Make sure you discuss any questions you have with your health care provider. Document Revised: 06/06/2021 Document  Reviewed: 06/06/2021 Elsevier Patient Education  2023 [employer]  

## 2022-02-08 LAB — CMP14+EGFR
ALT: 18 IU/L (ref 0–32)
AST: 33 IU/L (ref 0–40)
Albumin/Globulin Ratio: 1.7 (ref 1.2–2.2)
Albumin: 4.7 g/dL (ref 3.8–4.8)
Alkaline Phosphatase: 82 IU/L (ref 44–121)
BUN/Creatinine Ratio: 15 (ref 9–23)
BUN: 11 mg/dL (ref 6–24)
Bilirubin Total: 1.2 mg/dL (ref 0.0–1.2)
CO2: 19 mmol/L — ABNORMAL LOW (ref 20–29)
Calcium: 9.7 mg/dL (ref 8.7–10.2)
Chloride: 96 mmol/L (ref 96–106)
Creatinine, Ser: 0.73 mg/dL (ref 0.57–1.00)
Globulin, Total: 2.7 g/dL (ref 1.5–4.5)
Glucose: 138 mg/dL — ABNORMAL HIGH (ref 70–99)
Potassium: 3.8 mmol/L (ref 3.5–5.2)
Sodium: 134 mmol/L (ref 134–144)
Total Protein: 7.4 g/dL (ref 6.0–8.5)
eGFR: 104 mL/min/{1.73_m2} (ref >59)

## 2022-02-08 LAB — ANEMIA PROFILE B
Basophils Absolute: 0.1 10*3/uL (ref 0.0–0.2)
Basos: 1 %
EOS (ABSOLUTE): 0.1 10*3/uL (ref 0.0–0.4)
Eos: 1 %
Ferritin: 103 ng/mL (ref 15–150)
Folate: 4.6 ng/mL (ref >3.0)
Hematocrit: 41 % (ref 34.0–46.6)
Hemoglobin: 14.2 g/dL (ref 11.1–15.9)
Immature Grans (Abs): 0 10*3/uL (ref 0.0–0.1)
Immature Granulocytes: 0 %
Iron Saturation: 49 % (ref 15–55)
Iron: 165 ug/dL — ABNORMAL HIGH (ref 27–159)
Lymphocytes Absolute: 1.6 10*3/uL (ref 0.7–3.1)
Lymphs: 16 %
MCH: 29.8 pg (ref 26.6–33.0)
MCHC: 34.6 g/dL (ref 31.5–35.7)
MCV: 86 fL (ref 79–97)
Monocytes Absolute: 0.6 10*3/uL (ref 0.1–0.9)
Monocytes: 6 %
Neutrophils Absolute: 7.4 10*3/uL — ABNORMAL HIGH (ref 1.4–7.0)
Neutrophils: 76 %
Platelets: 195 10*3/uL (ref 150–450)
RBC: 4.77 x10E6/uL (ref 3.77–5.28)
RDW: 13.2 % (ref 11.7–15.4)
Retic Ct Pct: 1 % (ref 0.6–2.6)
Total Iron Binding Capacity: 336 ug/dL (ref 250–450)
UIBC: 171 ug/dL (ref 131–425)
Vitamin B-12: 502 pg/mL (ref 232–1245)
WBC: 9.7 10*3/uL (ref 3.4–10.8)

## 2022-05-12 ENCOUNTER — Telehealth: Payer: 59 | Admitting: Emergency Medicine

## 2022-05-12 DIAGNOSIS — R051 Acute cough: Secondary | ICD-10-CM

## 2022-05-12 MED ORDER — AZITHROMYCIN 250 MG PO TABS: ORAL_TABLET | ORAL | 0 refills | Status: DC

## 2022-05-12 MED ORDER — BENZONATATE 100 MG PO CAPS: 100.0000 mg | ORAL_CAPSULE | Freq: Two times a day (BID) | ORAL | 0 refills | Status: DC | PRN

## 2022-05-12 MED ORDER — ALBUTEROL SULFATE HFA 108 (90 BASE) MCG/ACT IN AERS: 2.0000 | INHALATION_SPRAY | RESPIRATORY_TRACT | 0 refills | Status: AC | PRN

## 2022-05-12 NOTE — Progress Notes (Signed)
We are sorry that you are not feeling well.  Here is how we plan to help!  Based on your presentation I believe you most likely have A cough due to bacteria.  When patients have a fever and a productive cough with a change in color or increased sputum production, we are concerned about bacterial bronchitis.  If left untreated it can progress to pneumonia.  If your symptoms do not improve with your treatment plan it is important that you contact your provider.   I have prescribed Azithromyin 250 mg: two tablets now and then one tablet daily for 4 additonal days    In addition you may use A prescription cough medication called Tessalon Perles 100mg . You may take 1-2 capsules every 8 hours as needed for your cough.  I've also called in an albuterol inhaler.  From your responses in the eVisit questionnaire you describe inflammation in the upper respiratory tract which is causing a significant cough.  This is commonly called Bronchitis and has four common causes:   Allergies Viral Infections Acid Reflux Bacterial Infection Allergies, viruses and acid reflux are treated by controlling symptoms or eliminating the cause. An example might be a cough caused by taking certain blood pressure medications. You stop the cough by changing the medication. Another example might be a cough caused by acid reflux. Controlling the reflux helps control the cough.  USE OF BRONCHODILATOR ("RESCUE") INHALERS: There is a risk from using your bronchodilator too frequently.  The risk is that over-reliance on a medication which only relaxes the muscles surrounding the breathing tubes can reduce the effectiveness of medications prescribed to reduce swelling and congestion of the tubes themselves.  Although you feel brief relief from the bronchodilator inhaler, your asthma may actually be worsening with the tubes becoming more swollen and filled with mucus.  This can delay other crucial treatments, such as oral steroid medications.  If you need to use a bronchodilator inhaler daily, several times per day, you should discuss this with your provider.  There are probably better treatments that could be used to keep your asthma under control.     HOME CARE Only take medications as instructed by your medical team. Complete the entire course of an antibiotic. Drink plenty of fluids and get plenty of rest. Avoid close contacts especially the very young and the elderly Cover your mouth if you cough or cough into your sleeve. Always remember to wash your hands A steam or ultrasonic humidifier can help congestion.   GET HELP RIGHT AWAY IF: You develop worsening fever. You become short of breath You cough up blood. Your symptoms persist after you have completed your treatment plan MAKE SURE YOU  Understand these instructions. Will watch your condition. Will get help right away if you are not doing well or get worse.    Thank you for choosing an e-visit.  Your e-visit answers were reviewed by a board certified advanced clinical practitioner to complete your personal care plan. Depending upon the condition, your plan could have included both over the counter or prescription medications.  Please review your pharmacy choice. Make sure the pharmacy is open so you can pick up prescription now. If there is a problem, you may contact your provider through and have the prescription routed to another pharmacy.  Your safety is important to [employer]. If you have drug allergies check your prescription carefully.   For the next 24 hours you can use MyChart to ask questions about today's visit,  request a non-urgent call back, or ask for a work or school excuse. You will get an email in the next two days asking about your experience. I hope that your e-visit has been valuable and will speed your recovery.  Approximately 5 minutes was used in reviewing the patient's chart, questionnaire, prescribing medications, and  documentation.

## 2022-06-09 ENCOUNTER — Encounter: Payer: Self-pay | Admitting: Family

## 2022-06-09 ENCOUNTER — Ambulatory Visit (INDEPENDENT_AMBULATORY_CARE_PROVIDER_SITE_OTHER): Payer: 59 | Admitting: Family

## 2022-06-09 VITALS — BP 126/84 | HR 77 | Temp 97.3°F | Ht 66.0 in | Wt 115.6 lb

## 2022-06-09 DIAGNOSIS — R002 Palpitations: Secondary | ICD-10-CM

## 2022-06-09 DIAGNOSIS — F332 Major depressive disorder, recurrent severe without psychotic features: Secondary | ICD-10-CM

## 2022-06-09 DIAGNOSIS — E781 Pure hyperglyceridemia: Secondary | ICD-10-CM

## 2022-06-09 DIAGNOSIS — F5104 Psychophysiologic insomnia: Secondary | ICD-10-CM

## 2022-06-09 DIAGNOSIS — M542 Cervicalgia: Secondary | ICD-10-CM | POA: Diagnosis not present

## 2022-06-09 DIAGNOSIS — I471 Supraventricular tachycardia, unspecified: Secondary | ICD-10-CM

## 2022-06-09 DIAGNOSIS — F17200 Nicotine dependence, unspecified, uncomplicated: Secondary | ICD-10-CM

## 2022-06-09 DIAGNOSIS — Z0001 Encounter for general adult medical examination with abnormal findings: Secondary | ICD-10-CM | POA: Diagnosis not present

## 2022-06-09 DIAGNOSIS — Z0000 Encounter for general adult medical examination without abnormal findings: Secondary | ICD-10-CM

## 2022-06-09 DIAGNOSIS — I10 Essential (primary) hypertension: Secondary | ICD-10-CM | POA: Diagnosis not present

## 2022-06-09 DIAGNOSIS — F411 Generalized anxiety disorder: Secondary | ICD-10-CM

## 2022-06-09 DIAGNOSIS — F1020 Alcohol dependence, uncomplicated: Secondary | ICD-10-CM

## 2022-06-09 MED ORDER — IBUPROFEN 800 MG PO TABS: 800.0000 mg | ORAL_TABLET | Freq: Three times a day (TID) | ORAL | 1 refills | Status: DC | PRN

## 2022-06-09 NOTE — Patient Instructions (Signed)
Supraventricular Tachycardia, Adult Supraventricular tachycardia (SVT) is a type of abnormal heart rhythm. It causes the heart to beat very quickly. SVT can start suddenly and last for a short time, which is called paroxysmal SVT, or it may last longer and require specialized treatment to return the heart rhythm to normal. A normal resting heart rate is 60-100 beats per minute. During an episode of SVT, your heart rate may be higher than 150 beats per minute. Episodes of SVT can be frightening, but they are usually not dangerous. However, if episodes happen several times a day or last longer than a few seconds, they may lead to heart failure. What are the causes?  Usually, a normal heartbeat starts when an area called the sinoatrial node releases an electrical signal. In SVT, other areas of the heart send out electrical signals that interfere with the signal from the sinoatrial node. The cause of this abnormal electrical activity is not known. What increases the risk? You are more likely to develop this condition if you are: Middle aged or younger. Female. The following factors may also make you more likely to develop this condition: Stress or anxiety. Tiredness. Smoking. Stimulant drugs, such as cocaine and methamphetamine. Alcohol. Caffeine. Pregnancy. Having any of these conditions: A thyroid condition. Diabetes mellitus. Obstructive sleep apnea. What are the signs or symptoms? Symptoms of this condition include: A pounding heart. A feeling that the heart is skipping beats (palpitations). Weakness. Shortness of breath. Tightness or pain in your chest. Light-headedness or dizziness. Anxiety. Sweating. Nausea. Fainting. Fatigue or tiredness. A mild episode may not cause symptoms. How is this diagnosed? This condition may be diagnosed based on: Your symptoms. A physical exam. If you have an episode of SVT during the exam, the health care provider may be able to diagnose SVT by  listening to your heart and feeling your pulse. Tests. These may include: An electrocardiogram (ECG). This test is done to check for problems with electrical activity in the heart. A Holter monitor or event monitor test. This test involves wearing a portable device that monitors your heart rate over time. An echocardiogram. This test involves taking an image of your heart using sound waves. It is done to rule out other causes of a fast heart rate. A stress echocardiogram. This test involves doing an echocardiogram when you are at rest and after exercise. Blood tests. An electrophysiology study (EPS). This tests the electrical activity in your heart to find where the abnormal heart rhythm is coming from using cardiac catheters. How is this treated? This condition may be treated with: Vagal nerve stimulation. This involves stimulating your vagus nerve, which is a nerve that runs from the chest, through the neck, to the lower part of the brain. Stimulating this nerve can slow down the heart. It is often the first and only treatment that is needed for this condition. Work with your health care provider to find which technique works best for you. Ways to do this treatment include: Laying on your back, then holding your breath and pushing, as though you are having a bowel movement. Massaging an area on one side of your neck, below your jaw. Do not try this yourself. Only a health care provider should do this. If done the wrong way, it can lead to a stroke. Bending forward with your head between your legs. Coughing while bending forward with your head between your legs. Applying an ice-cold, wet towel to your face. Medicines that prevent attacks. Medicine to stop an  attack. The medicine is given through an IV at the hospital. A small electric shock (cardioversion) that stops an attack. Before you get the shock, you will get medicine to make you fall asleep. Radiofrequency ablation. In this procedure, a  small, thin tube (catheter) is used to send radiofrequency energy to the area of tissue that is causing the rapid heartbeats. The energy kills the cells and helps your heart keep a normal rhythm. You may have this treatment if you have symptoms of SVT often. If you do not have symptoms, you may not need treatment. Follow these instructions at home: Stress Avoid stressful situations when possible. Find healthy ways of managing stress, such as: Taking part in relaxing activities, such as yoga, meditation, or being out in nature. Listening to relaxing music. Practicing relaxation techniques, such as deep breathing. Leading a healthy lifestyle. This involves getting plenty of sleep, exercising, and eating a balanced diet. Attending counseling or talk therapy with a mental health professional. Lifestyle  Try to get at least 7 hours of sleep each night. Do not use any products that contain nicotine or tobacco. These products include cigarettes, chewing tobacco, and vaping devices, such as e-cigarettes. If you need help quitting, ask your health care provider. Do not drink alcohol if it triggers episodes of SVT. If alcohol does not seem to trigger episodes, limit your alcohol intake. If you drink alcohol: Limit how much you have to: 0-1 drink a day for women who are not pregnant. 0-2 drinks a day for men. Know how much alcohol is in your drink. In the [place] one drink equals one 12 oz bottle of beer (355 mL), one 5 oz glass of wine (148 mL), or one 1 oz glass of hard liquor (44 mL). Be aware of how caffeine affects your condition. If caffeine: Triggers episodes of SVT, do not eat, drink, or use anything with caffeine in it. Does not seem to trigger episodes, consume caffeine in moderation. Do not use stimulant drugs. If you need help quitting, talk with your health care provider. General instructions Maintain a healthy weight. Exercise regularly. Ask your health care provider to suggest some good  activities for you. Aim for one or a combination of the following: 150 minutes per week of moderate exercise, such as walking or yoga. 75 minutes per week of vigorous exercise, such as running or swimming. Perform vagus nerve stimulation as directed by your health care provider. Take over-the-counter and prescription medicines only as told by your health care provider. Keep all follow-up visits. This is important. Contact a health care provider if: You have episodes of SVT more often than before. Episodes of SVT last longer than before. Vagus nerve stimulation is no longer helping. You have new symptoms. Get help right away if: You have chest pain. Your symptoms get worse. You have trouble breathing. You have an episode of SVT that lasts longer than 20 minutes. You faint. These symptoms may represent a serious problem that is an emergency. Do not wait to see if the symptoms will go away. Get medical help right away. Call your local emergency services (911 in the [place] Do not drive yourself to the hospital. Summary Supraventricular tachycardia (SVT) is a type of abnormal heart rhythm. During an episode of SVT, your heart rate may be higher than 150 beats per minute. If you do not have symptoms, you may not need treatment. This information is not intended to replace advice given to you by your health care provider. Make sure   you discuss any questions you have with your health care provider. Document Revised: 03/27/2020 Document Reviewed: 03/27/2020 Elsevier Patient Education  2023 [employer].

## 2022-06-09 NOTE — Progress Notes (Signed)
Subjective:    Patient ID: Brandy Vega, female    DOB: Oct 25, 1975, 46 y.o.   MRN: [id number]  Chief Complaint  Patient presents with   Medical Management of Chronic Issues   Pt presents to the office today for CPE and chronic follow up.    She states her anxiety has worsen because the decrease health and Cardiologists recommended a catheter ablation. This has made her very anxious. She has not has this done.    She has SVT and her symptoms are stable at this time. Could not tolerate beta blocker.    Reports she is drinking 2 12 oz beers a week.  Hypertension This is a chronic problem. The current episode started more than 1 year ago. The problem has been resolved since onset. The problem is controlled. Associated symptoms include anxiety, malaise/fatigue and palpitations. Pertinent negatives include no peripheral edema or shortness of breath. Risk factors for coronary artery disease include sedentary lifestyle. The current treatment provides moderate improvement.  Anxiety Presents for follow-up visit. Symptoms include excessive worry, insomnia, irritability, nervous/anxious behavior, palpitations and panic. Patient reports no shortness of breath. Symptoms occur most days. The severity of symptoms is moderate.    Insomnia Primary symptoms: difficulty falling asleep, frequent awakening, malaise/fatigue.   The current episode started more than one year. The problem occurs intermittently. Past treatments include medication. The treatment provided moderate relief.  Nicotine Dependence Presents for follow-up visit. Symptoms include insomnia and irritability. Her urge triggers include company of smokers. The symptoms have been stable. She smokes < 1/2 a pack of cigarettes per day.      Review of Systems  Constitutional:  Positive for irritability and malaise/fatigue.  Respiratory:  Negative for shortness of breath.   Cardiovascular:  Positive for palpitations.  Psychiatric/Behavioral:   The patient is nervous/anxious and has insomnia.   All other systems reviewed and are negative.  Family History  Problem Relation Age of Onset   Hypertension Mother    Diabetes Father    Social History   Socioeconomic History   Marital status: Single    Spouse name: Not on file   Number of children: Not on file   Years of education: Not on file   Highest education level: Not on file  Occupational History   Not on file  Tobacco Use   Smoking status: Every Day    Packs/day: 0.50    Types: Cigarettes   Smokeless tobacco: Never  Vaping Use   Vaping Use: Never used  Substance and Sexual Activity   Alcohol use: Yes    Comment: occ. use   Drug use: No   Sexual activity: Yes    Birth control/protection: None  Other Topics Concern   Not on file  Social History Narrative   Lives with boyfriend.     Social Determinants of Health   Financial Resource Strain: Not on file  Food Insecurity: Not on file  Transportation Needs: Not on file  Physical Activity: Not on file  Stress: Not on file  Social Connections: Not on file       Objective:   Physical Exam Vitals reviewed.  Constitutional:      General: She is not in acute distress.    Appearance: She is well-developed.  HENT:     Head: Normocephalic and atraumatic.     Right Ear: Tympanic membrane normal.     Left Ear: Tympanic membrane normal.  Eyes:     Pupils: Pupils are equal, round, and reactive to  light.  Neck:     Thyroid: No thyromegaly.  Cardiovascular:     Rate and Rhythm: Normal rate and regular rhythm.     Heart sounds: Normal heart sounds. No murmur heard. Pulmonary:     Effort: Pulmonary effort is normal. No respiratory distress.     Breath sounds: Normal breath sounds. No wheezing.  Abdominal:     General: Bowel sounds are normal. There is no distension.     Palpations: Abdomen is soft.     Tenderness: There is no abdominal tenderness.  Musculoskeletal:        General: No tenderness. Normal range  of motion.     Cervical back: Normal range of motion and neck supple.  Skin:    General: Skin is warm and dry.  Neurological:     Mental Status: She is alert and oriented to person, place, and time.     Cranial Nerves: No cranial nerve deficit.     Deep Tendon Reflexes: Reflexes are normal and symmetric.  Psychiatric:        Behavior: Behavior normal.        Thought Content: Thought content normal.        Judgment: Judgment normal.       BP 126/84   Pulse 77   Temp (!) 97.3 F (36.3 C) (Temporal)   Ht 5' 6"  (1.676 m)   Wt 115 lb 9.6 oz (52.4 kg)   BMI 18.66 kg/m      Assessment & Plan:  Hallee Mckenny comes in today with chief complaint of Medical Management of Chronic Issues   Diagnosis and orders addressed:  1. Musculoskeletal neck pain - ibuprofen (ADVIL) 800 MG tablet; Take 1 tablet (800 mg total) by mouth every 8 (eight) hours as needed.  Dispense: 90 tablet; Refill: 1 - CMP14+EGFR - CBC with Differential/Platelet  2. Essential hypertension - CMP14+EGFR - CBC with Differential/Platelet  3. SVT (supraventricular tachycardia) - CMP14+EGFR - CBC with Differential/Platelet  4. Severe episode of recurrent major depressive disorder, without psychotic features ([place]) - CMP14+EGFR - CBC with Differential/Platelet  5. GAD (generalized anxiety disorder) - CMP14+EGFR - CBC with Differential/Platelet  6. Alcohol use disorder, severe, dependence ([place]) - CMP14+EGFR - CBC with Differential/Platelet  7. Current smoker - CMP14+EGFR - CBC with Differential/Platelet  8. Chronic insomni - CMP14+EGFR - CBC with Differential/Platelet  9. Hypertriglyceridemia - CMP14+EGFR - CBC with Differential/Platelet - Lipid panel  10. Palpitations  - CMP14+EGFR - CBC with Differential/Platelet  11. Annual physical exam - CMP14+EGFR - CBC with Differential/Platelet - Lipid panel - TSH   Labs pending Health Maintenance reviewed Diet and exercise encouraged  Follow  up plan: 4 months   [name], FNP

## 2022-06-12 LAB — CBC WITH DIFFERENTIAL/PLATELET
Basophils Absolute: 0.1 10*3/uL (ref 0.0–0.2)
Basos: 1 %
EOS (ABSOLUTE): 0.2 10*3/uL (ref 0.0–0.4)
Eos: 2 %
Hematocrit: 40.9 % (ref 34.0–46.6)
Hemoglobin: 13.7 g/dL (ref 11.1–15.9)
Immature Grans (Abs): 0 10*3/uL (ref 0.0–0.1)
Immature Granulocytes: 0 %
Lymphocytes Absolute: 1.6 10*3/uL (ref 0.7–3.1)
Lymphs: 20 %
MCH: 29.5 pg (ref 26.6–33.0)
MCHC: 33.5 g/dL (ref 31.5–35.7)
MCV: 88 fL (ref 79–97)
Monocytes Absolute: 0.6 10*3/uL (ref 0.1–0.9)
Monocytes: 7 %
Neutrophils Absolute: 5.6 10*3/uL (ref 1.4–7.0)
Neutrophils: 70 %
Platelets: 233 10*3/uL (ref 150–450)
RBC: 4.64 x10E6/uL (ref 3.77–5.28)
RDW: 12.8 % (ref 11.7–15.4)
WBC: 8 10*3/uL (ref 3.4–10.8)

## 2022-06-12 LAB — TSH: TSH: 0.596 u[IU]/mL (ref 0.450–4.500)

## 2022-06-12 LAB — LIPID PANEL
Chol/HDL Ratio: 1.8 ratio (ref 0.0–4.4)
Cholesterol, Total: 162 mg/dL (ref 100–199)
HDL: 89 mg/dL (ref >39)
LDL Chol Calc (NIH): 60 mg/dL (ref 0–99)
Triglycerides: 65 mg/dL (ref 0–149)
VLDL Cholesterol Cal: 13 mg/dL (ref 5–40)

## 2022-06-12 LAB — CMP14+EGFR
ALT: 13 IU/L (ref 0–32)
AST: 27 IU/L (ref 0–40)
Albumin/Globulin Ratio: 2 (ref 1.2–2.2)
Albumin: 4.8 g/dL (ref 3.9–4.9)
Alkaline Phosphatase: 64 IU/L (ref 44–121)
BUN/Creatinine Ratio: 8 — ABNORMAL LOW (ref 9–23)
BUN: 6 mg/dL (ref 6–24)
Bilirubin Total: 0.3 mg/dL (ref 0.0–1.2)
CO2: 20 mmol/L (ref 20–29)
Calcium: 9.6 mg/dL (ref 8.7–10.2)
Chloride: 99 mmol/L (ref 96–106)
Creatinine, Ser: 0.77 mg/dL (ref 0.57–1.00)
Globulin, Total: 2.4 g/dL (ref 1.5–4.5)
Glucose: 88 mg/dL (ref 70–99)
Potassium: 5.9 mmol/L (ref 3.5–5.2)
Sodium: 134 mmol/L (ref 134–144)
Total Protein: 7.2 g/dL (ref 6.0–8.5)
eGFR: 97 mL/min/{1.73_m2} (ref >59)

## 2022-06-16 ENCOUNTER — Other Ambulatory Visit: Payer: Self-pay | Admitting: Family Medicine

## 2022-06-16 ENCOUNTER — Other Ambulatory Visit: Payer: 59

## 2022-06-16 DIAGNOSIS — E875 Hyperkalemia: Secondary | ICD-10-CM

## 2022-06-16 LAB — CMP14+EGFR
ALT: 11 IU/L (ref 0–32)
AST: 19 IU/L (ref 0–40)
Albumin/Globulin Ratio: 2.2 (ref 1.2–2.2)
Albumin: 4.3 g/dL (ref 3.9–4.9)
Alkaline Phosphatase: 63 IU/L (ref 44–121)
BUN/Creatinine Ratio: 7 — ABNORMAL LOW (ref 9–23)
BUN: 6 mg/dL (ref 6–24)
Bilirubin Total: 0.4 mg/dL (ref 0.0–1.2)
CO2: 19 mmol/L — ABNORMAL LOW (ref 20–29)
Calcium: 9.4 mg/dL (ref 8.7–10.2)
Chloride: 103 mmol/L (ref 96–106)
Creatinine, Ser: 0.82 mg/dL (ref 0.57–1.00)
Globulin, Total: 2 g/dL (ref 1.5–4.5)
Glucose: 99 mg/dL (ref 70–99)
Potassium: 4.2 mmol/L (ref 3.5–5.2)
Sodium: 137 mmol/L (ref 134–144)
Total Protein: 6.3 g/dL (ref 6.0–8.5)
eGFR: 90 mL/min/{1.73_m2} (ref >59)

## 2022-07-23 ENCOUNTER — Emergency Department (HOSPITAL_COMMUNITY)
Admission: EM | Admit: 2022-07-23 | Discharge: 2022-07-23 | Disposition: A | Discharge status: Other Acute Inpatient Hospital | Payer: 59 | Attending: Emergency Medicine | Admitting: Emergency Medicine

## 2022-07-23 ENCOUNTER — Other Ambulatory Visit: Payer: Self-pay

## 2022-07-23 ENCOUNTER — Encounter (HOSPITAL_COMMUNITY): Payer: Self-pay | Admitting: Emergency Medicine

## 2022-07-23 ENCOUNTER — Other Ambulatory Visit (HOSPITAL_COMMUNITY)
Admission: EM | Admit: 2022-07-23 | Discharge: 2022-07-25 | Disposition: A | Discharge status: Home / Self Care | Payer: 59 | Attending: Psychiatry | Admitting: Psychiatry

## 2022-07-23 DIAGNOSIS — F411 Generalized anxiety disorder: Secondary | ICD-10-CM

## 2022-07-23 DIAGNOSIS — I10 Essential (primary) hypertension: Secondary | ICD-10-CM

## 2022-07-23 DIAGNOSIS — F339 Major depressive disorder, recurrent, unspecified: Secondary | ICD-10-CM

## 2022-07-23 DIAGNOSIS — Y908 Blood alcohol level of 240 mg/100 ml or more: Secondary | ICD-10-CM | POA: Insufficient documentation

## 2022-07-23 DIAGNOSIS — F1020 Alcohol dependence, uncomplicated: Secondary | ICD-10-CM | POA: Diagnosis not present

## 2022-07-23 DIAGNOSIS — Z1152 Encounter for screening for COVID-19: Secondary | ICD-10-CM | POA: Insufficient documentation

## 2022-07-23 DIAGNOSIS — F1994 Other psychoactive substance use, unspecified with psychoactive substance-induced mood disorder: Secondary | ICD-10-CM | POA: Insufficient documentation

## 2022-07-23 DIAGNOSIS — F1010 Alcohol abuse, uncomplicated: Secondary | ICD-10-CM | POA: Diagnosis present

## 2022-07-23 DIAGNOSIS — F332 Major depressive disorder, recurrent severe without psychotic features: Secondary | ICD-10-CM | POA: Diagnosis not present

## 2022-07-23 DIAGNOSIS — R45851 Suicidal ideations: Secondary | ICD-10-CM | POA: Insufficient documentation

## 2022-07-23 DIAGNOSIS — F10220 Alcohol dependence with intoxication, uncomplicated: Secondary | ICD-10-CM | POA: Diagnosis not present

## 2022-07-23 DIAGNOSIS — F1011 Alcohol abuse, in remission: Secondary | ICD-10-CM | POA: Diagnosis present

## 2022-07-23 DIAGNOSIS — F17200 Nicotine dependence, unspecified, uncomplicated: Secondary | ICD-10-CM

## 2022-07-23 DIAGNOSIS — F10920 Alcohol use, unspecified with intoxication, uncomplicated: Secondary | ICD-10-CM

## 2022-07-23 DIAGNOSIS — Z046 Encounter for general psychiatric examination, requested by authority: Secondary | ICD-10-CM | POA: Diagnosis present

## 2022-07-23 LAB — LIPID PANEL
Cholesterol: 195 mg/dL (ref 0–200)
HDL: 105 mg/dL (ref >40)
LDL Cholesterol: 77 mg/dL (ref 0–99)
Total CHOL/HDL Ratio: 1.9 RATIO
Triglycerides: 63 mg/dL (ref <150)
VLDL: 13 mg/dL (ref 0–40)

## 2022-07-23 LAB — POC URINE PREG, ED: Preg Test, Ur: NEGATIVE

## 2022-07-23 LAB — RAPID URINE DRUG SCREEN, HOSP PERFORMED
Amphetamines: NOT DETECTED
Barbiturates: NOT DETECTED
Benzodiazepines: NOT DETECTED
Cocaine: NOT DETECTED
Opiates: NOT DETECTED
Tetrahydrocannabinol: NOT DETECTED

## 2022-07-23 LAB — RESP PANEL BY RT-PCR (FLU A&B, COVID) ARPGX2
Influenza A by PCR: NEGATIVE
Influenza B by PCR: NEGATIVE
SARS Coronavirus 2 by RT PCR: NEGATIVE

## 2022-07-23 LAB — CBC
HCT: 41.6 % (ref 36.0–46.0)
Hemoglobin: 13.9 g/dL (ref 12.0–15.0)
MCH: 29.3 pg (ref 26.0–34.0)
MCHC: 33.4 g/dL (ref 30.0–36.0)
MCV: 87.8 fL (ref 80.0–100.0)
Platelets: 298 10*3/uL (ref 150–400)
RBC: 4.74 MIL/uL (ref 3.87–5.11)
RDW: 13.9 % (ref 11.5–15.5)
WBC: 7.7 10*3/uL (ref 4.0–10.5)
nRBC: 0 % (ref 0.0–0.2)

## 2022-07-23 LAB — COMPREHENSIVE METABOLIC PANEL
ALT: 22 U/L (ref 0–44)
AST: 36 U/L (ref 15–41)
Albumin: 4.8 g/dL (ref 3.5–5.0)
Alkaline Phosphatase: 65 U/L (ref 38–126)
Anion gap: 15 (ref 5–15)
BUN: 9 mg/dL (ref 6–20)
CO2: 18 mmol/L — ABNORMAL LOW (ref 22–32)
Calcium: 8.9 mg/dL (ref 8.9–10.3)
Chloride: 110 mmol/L (ref 98–111)
Creatinine, Ser: 0.66 mg/dL (ref 0.44–1.00)
GFR, Estimated: >60 mL/min (ref >60)
Glucose, Bld: 75 mg/dL (ref 70–99)
Potassium: 3.6 mmol/L (ref 3.5–5.1)
Sodium: 143 mmol/L (ref 135–145)
Total Bilirubin: 0.5 mg/dL (ref 0.3–1.2)
Total Protein: 8.1 g/dL (ref 6.5–8.1)

## 2022-07-23 LAB — ETHANOL: Alcohol, Ethyl (B): 352 mg/dL (ref <10)

## 2022-07-23 MED ORDER — ALUM & MAG HYDROXIDE-SIMETH 200-200-20 MG/5ML PO SUSP: 30.0000 mL | Freq: Four times a day (QID) | ORAL | Status: DC | PRN

## 2022-07-23 MED ORDER — LORAZEPAM 1 MG PO TABS: 0.0000 mg | ORAL_TABLET | Freq: Two times a day (BID) | ORAL | Status: DC

## 2022-07-23 MED ORDER — ADULT MULTIVITAMIN W/MINERALS CH
1.0000 | ORAL_TABLET | Freq: Every day | ORAL | Status: DC
  Administered 2022-07-24 – 2022-07-25 (×2): 1 via ORAL
  Filled 2022-07-23 (×2): qty 1

## 2022-07-23 MED ORDER — ESCITALOPRAM OXALATE 10 MG PO TABS: 20.0000 mg | ORAL_TABLET | Freq: Every day | ORAL | Status: DC

## 2022-07-23 MED ORDER — THIAMINE MONONITRATE 100 MG PO TABS
100.0000 mg | ORAL_TABLET | Freq: Every day | ORAL | Status: DC
  Administered 2022-07-23: 100 mg via ORAL
  Filled 2022-07-23: qty 1

## 2022-07-23 MED ORDER — ACETAMINOPHEN 325 MG PO TABS
650.0000 mg | ORAL_TABLET | Freq: Four times a day (QID) | ORAL | Status: DC | PRN
  Filled 2022-07-23: qty 2

## 2022-07-23 MED ORDER — LOPERAMIDE HCL 2 MG PO CAPS: 2.0000 mg | ORAL_CAPSULE | ORAL | Status: DC | PRN

## 2022-07-23 MED ORDER — FLUTICASONE PROPIONATE 50 MCG/ACT NA SUSP: 2.0000 | Freq: Every day | NASAL | Status: DC

## 2022-07-23 MED ORDER — HYDROXYZINE HCL 25 MG PO TABS: 25.0000 mg | ORAL_TABLET | Freq: Four times a day (QID) | ORAL | Status: DC | PRN

## 2022-07-23 MED ORDER — LORAZEPAM 1 MG PO TABS: 1.0000 mg | ORAL_TABLET | Freq: Four times a day (QID) | ORAL | Status: DC | PRN

## 2022-07-23 MED ORDER — LORAZEPAM 2 MG/ML IJ SOLN: 1.0000 mg | INTRAMUSCULAR | Status: DC | PRN

## 2022-07-23 MED ORDER — NICOTINE 7 MG/24HR TD PT24: 7.0000 mg | MEDICATED_PATCH | Freq: Every day | TRANSDERMAL | Status: DC

## 2022-07-23 MED ORDER — LORAZEPAM 1 MG PO TABS: 1.0000 mg | ORAL_TABLET | ORAL | Status: DC | PRN

## 2022-07-23 MED ORDER — ALBUTEROL SULFATE HFA 108 (90 BASE) MCG/ACT IN AERS: 2.0000 | INHALATION_SPRAY | RESPIRATORY_TRACT | Status: DC | PRN

## 2022-07-23 MED ORDER — THIAMINE HCL 100 MG/ML IJ SOLN: 100.0000 mg | Freq: Every day | INTRAMUSCULAR | Status: DC

## 2022-07-23 MED ORDER — ADULT MULTIVITAMIN W/MINERALS CH
1.0000 | ORAL_TABLET | Freq: Every day | ORAL | Status: DC
  Administered 2022-07-23: 1 via ORAL
  Filled 2022-07-23: qty 1

## 2022-07-23 MED ORDER — ALUM & MAG HYDROXIDE-SIMETH 200-200-20 MG/5ML PO SUSP: 30.0000 mL | ORAL | Status: DC | PRN

## 2022-07-23 MED ORDER — LORAZEPAM 1 MG PO TABS: 0.0000 mg | ORAL_TABLET | Freq: Four times a day (QID) | ORAL | Status: DC

## 2022-07-23 MED ORDER — ZIPRASIDONE MESYLATE 20 MG IM SOLR
10.0000 mg | Freq: Once | INTRAMUSCULAR | Status: AC
  Administered 2022-07-23: 10 mg via INTRAMUSCULAR
  Filled 2022-07-23: qty 20

## 2022-07-23 MED ORDER — ONDANSETRON 4 MG PO TBDP: 4.0000 mg | ORAL_TABLET | Freq: Four times a day (QID) | ORAL | Status: DC | PRN

## 2022-07-23 MED ORDER — ACETAMINOPHEN 325 MG PO TABS: 650.0000 mg | ORAL_TABLET | ORAL | Status: DC | PRN

## 2022-07-23 MED ORDER — MAGNESIUM HYDROXIDE 400 MG/5ML PO SUSP: 30.0000 mL | Freq: Every day | ORAL | Status: DC | PRN

## 2022-07-23 MED ORDER — ONDANSETRON HCL 4 MG PO TABS: 4.0000 mg | ORAL_TABLET | Freq: Three times a day (TID) | ORAL | Status: DC | PRN

## 2022-07-23 MED ORDER — FOLIC ACID 1 MG PO TABS
1.0000 mg | ORAL_TABLET | Freq: Every day | ORAL | Status: DC
  Administered 2022-07-23: 1 mg via ORAL
  Filled 2022-07-23: qty 1

## 2022-07-23 MED ORDER — ALBUTEROL SULFATE (2.5 MG/3ML) 0.083% IN NEBU: 2.5000 mg | INHALATION_SOLUTION | RESPIRATORY_TRACT | Status: DC | PRN

## 2022-07-23 MED ORDER — TRAZODONE HCL 50 MG PO TABS
50.0000 mg | ORAL_TABLET | Freq: Every evening | ORAL | Status: DC | PRN
  Administered 2022-07-24: 50 mg via ORAL
  Filled 2022-07-23: qty 1

## 2022-07-23 NOTE — ED Notes (Addendum)
Pt admitted to [facility] for alcohol use. Pt A&O x4, calm and cooperative. Pt denies SI/HI/AVH. Pt tolerated admission process and skin assessment well. Pt ambulated independently to unit. Oriented to unit/staff. Pt declined offer for food; water provided. No signs of acute distress noted. Monitoring for safety.

## 2022-07-23 NOTE — Discharge Instructions (Signed)
Straight to the behavioral health urgent care

## 2022-07-23 NOTE — ED Notes (Signed)
Attempted to call report to [facility] and no answer at this time. Patient is willing and agrees to arrive via [facility] transport to [facility] for rehab.

## 2022-07-23 NOTE — ED Notes (Addendum)
Pt has been dressed out and belongings locked away in locker. (Shirt, pants, shoes, socks, cigarettes, keys and lighter) Security has locked away money and cards upfront. Pt given pillow and warm blankets. Pt is restless at this time. Officer at bedside

## 2022-07-23 NOTE — ED Provider Notes (Signed)
This patient is no longer suicidal, she is awake alert and willing to go to the behavioral health urgent care where she has been accepted.  Rhea Belton the accepting provider  IVC overturned   Eber Hong, MD 07/23/22 2031

## 2022-07-23 NOTE — ED Triage Notes (Addendum)
Pt brought in by [facility] PD after pt called 911 with flight of ideas. Pt states she has anxiety but she hasn't taken her medication. Pt also admits to drinking ETOH tonight. Pt asking to speak with someone about her mind.  Pt also states she also hear a pop in her head on Thursday and had a bad nose bleed but she hasn't been seen for it.

## 2022-07-23 NOTE — ED Notes (Addendum)
Patient is now awake, alert and asking for ginger ale. Patient refusing her medicine at this time. Patient given ginger ale

## 2022-07-23 NOTE — ED Notes (Signed)
Date and time results received: 07/23/22 0703   Test: ETOH Critical Value: 352  Name of Provider Notified: Preston Fleeting, MD

## 2022-07-23 NOTE — ED Provider Notes (Signed)
[facility] EMERGENCY DEPARTMENT Provider Note   CSN: [id number] Arrival date & time: 07/23/22  0413     History  Chief Complaint  Patient presents with   Medical Clearance    Brandy Vega is a 46 y.o. female.  The history is provided by the EMS personnel and the patient. The history is limited by the condition of the patient (Psychiatric disorder).  She has history of bipolar disorder, hypertension, hyperlipidemia is brought in by police because of suicidal ideation.  She had called 911, and when police arrived he states that she felt like something busted in the back of her head and she had a nosebleed and she also stated that she wanted to kill herself.  She did not give a specific method that she was going to use.  She did admit that she had been drinking.  Police report that she intermittently stated that she really did not want to kill herself and just wants to be left alone.  She was noted to be very unsteady on her feet and had fallen.   Home Medications Prior to Admission medications   Medication Sig Start Date End Date Taking? Authorizing Provider  albuterol (VENTOLIN HFA) 108 (90 Base) MCG/ACT inhaler Inhale 2 puffs into the lungs every 4 (four) hours as needed for wheezing or shortness of breath. 05/12/22   Roxy Horseman, PA-C  busPIRone (BUSPAR) 7.5 MG tablet Take 1 tablet (7.5 mg total) by mouth 3 (three) times daily as needed. 11/15/21   Junie Spencer, FNP  cetirizine (ZYRTEC ALLERGY) 10 MG tablet Take 1 tablet (10 mg total) by mouth daily. 02/07/22   [name] A, FNP  escitalopram (LEXAPRO) 20 MG tablet Take 1 tablet (20 mg total) by mouth daily. 02/07/22   Junie Spencer, FNP  fluticasone (FLONASE) 50 MCG/ACT nasal spray Place 2 sprays into both nostrils daily. 08/23/21   Junie Spencer, FNP  hydrOXYzine (ATARAX/VISTARIL) 25 MG tablet Take one to two po q six hours prn anxiety 12/07/20   [provider]  ibuprofen (ADVIL) 800 MG tablet Take 1 tablet  (800 mg total) by mouth every 8 (eight) hours as needed. 06/09/22   [name] A, FNP  Melatonin 10 MG TABS Take 10 mg by mouth daily as needed. 02/07/22   Junie Spencer, FNP      Allergies    Patient has no known allergies.    Review of Systems   Review of Systems  Unable to perform ROS: Psychiatric disorder    Physical Exam Updated Vital Signs BP (!) 141/95 (BP Location: Right Arm)   Pulse (!) 106   Temp 98.7 F (37.1 C) (Oral)   Resp 20   Ht 5\' 6"  (1.676 m)   Wt 52.4 kg   SpO2 93%   BMI 18.65 kg/m  Physical Exam Vitals and nursing note reviewed.   45 year old female, resting comfortably and in no acute distress. Vital signs are significant for mildly elevated heart rate and blood pressure. Oxygen saturation is 93%, which is normal. Head is normocephalic and atraumatic. PERRLA, EOMI. Oropharynx is clear.  Nares are clear without evidence of recent bleeding. Neck is nontender and supple without adenopathy or JVD. Back is nontender and there is no CVA tenderness. Lungs are clear without rales, wheezes, or rhonchi. Chest is nontender. Heart has regular rate and rhythm without murmur. Abdomen is soft, flat, nontender. Extremities have no cyanosis or edema, full range of motion is present. Skin is warm and  dry without rash. Neurologic: Awake and alert but clinically intoxicated.  Cranial nerves are grossly intact.  Moves all extremities equally.  Is generally unsteady on her feet without any specific tendency to fall in one direction.  ED Results / Procedures / Treatments   Labs (all labs ordered are listed, but only abnormal results are displayed) Labs Reviewed  COMPREHENSIVE METABOLIC PANEL - Abnormal; Notable for the following components:      Result Value   CO2 18 (*)    All other components within normal limits  CBC  ETHANOL  RAPID URINE DRUG SCREEN, HOSP PERFORMED  POC URINE PREG, ED   Procedures Procedures    Medications Ordered in ED Medications   ziprasidone (GEODON) injection 10 mg (10 mg Intramuscular Given 07/23/22 0451)    ED Course/ Medical Decision Making/ A&P                           Medical Decision Making Amount and/or Complexity of Data Reviewed Labs: ordered.  Risk Prescription drug management.   Report of suicidal ideation and patient was clinically intoxicated.  She is requesting to be immediately discharged.  However, since she had expressed suicidal thoughts, I did not feel that I could allow her to leave.  I have filled out involuntary commitment papers on her.  I have ordered an injection of ziprasidone.  I have ordered screening labs of CBC, comprehensive metabolic panel, ethanol level, urine drug screen.  I have reviewed her past records, and on 03/18/2018, she was admitted to behavioral health [facility] with depression, alcohol abuse, suicide attempt.  Following ziprasidone injection, patient is sleeping quietly.  I reviewed and interpreted her laboratory tests and my interpretation is normal CBC, mild metabolic acidosis which had been present previously.  Ethanol level was pending.  She will need evaluation by TTS when she is able to cooperate.  Ethanol level has come back significantly elevated at 352.  My interpretation is patient is legally intoxicated.  CRITICAL CARE Performed by: Dione Booze Total critical care time: 35 minutes Critical care time was exclusive of separately billable procedures and treating other patients. Critical care was necessary to treat or prevent imminent or life-threatening deterioration. Critical care was time spent personally by me on the following activities: development of treatment plan with patient and/or surrogate as well as nursing, discussions with consultants, evaluation of patient's response to treatment, examination of patient, obtaining history from patient or surrogate, ordering and performing treatments and interventions, ordering and review of laboratory studies,  ordering and review of radiographic studies, pulse oximetry and re-evaluation of patient's condition.  Final Clinical Impression(s) / ED Diagnoses Final diagnoses:  Suicidal ideation  Alcohol intoxication, uncomplicated (HCC)    Rx / DC Orders ED Discharge Orders     None         Dione Booze, MD 07/23/22 [phone]

## 2022-07-23 NOTE — ED Notes (Signed)
IVC paperwork copies made for transport.

## 2022-07-23 NOTE — Consult Note (Signed)
Telepsych Consultation   Reason for Consult:  psych consult Referring Physician:  Delora Fuel, MD Location of Patient:  [facility] [phone] Location of Provider: [facility]  Patient Identification: Brandy Vega MRN:  [id number] Principal Diagnosis: Substance induced mood disorder ([place]) Diagnosis:  Principal Problem:   Substance induced mood disorder ([place]) Active Problems:   MDD (major depressive disorder), recurrent episode, severe ([place])   GAD (generalized anxiety disorder)   Alcohol use disorder, severe, dependence ([place])   Total Time spent with patient: 30 minutes  Subjective:   Brandy Vega is a 46 y.o. female patient admitted with suicidal ideations in the context of alcohol intoxication. Patient presents asleep in bed. Drowsy. Casually dressed; appears older than stated age. Slow speech. States reason for admission as "the police bought me here. I had a rough day yesterday. A lot of depression I guess". She reports increased depression related to onset of the holidays.  Rates current depression 3-4/10. Endorses increased consumption of alcohol over past 24 hours; approximately 1/5 of alcohol, details drinking more over past 3 days. Currently works full-time at a factory making copper; last worked Tuesday and has been drinking since. Reports consistent increased alcohol use due to holidays and missing deceased relatives. States she had stopped drinking x1 year until COVID and began drinking again. Per chart review patient seen at PCP 06/09/22 for annual physical where she reported continued depression and alcohol abuse.   She is currently denying any active suicidal or homicidal ideations, auditory or visual hallucinations. She remained calm and cooperative throughout the assessment. Is agreeable to treatment, yet concerned about returning to work. Provider discussed Facility Based Crisis treatment to assist with depression and substance abuse. She contracts for safety.    HPI:  Brandy Vega is a 46 year old female with past psychiatric history of major depressive disorder severe, generalized anxiety disorder, and alcohol use disorder who presented to [facility] via law enforcement after calling 911 with reported flight of ideas in the context of alcohol use. Currently lives in [place] with her boyfriend. Currently works full-time at [employer] making copper. UDS-, BAL 352.   Past Psychiatric History: major depressive disorder severe, generalized anxiety disorder, and alcohol use disorder  Risk to Self:  pt denies Risk to Others:  pt denies Prior Inpatient Therapy:  [facility] 2019 Prior Outpatient Therapy:  yes  Past Medical History:  Past Medical History:  Diagnosis Date   Bipolar 1 disorder ([place])    Essential hypertension 09/05/2018   GAD (generalized anxiety disorder) 06/25/2019   Hypertriglyceridemia 07/25/2021   MDD (major depressive disorder), recurrent episode, severe ([place]) 03/18/2018    Past Surgical History:  Procedure Laterality Date   DILATION AND CURETTAGE OF UTERUS     Family History:  Family History  Problem Relation Age of Onset   Hypertension Mother    Diabetes Father    Family Psychiatric History: not noted Social History:  Social History   Substance and Sexual Activity  Alcohol Use Yes   Comment: occ. use     Social History   Substance and Sexual Activity  Drug Use No    Social History   Socioeconomic History   Marital status: Single    Spouse name: Not on file   Number of children: Not on file   Years of education: Not on file   Highest education level: Not on file  Occupational History   Not on file  Tobacco Use   Smoking status: Every Day    Packs/day:  0.50    Types: Cigarettes   Smokeless tobacco: Never  Vaping Use   Vaping Use: Never used  Substance and Sexual Activity   Alcohol use: Yes    Comment: occ. use   Drug use: No   Sexual activity: Yes    Birth control/protection: None  Other Topics Concern   Not on  file  Social History Narrative   Lives with boyfriend.     Social Determinants of Health   Financial Resource Strain: Not on file  Food Insecurity: Not on file  Transportation Needs: Not on file  Physical Activity: Not on file  Stress: Not on file  Social Connections: Not on file   Additional Social History:    Allergies:  No Known Allergies  Labs:  Results for orders placed or performed during the hospital encounter of 07/23/22 (from the past 48 hour(s))  Comprehensive metabolic panel     Status: Abnormal   Collection Time: 07/23/22  4:45 AM  Result Value Ref Range   Sodium 143 135 - 145 mmol/L   Potassium 3.6 3.5 - 5.1 mmol/L   Chloride 110 98 - 111 mmol/L   CO2 18 (L) 22 - 32 mmol/L   Glucose, Bld 75 70 - 99 mg/dL    Comment: Glucose reference range applies only to samples taken after fasting for at least 8 hours.   BUN 9 6 - 20 mg/dL   Creatinine, Ser 9.51 0.44 - 1.00 mg/dL   Calcium 8.9 8.9 - 88.4 mg/dL   Total Protein 8.1 6.5 - 8.1 g/dL   Albumin 4.8 3.5 - 5.0 g/dL   AST 36 15 - 41 U/L   ALT 22 0 - 44 U/L   Alkaline Phosphatase 65 38 - 126 U/L   Total Bilirubin 0.5 0.3 - 1.2 mg/dL   GFR, Estimated >16 >60 mL/min    Comment: (NOTE) Calculated using the CKD-EPI Creatinine Equation (2021)    Anion gap 15 5 - 15    Comment: Performed at [facility], [address], [place], [place] [address]  Ethanol     Status: Abnormal   Collection Time: 07/23/22  4:45 AM  Result Value Ref Range   Alcohol, Ethyl (B) 352 (HH) <10 mg/dL    Comment: CRITICAL RESULT CALLED TO, READ BACK BY AND VERIFIED WITH:  J SAPPELT @ 0703 BY STEPHTR 07/23/22 (NOTE) Lowest detectable limit for serum alcohol is 10 mg/dL.  For medical purposes only. Performed at [facility], [address], [place], [place] [address]   cbc     Status: None   Collection Time: 07/23/22  4:45 AM  Result Value Ref Range   WBC 7.7 4.0 - 10.5 K/uL   RBC 4.74 3.87 - 5.11 MIL/uL   Hemoglobin 13.9 12.0 - 15.0 g/dL    HCT 23.5 57.3 - 22.0 %   MCV 87.8 80.0 - 100.0 fL   MCH 29.3 26.0 - 34.0 pg   MCHC 33.4 30.0 - 36.0 g/dL   RDW 25.4 27.0 - 62.3 %   Platelets 298 150 - 400 K/uL   nRBC 0.0 0.0 - 0.2 %    Comment: Performed at [facility], [address], [place], [place] [address]  Rapid urine drug screen (hospital performed)     Status: None   Collection Time: 07/23/22  1:40 PM  Result Value Ref Range   Opiates NONE DETECTED NONE DETECTED   Cocaine NONE DETECTED NONE DETECTED   Benzodiazepines NONE DETECTED NONE DETECTED   Amphetamines NONE DETECTED  NONE DETECTED   Tetrahydrocannabinol NONE DETECTED NONE DETECTED   Barbiturates NONE DETECTED NONE DETECTED    Comment: (NOTE) DRUG SCREEN FOR MEDICAL PURPOSES ONLY.  IF CONFIRMATION IS NEEDED FOR ANY PURPOSE, NOTIFY LAB WITHIN 5 DAYS.  LOWEST DETECTABLE LIMITS FOR URINE DRUG SCREEN Drug Class                     Cutoff (ng/mL) Amphetamine and metabolites    1000 Barbiturate and metabolites    200 Benzodiazepine                 200 Opiates and metabolites        300 Cocaine and metabolites        300 THC                            50 Performed at [facility], [address], [place], [place] [address]   POC urine preg, ED     Status: None   Collection Time: 07/23/22  1:43 PM  Result Value Ref Range   Preg Test, Ur NEGATIVE NEGATIVE    Comment:        THE SENSITIVITY OF THIS METHODOLOGY IS >24 mIU/mL   Lipid panel     Status: None   Collection Time: 07/23/22  5:00 PM  Result Value Ref Range   Cholesterol 195 0 - 200 mg/dL   Triglycerides 63 <150 mg/dL   HDL 105 >40 mg/dL   Total CHOL/HDL Ratio 1.9 RATIO   VLDL 13 0 - 40 mg/dL   LDL Cholesterol 77 0 - 99 mg/dL    Comment:        Total Cholesterol/HDL:CHD Risk Coronary Heart Disease Risk Table                     Men   Women  1/2 Average Risk   3.4   3.3  Average Risk       5.0   4.4  2 X Average Risk   9.6   7.1  3 X Average Risk  23.4   11.0        Use the calculated Patient  Ratio above and the CHD Risk Table to determine the patient's CHD Risk.        ATP III CLASSIFICATION (LDL):  <100     mg/dL   Optimal  100-129  mg/dL   Near or Above                    Optimal  130-159  mg/dL   Borderline  160-189  mg/dL   High  >190     mg/dL   Very High Performed at [facility], [address] [place], [place] [address]    Medications:  Current Facility-Administered Medications  Medication Dose Route Frequency Provider Last Rate Last Admin   acetaminophen (TYLENOL) tablet 650 mg  650 mg Oral [id number] PRN Delora Fuel, MD       albuterol (PROVENTIL) (2.5 MG/3ML) 0.083% nebulizer solution 2.5 mg  2.5 mg Inhalation Q4H PRN Madueme, Elvira C, RPH       alum & mag hydroxide-simeth (MAALOX/MYLANTA) 200-200-20 MG/5ML suspension 30 mL  30 mL Oral [id number] PRN Delora Fuel, MD       escitalopram (LEXAPRO) tablet 20 mg  20 mg Oral Daily Delora Fuel, MD       folic acid (FOLVITE) tablet 1 mg  1 mg  Oral Daily Leevy-Johnson, Neleh Muldoon A, NP   1 mg at 07/23/22 1644   LORazepam (ATIVAN) tablet 1-4 mg  1-4 mg Oral Q1H PRN Leevy-Johnson, Yacob Wilkerson A, NP       Or   LORazepam (ATIVAN) injection 1-4 mg  1-4 mg Intravenous Q1H PRN Leevy-Johnson, Carolyna Yerian A, NP       LORazepam (ATIVAN) tablet 0-4 mg  0-4 mg Oral Q6H Leevy-Johnson, Tyronn Golda A, NP       Followed by   Derrill Memo ON 07/25/2022] LORazepam (ATIVAN) tablet 0-4 mg  0-4 mg Oral Q12H Leevy-Johnson, Haislee Corso A, NP       multivitamin with minerals tablet 1 tablet  1 tablet Oral Daily Leevy-Johnson, Terrye Dombrosky A, NP   1 tablet at 07/23/22 1644   nicotine (NICODERM CQ - dosed in mg/24 hr) patch 7 mg  7 mg Transdermal Daily Delora Fuel, MD       ondansetron [facility]) tablet 4 mg  4 mg Oral [id number] PRN Delora Fuel, MD       thiamine (VITAMIN B1) tablet 100 mg  100 mg Oral Daily Leevy-Johnson, Dakari Cregger A, NP   100 mg at 07/23/22 1644   Or   thiamine (VITAMIN B1) injection 100 mg  100 mg Intravenous Daily Leevy-Johnson, Onyx Schirmer A, NP       Current Outpatient  Medications  Medication Sig Dispense Refill   albuterol (VENTOLIN HFA) 108 (90 Base) MCG/ACT inhaler Inhale 2 puffs into the lungs every 4 (four) hours as needed for wheezing or shortness of breath. 1 each 0   busPIRone (BUSPAR) 7.5 MG tablet Take 1 tablet (7.5 mg total) by mouth 3 (three) times daily as needed. (Patient taking differently: Take 7.5 mg by mouth 3 (three) times daily as needed (anxiety).) 270 each 1   cetirizine (ZYRTEC ALLERGY) 10 MG tablet Take 1 tablet (10 mg total) by mouth daily. (Patient taking differently: Take 10 mg by mouth in the morning.) 90 tablet 1   escitalopram (LEXAPRO) 20 MG tablet Take 1 tablet (20 mg total) by mouth daily. (Patient taking differently: Take 20 mg by mouth in the morning.) 90 tablet 1   FOLIC ACID PO Take 1 tablet by mouth in the morning. OTC folic acid, unknown strength.     ibuprofen (ADVIL) 800 MG tablet Take 1 tablet (800 mg total) by mouth every 8 (eight) hours as needed. (Patient taking differently: Take 800 mg by mouth every 8 (eight) hours as needed (pain).) 90 tablet 1   Melatonin 10 MG TABS Take 10 mg by mouth daily as needed. (Patient taking differently: Take 10 mg by mouth daily as needed (sleep).) 90 tablet 1   Musculoskeletal: Strength & Muscle Tone: within normal limits Gait & Station: normal Patient leans: N/A  Psychiatric Specialty Exam:  Presentation  General Appearance: Disheveled; Casual  Eye Contact:Fair  Speech:Slow  Speech Volume:Normal  Handedness:Right   Mood and Affect  Mood:Dysphoric  Affect:Blunt   Thought Process  Thought Processes:Coherent  Descriptions of Associations:Intact  Orientation:Partial  Thought Content:Logical  History of Schizophrenia/Schizoaffective disorder:No data recorded Duration of Psychotic Symptoms:No data recorded Hallucinations:Hallucinations: None  Ideas of Reference:None  Suicidal Thoughts:Suicidal Thoughts: No  Homicidal Thoughts:Homicidal Thoughts:  No   Sensorium  Memory:Immediate Fair; Recent Fair  Judgment:Fair  Insight:Fair   Executive Functions  Concentration:Fair  Attention Span:Fair  [place]   Psychomotor Activity  Psychomotor Activity:Psychomotor Activity: Normal   Assets  Assets:Communication Skills; Resilience; Vocational/Educational   Sleep  Sleep:Sleep: Good  Physical Exam: Physical Exam Vitals and nursing note reviewed.  Constitutional:      Appearance: She is toxic-appearing.  HENT:     Head: Normocephalic.     Mouth/Throat:     Pharynx: Oropharynx is clear.  Eyes:     Pupils: Pupils are equal, round, and reactive to light.  Cardiovascular:     Pulses: Normal pulses.  Pulmonary:     Effort: Pulmonary effort is normal.  Musculoskeletal:        General: Normal range of motion.  Skin:    General: Skin is dry.  Neurological:     Mental Status: She is disoriented.  Psychiatric:        Attention and Perception: She does not perceive auditory or visual hallucinations.        Mood and Affect: Mood is depressed. Affect is blunt and flat.        Speech: Speech normal.        Behavior: Behavior is slowed and withdrawn.        Thought Content: Thought content is not paranoid or delusional. Thought content does not include homicidal or suicidal ideation. Thought content does not include homicidal or suicidal plan.        Cognition and Memory: Memory normal.        Judgment: Judgment is impulsive.    Review of Systems  Psychiatric/Behavioral:  Positive for substance abuse.   All other systems reviewed and are negative.  Blood pressure (!) 147/77, pulse 84, temperature 98.2 F (36.8 C), temperature source Oral, resp. rate 16, height 5\' 6"  (1.676 m), weight 52.4 kg, SpO2 99 %. Body mass index is 18.65 kg/m.  Treatment Plan Summary: Daily contact with patient to assess and evaluate symptoms and progress in treatment, Medication management, and  Plan   PLAN:  Alcohol Use Disorder, severe:   -Start:      - CIWA protocol  Major Depressive Disorder, severe without psychotic features  -medication initiation deferred at this time due to current state. Will  re-evaluate in 24 hours  Substance induced mood disorder:   -CIWA protocol  -referred to [facility] for treatment  Disposition: Supportive therapy provided about ongoing stressors. Discussed crisis plan, support from social network, calling 911, coming to the Emergency Department, and calling Suicide Hotline.  This service was provided via telemedicine using a 2-way, interactive audio and video technology.  Names of all persons participating in this telemedicine service and their role in this encounter. Name: Oneida Alar Role: PMHNP  Name: Cloyde Reams Cinderella Role: Attending MD  Name: Brandy Vega Role: patient  Name:  Role:     Inda Merlin, NP 07/23/2022 5:38 PM

## 2022-07-23 NOTE — ED Notes (Signed)
TOC consulted for substance use resources. CSW provided resources to RN in ED to be placed with discharge paperwork. TOC signing off.

## 2022-07-24 DIAGNOSIS — F1020 Alcohol dependence, uncomplicated: Secondary | ICD-10-CM | POA: Diagnosis not present

## 2022-07-24 MED ORDER — ESCITALOPRAM OXALATE 10 MG PO TABS
20.0000 mg | ORAL_TABLET | Freq: Every day | ORAL | Status: DC
  Administered 2022-07-24 – 2022-07-25 (×2): 20 mg via ORAL
  Filled 2022-07-24 (×2): qty 2

## 2022-07-24 MED ORDER — NICOTINE POLACRILEX 2 MG MT GUM: 4.0000 mg | CHEWING_GUM | OROMUCOSAL | Status: DC | PRN

## 2022-07-24 MED ORDER — NICOTINE 14 MG/24HR TD PT24
14.0000 mg | MEDICATED_PATCH | Freq: Every day | TRANSDERMAL | Status: DC
  Filled 2022-07-24: qty 1

## 2022-07-24 MED ORDER — NICOTINE 21 MG/24HR TD PT24
21.0000 mg | MEDICATED_PATCH | Freq: Every day | TRANSDERMAL | Status: DC
  Administered 2022-07-24: 21 mg via TRANSDERMAL
  Filled 2022-07-24: qty 1

## 2022-07-24 MED ORDER — NALTREXONE HCL 50 MG PO TABS
50.0000 mg | ORAL_TABLET | Freq: Every day | ORAL | Status: DC
  Filled 2022-07-24: qty 1

## 2022-07-24 NOTE — ED Notes (Signed)
Pt reports nausea states she thinks it is from her nicotine patch.  Patch removed and provider notified.

## 2022-07-24 NOTE — ED Notes (Signed)
Pt asleep in bed. Respirations even and unlabored. Monitoring for safety. 

## 2022-07-24 NOTE — ED Provider Notes (Signed)
[facility] Based Crisis Behavioral Health Progress Note  Date & Time: 07/24/2022 4:41 PM Name: Brandy Vega Age: 46 y.o.  DOB: 1975-12-27  MRN: [id number]  Diagnosis:  Final diagnoses:  None    Reason for presentation: Alcohol Problem  Brief HPI  Brandy Vega is a 45 y.o. female, with PMH of AUD (no h/o DT or sz), MDD, GAD, inpatient psych admission (last time 2019), no suicide attempt, HTN, HLD, who initially presented to [facility] (07/23/2022) after calling [employer] with concerns of SI in the setting of increasing EtOH use (x3days) and intoxication due to holidays. She was then admitted to [facility] (07/23/2022) after no longer intoxicated, for treatment and detox of EtOH in the setting of worsening depression.   Interval Hx   Patient Narrative:   Patient stated that she feels great, and was glad that she was admitted to [facility].  Reported her sleep to be good.  Denied anxiety or depression.  Stated that she has some recollection of the night she called [employer], stated that she remembered that her anxiety was high due to the holidays and drinking.  Aside from the past few days due to increased EtOH use from worsening mood due to holidays, stated that her depression and anxiety are well-controlled with current dose of Lexapro.  Stated that Lexapro was recently increased about 2 months ago, to good effect.  She denied cravings for EtOH or shakes/tremors or other EtOH withdrawal symptoms.  However after discussing the risks, benefits, and side effects of naltrexone and its role in alcohol use disorder, patient was amenable to starting it.  She denied any other side effects from her current medication.  Requested if she is able to be discharged on 11/28, so she can return to work on 11/29.  She requested a work note as well.  She does not believe she needs residential at this time, stated that she would rather continue to follow her outpatient providers and continue working and be followed up with  [facility].   Suicidal Thoughts: No (Contracted to safety) Homicidal Thoughts: No Hallucinations: None (Denied AVH)  Mood: Euthymic Sleep:Good Appetite: Fair, improving  Review of Systems  Respiratory:  Negative for shortness of breath.   Cardiovascular:  Negative for chest pain.  Gastrointestinal:  Positive for nausea. Negative for abdominal pain, constipation, diarrhea and vomiting.  Neurological:  Negative for dizziness, tremors and headaches.     Past History   Psychiatric History: See H&P  Psychiatric Family History: See H&P  Social History:  See H&P  Past Medical History:  Past Medical History:  Diagnosis Date   Bipolar 1 disorder ([place])    Essential hypertension 09/05/2018   GAD (generalized anxiety disorder) 06/25/2019   Hypertriglyceridemia 07/25/2021   MDD (major depressive disorder), recurrent episode, severe ([place]) 03/18/2018    Past Surgical History:  Procedure Laterality Date   DILATION AND CURETTAGE OF UTERUS     Family History:  Family History  Problem Relation Age of Onset   Hypertension Mother    Diabetes Father    Social History   Substance and Sexual Activity  Alcohol Use Yes   Comment: occ. use    Social History   Substance and Sexual Activity  Drug Use No    Social History   Socioeconomic History   Marital status: Single    Spouse name: Not on file   Number of children: Not on file   Years of education: Not on file   Highest education level: Not on file  Occupational History   Not on file  Tobacco Use   Smoking status: Every Day    Packs/day: 0.50    Types: Cigarettes   Smokeless tobacco: Never  Vaping Use   Vaping Use: Never used  Substance and Sexual Activity   Alcohol use: Yes    Comment: occ. use   Drug use: No   Sexual activity: Yes    Birth control/protection: None  Other Topics Concern   Not on file  Social History Narrative   Lives with boyfriend.     Social Determinants of Health   Financial Resource Strain: Not  on file  Food Insecurity: Not on file  Transportation Needs: Not on file  Physical Activity: Not on file  Stress: Not on file  Social Connections: Not on file   SDOH: SDOH Screenings   Depression (PHQ2-9): Low Risk  (07/24/2022)  Recent Concern: Depression (PHQ2-9) - Medium Risk (07/24/2022)  Tobacco Use: High Risk (07/23/2022)   Additional Social History:   Current Medications   Current Facility-Administered Medications  Medication Dose Route Frequency Provider Last Rate Last Admin   acetaminophen (TYLENOL) tablet 650 mg  650 mg Oral Q6H PRN Ajibola, Ene A, NP       alum & mag hydroxide-simeth (MAALOX/MYLANTA) 200-200-20 MG/5ML suspension 30 mL  30 mL Oral Q4H PRN Ajibola, Ene A, NP       escitalopram (LEXAPRO) tablet 20 mg  20 mg Oral Daily Ajibola, Ene A, NP   20 mg at 07/24/22 5520   hydrOXYzine (ATARAX) tablet 25 mg  25 mg Oral Q6H PRN Ajibola, Ene A, NP       loperamide (IMODIUM) capsule 2-4 mg  2-4 mg Oral PRN Ajibola, Ene A, NP       LORazepam (ATIVAN) tablet 1 mg  1 mg Oral Q6H PRN Ajibola, Ene A, NP       magnesium hydroxide (MILK OF MAGNESIA) suspension 30 mL  30 mL Oral Daily PRN Ajibola, Ene A, NP       multivitamin with minerals tablet 1 tablet  1 tablet Oral Daily Ajibola, Ene A, NP   1 tablet at 07/24/22 0929   naltrexone (DEPADE) tablet 50 mg  50 mg Oral QHS Merrily Brittle, DO       [START ON 07/25/2022] nicotine (NICODERM CQ - dosed in mg/24 hours) patch 14 mg  14 mg Transdermal Daily Merrily Brittle, DO       nicotine polacrilex (NICORETTE) gum 4 mg  4 mg Oral PRN Merrily Brittle, DO       ondansetron (ZOFRAN-ODT) disintegrating tablet 4 mg  4 mg Oral Q6H PRN Ajibola, Ene A, NP       traZODone (DESYREL) tablet 50 mg  50 mg Oral QHS PRN Ajibola, Ene A, NP       Current Outpatient Medications  Medication Sig Dispense Refill   albuterol (VENTOLIN HFA) 108 (90 Base) MCG/ACT inhaler Inhale 2 puffs into the lungs every 4 (four) hours as needed for wheezing or shortness of  breath. 1 each 0   busPIRone (BUSPAR) 7.5 MG tablet Take 1 tablet (7.5 mg total) by mouth 3 (three) times daily as needed. (Patient taking differently: Take 7.5 mg by mouth 3 (three) times daily as needed (anxiety).) 270 each 1   cetirizine (ZYRTEC ALLERGY) 10 MG tablet Take 1 tablet (10 mg total) by mouth daily. (Patient taking differently: Take 10 mg by mouth in the morning.) 90 tablet 1   escitalopram (LEXAPRO) 20 MG tablet Take 1 tablet (  20 mg total) by mouth daily. (Patient taking differently: Take 20 mg by mouth in the morning.) 90 tablet 1   FOLIC ACID PO Take 1 tablet by mouth in the morning. OTC folic acid, unknown strength.     ibuprofen (ADVIL) 800 MG tablet Take 1 tablet (800 mg total) by mouth every 8 (eight) hours as needed. (Patient taking differently: Take 800 mg by mouth every 8 (eight) hours as needed (pain).) 90 tablet 1   Melatonin 10 MG TABS Take 10 mg by mouth daily as needed. (Patient taking differently: Take 10 mg by mouth daily as needed (sleep).) 90 tablet 1    Labs / Images  Lab Results:  Admission on 07/23/2022, Discharged on 07/23/2022  Component Date Value Ref Range Status   Sodium 07/23/2022 143  135 - 145 mmol/L Final   Potassium 07/23/2022 3.6  3.5 - 5.1 mmol/L Final   Chloride 07/23/2022 110  98 - 111 mmol/L Final   CO2 07/23/2022 18 (L)  22 - 32 mmol/L Final   Glucose, Bld 07/23/2022 75  70 - 99 mg/dL Final   Glucose reference range applies only to samples taken after fasting for at least 8 hours.   BUN 07/23/2022 9  6 - 20 mg/dL Final   Creatinine, Ser 07/23/2022 0.66  0.44 - 1.00 mg/dL Final   Calcium 07/23/2022 8.9  8.9 - 10.3 mg/dL Final   Total Protein 07/23/2022 8.1  6.5 - 8.1 g/dL Final   Albumin 07/23/2022 4.8  3.5 - 5.0 g/dL Final   AST 07/23/2022 36  15 - 41 U/L Final   ALT 07/23/2022 22  0 - 44 U/L Final   Alkaline Phosphatase 07/23/2022 65  38 - 126 U/L Final   Total Bilirubin 07/23/2022 0.5  0.3 - 1.2 mg/dL Final   GFR, Estimated 07/23/2022  >60  >60 mL/min Final   Comment: (NOTE) Calculated using the CKD-EPI Creatinine Equation (2021)    Anion gap 07/23/2022 15  5 - 15 Final   Performed at [facility], [address], [place], [place] [address]   Alcohol, Ethyl (B) 07/23/2022 352 (HH)  <10 mg/dL Final   Comment: CRITICAL RESULT CALLED TO, READ BACK BY AND VERIFIED WITH:  J SAPPELT @ 0703 BY STEPHTR 07/23/22 (NOTE) Lowest detectable limit for serum alcohol is 10 mg/dL.  For medical purposes only. Performed at [facility], [address], [place], [place] [address]    WBC 07/23/2022 7.7  4.0 - 10.5 K/uL Final   RBC 07/23/2022 4.74  3.87 - 5.11 MIL/uL Final   Hemoglobin 07/23/2022 13.9  12.0 - 15.0 g/dL Final   HCT 07/23/2022 41.6  36.0 - 46.0 % Final   MCV 07/23/2022 87.8  80.0 - 100.0 fL Final   MCH 07/23/2022 29.3  26.0 - 34.0 pg Final   MCHC 07/23/2022 33.4  30.0 - 36.0 g/dL Final   RDW 07/23/2022 13.9  11.5 - 15.5 % Final   Platelets 07/23/2022 298  150 - 400 K/uL Final   nRBC 07/23/2022 0.0  0.0 - 0.2 % Final   Performed at [facility], [address], [place],  [address]   Opiates 07/23/2022 NONE DETECTED  NONE DETECTED Final   Cocaine 07/23/2022 NONE DETECTED  NONE DETECTED Final   Benzodiazepines 07/23/2022 NONE DETECTED  NONE DETECTED Final   Amphetamines 07/23/2022 NONE DETECTED  NONE DETECTED Final   Tetrahydrocannabinol 07/23/2022 NONE DETECTED  NONE DETECTED Final   Barbiturates 07/23/2022 NONE DETECTED  NONE DETECTED Final  Comment: (NOTE) DRUG SCREEN FOR MEDICAL PURPOSES ONLY.  IF CONFIRMATION IS NEEDED FOR ANY PURPOSE, NOTIFY LAB WITHIN 5 DAYS.  LOWEST DETECTABLE LIMITS FOR URINE DRUG SCREEN Drug Class                     Cutoff (ng/mL) Amphetamine and metabolites    1000 Barbiturate and metabolites    200 Benzodiazepine                 200 Opiates and metabolites        300 Cocaine and metabolites        300 THC                            50 Performed at [facility], [address] [place], [place] [address]    Preg Test, Ur 07/23/2022 NEGATIVE  NEGATIVE Final   Comment:        THE SENSITIVITY OF THIS METHODOLOGY IS >24 mIU/mL    Cholesterol 07/23/2022 195  0 - 200 mg/dL Final   Triglycerides 07/23/2022 63  <150 mg/dL Final   HDL 07/23/2022 105  >40 mg/dL Final   Total CHOL/HDL Ratio 07/23/2022 1.9  RATIO Final   VLDL 07/23/2022 13  0 - 40 mg/dL Final   LDL Cholesterol 07/23/2022 77  0 - 99 mg/dL Final   Comment:        Total Cholesterol/HDL:CHD Risk Coronary Heart Disease Risk Table                     Men   Women  1/2 Average Risk   3.4   3.3  Average Risk       5.0   4.4  2 X Average Risk   9.6   7.1  3 X Average Risk  23.4   11.0        Use the calculated Patient Ratio above and the CHD Risk Table to determine the patient's CHD Risk.        ATP III CLASSIFICATION (LDL):  <100     mg/dL   Optimal  100-129  mg/dL   Near or Above                    Optimal  130-159  mg/dL   Borderline  160-189  mg/dL   High  >190     mg/dL   Very High Performed at [facility], [address], [place], [place] [address]    SARS Coronavirus 2 by RT PCR 07/23/2022 NEGATIVE  NEGATIVE Final   Comment: (NOTE) SARS-CoV-2 target nucleic acids are NOT DETECTED.  The SARS-CoV-2 RNA is generally detectable in upper respiratory specimens during the acute phase of infection. The lowest concentration of SARS-CoV-2 viral copies this assay can detect is 138 copies/mL. A negative result does not preclude SARS-Cov-2 infection and should not be used as the sole basis for treatment or other patient management decisions. A negative result may occur with  improper specimen collection/handling, submission of specimen other than nasopharyngeal swab, presence of viral mutation(s) within the areas targeted by this assay, and inadequate number of viral copies(<138 copies/mL). A negative result must be combined with clinical observations, patient history, and epidemiological information.  The expected result is Negative.  Fact Sheet for Patients:  EntrepreneurPulse.com.au  Fact Sheet for Healthcare Providers:  IncredibleEmployment.be  This test is no  t yet approved or cleared by the [place] and  has been authorized for detection and/or diagnosis of SARS-CoV-2 by FDA under an Emergency Use Authorization (EUA). This EUA will remain  in effect (meaning this test can be used) for the duration of the COVID-19 declaration under Section 564(b)(1) of the Act, 21 U.S.C.section 360bbb-3(b)(1), unless the authorization is terminated  or revoked sooner.       Influenza A by PCR 07/23/2022 NEGATIVE  NEGATIVE Final   Influenza B by PCR 07/23/2022 NEGATIVE  NEGATIVE Final   Comment: (NOTE) The Xpert Xpress SARS-CoV-2/FLU/RSV plus assay is intended as an aid in the diagnosis of influenza from Nasopharyngeal swab specimens and should not be used as a sole basis for treatment. Nasal washings and aspirates are unacceptable for Xpert Xpress SARS-CoV-2/FLU/RSV testing.  Fact Sheet for Patients: EntrepreneurPulse.com.au  Fact Sheet for Healthcare Providers: IncredibleEmployment.be  This test is not yet approved or cleared by the [place] FDA and has been authorized for detection and/or diagnosis of SARS-CoV-2 by FDA under an Emergency Use Authorization (EUA). This EUA will remain in effect (meaning this test can be used) for the duration of the COVID-19 declaration under Section 564(b)(1) of the Act, 21 U.S.C. section 360bbb-3(b)(1), unless the authorization is terminated or revoked.  Performed at [facility], [address], [place], [place] [address]   Appointment on 06/16/2022  Component Date Value Ref Range Status   Glucose 06/16/2022 99  70 - 99 mg/dL Final   BUN 06/16/2022 6  6 - 24 mg/dL Final   Creatinine, Ser 06/16/2022 0.82  0.57 - 1.00 mg/dL Final   eGFR  06/16/2022 90  >59 mL/min/1.73 Final   BUN/Creatinine Ratio 06/16/2022 7 (L)  9 - 23 Final   Sodium 06/16/2022 137  134 - 144 mmol/L Final   Potassium 06/16/2022 4.2  3.5 - 5.2 mmol/L Final   Chloride 06/16/2022 103  96 - 106 mmol/L Final   CO2 06/16/2022 19 (L)  20 - 29 mmol/L Final   Calcium 06/16/2022 9.4  8.7 - 10.2 mg/dL Final   Total Protein 06/16/2022 6.3  6.0 - 8.5 g/dL Final   Albumin 06/16/2022 4.3  3.9 - 4.9 g/dL Final   Globulin, Total 06/16/2022 2.0  1.5 - 4.5 g/dL Final   Albumin/Globulin Ratio 06/16/2022 2.2  1.2 - 2.2 Final   Bilirubin Total 06/16/2022 0.4  0.0 - 1.2 mg/dL Final   Alkaline Phosphatase 06/16/2022 63  44 - 121 IU/L Final   AST 06/16/2022 19  0 - 40 IU/L Final   ALT 06/16/2022 11  0 - 32 IU/L Final  Office Visit on 06/09/2022  Component Date Value Ref Range Status   Glucose 06/09/2022 88  70 - 99 mg/dL Final   BUN 06/09/2022 6  6 - 24 mg/dL Final   Creatinine, Ser 06/09/2022 0.77  0.57 - 1.00 mg/dL Final   eGFR 06/09/2022 97  >59 mL/min/1.73 Final   BUN/Creatinine Ratio 06/09/2022 8 (L)  9 - 23 Final   Sodium 06/09/2022 134  134 - 144 mmol/L Final   Potassium 06/09/2022 5.9 (HH)  3.5 - 5.2 mmol/L Final   Comment:                   Client Requested Flag Specimen received hemolyzed. Value may be increased by hemolysis. Clinical correlation indicated.    Chloride 06/09/2022 99  96 - 106 mmol/L Final   CO2 06/09/2022 20  20 - 29 mmol/L Final   Calcium  06/09/2022 9.6  8.7 - 10.2 mg/dL Final   Total Protein 06/09/2022 7.2  6.0 - 8.5 g/dL Final   Albumin 06/09/2022 4.8  3.9 - 4.9 g/dL Final   Globulin, Total 06/09/2022 2.4  1.5 - 4.5 g/dL Final   Albumin/Globulin Ratio 06/09/2022 2.0  1.2 - 2.2 Final   Bilirubin Total 06/09/2022 0.3  0.0 - 1.2 mg/dL Final   Alkaline Phosphatase 06/09/2022 64  44 - 121 IU/L Final   AST 06/09/2022 27  0 - 40 IU/L Final   ALT 06/09/2022 13  0 - 32 IU/L Final   WBC 06/09/2022 8.0  3.4 - 10.8 x10E3/uL Final   RBC 06/09/2022  4.64  3.77 - 5.28 x10E6/uL Final   Hemoglobin 06/09/2022 13.7  11.1 - 15.9 g/dL Final   Hematocrit 06/09/2022 40.9  34.0 - 46.6 % Final   MCV 06/09/2022 88  79 - 97 fL Final   MCH 06/09/2022 29.5  26.6 - 33.0 pg Final   MCHC 06/09/2022 33.5  31.5 - 35.7 g/dL Final   RDW 06/09/2022 12.8  11.7 - 15.4 % Final   Platelets 06/09/2022 233  150 - 450 x10E3/uL Final   Neutrophils 06/09/2022 70  Not Estab. % Final   Lymphs 06/09/2022 20  Not Estab. % Final   Monocytes 06/09/2022 7  Not Estab. % Final   Eos 06/09/2022 2  Not Estab. % Final   Basos 06/09/2022 1  Not Estab. % Final   Neutrophils Absolute 06/09/2022 5.6  1.4 - 7.0 x10E3/uL Final   Lymphocytes Absolute 06/09/2022 1.6  0.7 - 3.1 x10E3/uL Final   Monocytes Absolute 06/09/2022 0.6  0.1 - 0.9 x10E3/uL Final   EOS (ABSOLUTE) 06/09/2022 0.2  0.0 - 0.4 x10E3/uL Final   Basophils Absolute 06/09/2022 0.1  0.0 - 0.2 x10E3/uL Final   Immature Granulocytes 06/09/2022 0  Not Estab. % Final   Immature Grans (Abs) 06/09/2022 0.0  0.0 - 0.1 x10E3/uL Final   Cholesterol, Total 06/09/2022 162  100 - 199 mg/dL Final   Triglycerides 06/09/2022 65  0 - 149 mg/dL Final   HDL 06/09/2022 89  >39 mg/dL Final   VLDL Cholesterol Cal 06/09/2022 13  5 - 40 mg/dL Final   LDL Chol Calc (NIH) 06/09/2022 60  0 - 99 mg/dL Final   Chol/HDL Ratio 06/09/2022 1.8  0.0 - 4.4 ratio Final   Comment:                                   T. Chol/HDL Ratio                                             Men  Women                               1/2 Avg.Risk  3.4    3.3                                   Avg.Risk  5.0    4.4  2X Avg.Risk  9.6    7.1                                3X Avg.Risk 23.4   11.0    TSH 06/09/2022 0.596  0.450 - 4.500 uIU/mL Final  Office Visit on 02/07/2022  Component Date Value Ref Range Status   Total Iron Binding Capacity 02/07/2022 336  250 - 450 ug/dL Final   UIBC 02/07/2022 171  131 - 425 ug/dL Final   Iron 02/07/2022  165 (H)  27 - 159 ug/dL Final   Iron Saturation 02/07/2022 49  15 - 55 % Final   Ferritin 02/07/2022 103  15 - 150 ng/mL Final   Vitamin B-12 02/07/2022 502  232 - 1,245 pg/mL Final   Folate 02/07/2022 4.6  >3.0 ng/mL Final   Comment: A serum folate concentration of less than 3.1 ng/mL is considered to represent clinical deficiency.    WBC 02/07/2022 9.7  3.4 - 10.8 x10E3/uL Final   RBC 02/07/2022 4.77  3.77 - 5.28 x10E6/uL Final   Hemoglobin 02/07/2022 14.2  11.1 - 15.9 g/dL Final   Hematocrit 02/07/2022 41.0  34.0 - 46.6 % Final   MCV 02/07/2022 86  79 - 97 fL Final   MCH 02/07/2022 29.8  26.6 - 33.0 pg Final   MCHC 02/07/2022 34.6  31.5 - 35.7 g/dL Final   RDW 02/07/2022 13.2  11.7 - 15.4 % Final   Platelets 02/07/2022 195  150 - 450 x10E3/uL Final   Neutrophils 02/07/2022 76  Not Estab. % Final   Lymphs 02/07/2022 16  Not Estab. % Final   Monocytes 02/07/2022 6  Not Estab. % Final   Eos 02/07/2022 1  Not Estab. % Final   Basos 02/07/2022 1  Not Estab. % Final   Neutrophils Absolute 02/07/2022 7.4 (H)  1.4 - 7.0 x10E3/uL Final   Lymphocytes Absolute 02/07/2022 1.6  0.7 - 3.1 x10E3/uL Final   Monocytes Absolute 02/07/2022 0.6  0.1 - 0.9 x10E3/uL Final   EOS (ABSOLUTE) 02/07/2022 0.1  0.0 - 0.4 x10E3/uL Final   Basophils Absolute 02/07/2022 0.1  0.0 - 0.2 x10E3/uL Final   Immature Granulocytes 02/07/2022 0  Not Estab. % Final   Immature Grans (Abs) 02/07/2022 0.0  0.0 - 0.1 x10E3/uL Final   Retic Ct Pct 02/07/2022 1.0  0.6 - 2.6 % Final   Glucose 02/07/2022 138 (H)  70 - 99 mg/dL Final   BUN 02/07/2022 11  6 - 24 mg/dL Final   Creatinine, Ser 02/07/2022 0.73  0.57 - 1.00 mg/dL Final   eGFR 02/07/2022 104  >59 mL/min/1.73 Final   BUN/Creatinine Ratio 02/07/2022 15  9 - 23 Final   Sodium 02/07/2022 134  134 - 144 mmol/L Final   Potassium 02/07/2022 3.8  3.5 - 5.2 mmol/L Final   Chloride 02/07/2022 96  96 - 106 mmol/L Final   CO2 02/07/2022 19 (L)  20 - 29 mmol/L Final   Calcium  02/07/2022 9.7  8.7 - 10.2 mg/dL Final   Total Protein 02/07/2022 7.4  6.0 - 8.5 g/dL Final   Albumin 02/07/2022 4.7  3.8 - 4.8 g/dL Final   Globulin, Total 02/07/2022 2.7  1.5 - 4.5 g/dL Final   Albumin/Globulin Ratio 02/07/2022 1.7  1.2 - 2.2 Final   Bilirubin Total 02/07/2022 1.2  0.0 - 1.2 mg/dL Final   Alkaline Phosphatase 02/07/2022 82  44 - 121 IU/L Final  AST 02/07/2022 33  0 - 40 IU/L Final   ALT 02/07/2022 18  0 - 32 IU/L Final   Blood Alcohol level:  Lab Results  Component Value Date   ETH 352 (HH) 07/23/2022   ETH 263 (H) 30/02/6225   Metabolic Disorder Labs: No results found for: "HGBA1C", "MPG" No results found for: "PROLACTIN" Lab Results  Component Value Date   CHOL 195 07/23/2022   TRIG 63 07/23/2022   HDL 105 07/23/2022   CHOLHDL 1.9 07/23/2022   VLDL 13 07/23/2022   LDLCALC 77 07/23/2022   LDLCALC 60 06/09/2022   Therapeutic Lab Levels: No results found for: "LITHIUM" No results found for: "VALPROATE" No results found for: "CBMZ" Physical Findings   AIMS    Flowsheet Row Admission (Discharged) from 03/18/2018 in [facility]  AIMS Total Score 0      GAD-7    [place] Office Visit from 06/09/2022 in [place] Office Visit from 02/07/2022 in [facility] Office Visit from 11/15/2021 in [facility] Office Visit from 08/23/2021 in [facility] Office Visit from 05/31/2021 in [place]  Total GAD-7 Score _0 PHQ2-9    [facility] ED from 07/23/2022 in [facility] Office Visit from 06/09/2022 in [place] Office Visit from 02/07/2022 in [place] Office Visit from 11/15/2021 in [facility] Visit from 08/23/2021 in [place]  PHQ-2 Total Score _1 PHQ-9 Total Score _2 Flowsheet Row ED from 07/23/2022 in [facility] Most recent reading at 07/23/2022 11:51 PM ED from 07/23/2022 in [place] Most recent reading at 07/23/2022  4:24 AM Telephone from 04/30/2018 in [facility] Most recent reading at 04/30/2018  5:26 PM  C-SSRS RISK CATEGORY Low Risk No Risk No Risk       Musculoskeletal  Strength & Muscle Tone: within normal limits Gait & Station: normal Patient leans: N/A   Psychiatric Specialty Exam   Presentation  General Appearance:Appropriate for Environment, Casual, Fairly Groomed Eye Contact:Good Speech:Clear and Coherent, Normal Rate Volume:Normal Handedness:Right  Mood and Affect  Mood:Euthymic Affect:Appropriate, Congruent, Full Range  Thought Process  Thought Process:Coherent, Goal Directed, Linear Descriptions of Associations:Intact  Thought Content Suicidal Thoughts:Suicidal Thoughts: No (Contracted to safety) Homicidal Thoughts:Homicidal Thoughts: No Hallucinations:Hallucinations: None (Denied AVH) Ideas of Reference:None Thought Content:Logical  Sensorium  Memory:Immediate Good Judgment:Fair Insight:Fair  Executive Functions  Orientation:Full (Time, Place and Person) Language:Good Concentration:Good [place] of Knowledge:Good  Psychomotor Activity  Psychomotor Activity:Psychomotor Activity: Normal  Assets  Assets:Communication Skills, Desire for Improvement, Housing, Physical Health, Social Support, Resilience, Financial Resources/Insurance  Sleep  Quality:Good  Physical Exam  BP (!) 176/98   Pulse 66   Temp 98.8 F (37.1 C) (Oral)   Resp 18   SpO2 100%  Physical Exam Vitals and nursing note reviewed.  Constitutional:      General: She is awake. She is not in acute distress.    Appearance: She is not ill-appearing or diaphoretic.  HENT:     Head: Normocephalic.   Pulmonary:     Effort: Pulmonary effort is normal. No respiratory distress.  Neurological:     Mental Status: She is alert.      Assessment / Plan  Total Time spent with patient: 30 minutes Treatment Plan Summary: Daily contact with patient to assess and evaluate symptoms and progress in treatment and Medication management  Principal Problem:   Substance induced mood disorder (HCC)   Neelah Mannings is a 46 y.o. female with PMH of AUD (no h/o DT or sz), MDD, GAD, inpatient psych admission (last time 2019), no suicide attempt, HTN, HLD, who initially presented to [facility] (07/23/2022) after calling [facility] with concerns of SI in the setting of increasing EtOH use (x3days) and intoxication due to holidays. She was then admitted to [facility] (07/23/2022) after no longer intoxicated, for treatment and detox of EtOH in the setting of worsening depression.  Total duration of encounter: 1 day  AUD, severe Last drink was 11/26. Denied h/o seizures and DT. Denied opiate use, and amenable to starting naltrexone after discussing risk, benefits, side effects. Denied ever being on naltrexone in the past to her knowledge.  BAL 352, AST/ALT wnl. CIWA 4,3,0 Reported no tremors or shakes or other w/d sxs. CIWA with ativan PRN per protocol with thiamine & MV supplement Starting naltrexone 38m qHS  MDD  GAD Reported adherence to meds, lexapro was increased 2 months ago to good effects. Her SI prior to [facility], appeared to be due to EtOH intoxication after increased use over past 3 days, due to the holidays, which patient did not give further information on. Aside from worsening mood over thanksgiving, her depression and anxiety is reportedly well managed with current lexapro dose.  Continued home lexapro 20 mg daily  Tobacco use d/o Decreased nicotine patch from 21 mg to 159m due to side effects of nausea per RN  Considerations for follow-up: Recommend high risk screening every 6-12 months with HIV, RPR, hepatitis  panel Recommend monitoring LFT every 6-12 months while on naltrexone  DISPO: Tentative date: 11/28 around 3-4pm possibly Location: Home to boyfriend with SIOP  [place] full time on night shift at a [employer]   Signed: JuMerrily BrittleDO Psychiatry Resident, PGY-2 07/24/2022, 4:41 PM   [facility] [id number]: [phone] Fax: [phone]

## 2022-07-24 NOTE — ED Notes (Signed)
Pt resting quietly, breathing is even and unlabored.  Pt denies SI, HI, pain and AVH.  Will continue to monitor for safety.  

## 2022-07-24 NOTE — Tx Team (Signed)
LCSW met with patient to assess current mood, affect, physical state, and inquire about needs/goals while here in [facility] and after discharge. Patient reports she presented due to having a panic attack on Saturday. Patient reports "getting drunk and overwhelmed" that 911 had to be called. Patient reports spending a few days at [facility] before being brought to the [facility]. Patient reports she is here because she needs to detox from alcohol. Patient reports she has been struggling with alcoholism all of her life, however reports a period of sobriety back in 2019 where she completed a 28 day program at [facility]. Patient reports she relapsed due to the pandemic and being closed in due to covid-19. Patient reports every day use of beer and hard liquor. Patient reports she drinks a 12 pack of airplane bottle E&J on her days off. Patient reports she works a 2-2-3 schedule with 12 hour shifts. Patient reports she is employed at [employer] and has been working there for about 10 years. Patient reports she lives at home with her boyfriend who is supportive. Patient reports she does not drive, however has access via BF to get around. Patient reports her current goal is to be linked to outpatient resources for alcohol use. Patient reports being resourceful stating she plans to attend the local AA meetings that are near her home once she is stable for discharge. Patient reports an interest in being connected to [facility] in [place] for services. Patient reports she does not feel like she needs residential treatment at this time, as she would like to continue working. Patient currently denies any SI/HI/AVH and reports mood as good. Patient aware that LCSW will follow up with [facility] in [place] regarding outpatient services and provide updates as received. Patient expressed understanding and appreciation of LCSW assistance. No other needs were reported at this time by patient.    LCSW contacted  [facility] in [place], [place]. Appointment has been scheduled for the patient for 11/30 at 9:00am for an intake assessment. Patient previously received services in 2019 and will need to complete initial assessment. Patient to be provided an update. Resources will provided in AVS.    Lucius Conn, LCSW Clinical Social Worker [facility] Ph: [phone]

## 2022-07-24 NOTE — ED Provider Notes (Signed)
Facility Based Crisis Admission H&P  Date: 07/24/22 Patient Name: Brandy Vega MRN: [id number] Chief Complaint:  Chief Complaint  Patient presents with   Alcohol Problem      Diagnoses:  Final diagnoses:  None    HPI: Brandy Vega is a 46 year old female with psychiatric history significant for anxiety, depression, substance-induced mood disorder, and alcohol use disorder.  Patient was brought voluntarily to [facility] by law enforcement reporting increased depressive symptoms and alcohol consumption.  Patient was evaluated and recommended for admission to [facility] for stabilization.  This nurse practitioner reviewed patient's chart, met with patient face-to-face, and consulted with Dr. Lovette Cliche.  On evaluation, patient is calm and cooperative, alert and oriented x 4.  Her speech is clear and coherent.  She has good eye contact.  Patient's thought process is coherent and relevant.  She did not appear to be responding to any internal/external stimuli or experiencing any delusional thought content during this assessment.  Patient reports that she has been experiencing worsening depressive symptoms especially during the holiday. She endorses drinking excessive amount of alcohol to cope with depressive symptoms. Patient's BAL on admission was 349m/dl.  She denies history of alcohol withdrawal seizures or DTs.  She says that she experiences hand tremors, sweats, chills, and irritability during period of alcohol detox.   she endorses depressive symptoms of hopelessness, isolation, restlessness, irritability, poor sleep, and fatigue. She denies suicidal ideation, history of suicidal attempt, homicidal ideation, paranoia, hallucinations and other substance abuse except for alcohol.   PHQ 2-9:  [place]ED [facility] from 06/09/2022 in WGearhartVisit from 02/07/2022 in WDe[place]  Thoughts that you would be better off dead, or of hurting yourself in some way Not at all Not at all Not at all  PHQ-9 Total Score _0 Flowsheet Row [facility]/23/2022 [facility] recent reading at 07/23/2022 11:51 PM ED from 07/23/2022 in ANicholsonMost recent reading at 07/23/2022  4:24 AM Telephone from 04/30/2018 in WAlbionMost recent reading at 04/30/2018  5:26 PM  C-SSRS RISK CATEGORY Low Risk No Risk No Risk        Total Time spent with patient: 20 minutes  Musculoskeletal  Strength & Muscle Tone: within normal limits Gait & Station: normal Patient leans: Right  Psychiatric Specialty Exam  Presentation General Appearance:  Casual  Eye Contact: Good  Speech: Clear and Coherent  Speech Volume: Normal  Handedness: Right   Mood and Affect  Mood: Euthymic  Affect: Congruent   Thought Process  Thought Processes: Coherent  Descriptions of Associations:Intact  Orientation:Full (Time, Place and Person)  Thought Content:Logical    Hallucinations:Hallucinations: None  Ideas of Reference:None  Suicidal Thoughts:Suicidal Thoughts: No  Homicidal Thoughts:Homicidal Thoughts: No   Sensorium  Memory: Immediate Fair; Recent Fair; Remote Fair  Judgment: Fair  Insight: Fair   EMaterials engineer Fair  Attention Span: Fair  Recall: FAES Corporationof Knowledge: Fair  Language: Fair   Psychomotor Activity  Psychomotor Activity: Psychomotor Activity: Normal   Assets  Assets: Communication Skills; Desire for Improvement; Physical Health; Resilience   Sleep  Sleep: Sleep: Fair Number of Hours of Sleep: 7   Nutritional Assessment (For OBS and FBC admissions only) Has the patient had a weight loss or gain of 10 pounds or more in the last 3  months?: No Has the patient had a decrease in food intake/or appetite?: No Does the  patient have dental problems?: No Does the patient have eating habits or behaviors that may be indicators of an eating disorder including binging or inducing vomiting?: No Has the patient recently lost weight without trying?: 0 Has the patient been eating poorly because of a decreased appetite?: 0 Malnutrition Screening Tool Score: 0    Physical Exam Vitals and nursing note reviewed.  Constitutional:      General: She is not in acute distress.    Appearance: She is well-developed. She is not ill-appearing or diaphoretic.  HENT:     Head: Normocephalic and atraumatic.  Eyes:     Conjunctiva/sclera: Conjunctivae normal.  Cardiovascular:     Rate and Rhythm: Normal rate.     Heart sounds: No murmur heard. Pulmonary:     Effort: Pulmonary effort is normal. No respiratory distress.  Abdominal:     Palpations: Abdomen is soft.     Tenderness: There is no abdominal tenderness.  Musculoskeletal:        General: Swelling present. Normal range of motion.     Cervical back: Normal range of motion.  Skin:    General: Skin is warm and dry.     Capillary Refill: Capillary refill takes 2 to 3 seconds.  Neurological:     Mental Status: She is alert.  Psychiatric:        Attention and Perception: Attention and perception normal.        Mood and Affect: Mood is depressed.        Speech: Speech normal.        Behavior: Behavior normal. Behavior is cooperative.        Thought Content: Thought content is not paranoid or delusional. Thought content does not include homicidal or suicidal ideation. Thought content does not include homicidal or suicidal plan.    Review of Systems  Constitutional: Negative.   HENT: Negative.    Eyes: Negative.   Respiratory: Negative.    Cardiovascular: Negative.   Gastrointestinal: Negative.   Genitourinary: Negative.   Musculoskeletal: Negative.   Skin: Negative.   Neurological: Negative.   Endo/Heme/Allergies: Negative.   Psychiatric/Behavioral:   Positive for depression and substance abuse. The patient is nervous/anxious.     Blood pressure (!) 157/105, pulse 87, temperature 98.7 F (37.1 C), temperature source Oral, resp. rate 18, SpO2 97 %. There is no height or weight on file to calculate BMI.  Past Psychiatric History:  anxiety, depression, substance-induced mood disorder, and alcohol use disorder.  Is the patient at risk to self? No  Has the patient been a risk to self in the past 6 months? No .    Has the patient been a risk to self within the distant past? No   Is the patient a risk to others? No   Has the patient been a risk to others in the past 6 months? No   Has the patient been a risk to others within the distant past? No   Past Medical History:  Past Medical History:  Diagnosis Date   Bipolar 1 disorder ([place])    Essential hypertension 09/05/2018   GAD (generalized anxiety disorder) 06/25/2019   Hypertriglyceridemia 07/25/2021   MDD (major depressive disorder), recurrent episode, severe ([place]) 03/18/2018    Past Surgical History:  Procedure Laterality Date   DILATION AND CURETTAGE OF UTERUS      Family History:  Family History  Problem Relation Age of  Onset   Hypertension Mother    Diabetes Father     Social History:  Social History   Socioeconomic History   Marital status: Single    Spouse name: Not on file   Number of children: Not on file   Years of education: Not on file   Highest education level: Not on file  Occupational History   Not on file  Tobacco Use   Smoking status: Every Day    Packs/day: 0.50    Types: Cigarettes   Smokeless tobacco: Never  Vaping Use   Vaping Use: Never used  Substance and Sexual Activity   Alcohol use: Yes    Comment: occ. use   Drug use: No   Sexual activity: Yes    Birth control/protection: None  Other Topics Concern   Not on file  Social History Narrative   Lives with boyfriend.     Social Determinants of Health   Financial Resource Strain: Not on  file  Food Insecurity: Not on file  Transportation Needs: Not on file  Physical Activity: Not on file  Stress: Not on file  Social Connections: Not on file  Intimate Partner Violence: Not on file    SDOH:  SDOH Screenings   Depression (PHQ2-9): Medium Risk (07/24/2022)  Tobacco Use: High Risk (07/23/2022)    Last Labs:  Admission on 07/23/2022, Discharged on 07/23/2022  Component Date Value Ref Range Status   Sodium 07/23/2022 143  135 - 145 mmol/L Final   Potassium 07/23/2022 3.6  3.5 - 5.1 mmol/L Final   Chloride 07/23/2022 110  98 - 111 mmol/L Final   CO2 07/23/2022 18 (L)  22 - 32 mmol/L Final   Glucose, Bld 07/23/2022 75  70 - 99 mg/dL Final   Glucose reference range applies only to samples taken after fasting for at least 8 hours.   BUN 07/23/2022 9  6 - 20 mg/dL Final   Creatinine, Ser 07/23/2022 0.66  0.44 - 1.00 mg/dL Final   Calcium 07/23/2022 8.9  8.9 - 10.3 mg/dL Final   Total Protein 07/23/2022 8.1  6.5 - 8.1 g/dL Final   Albumin 07/23/2022 4.8  3.5 - 5.0 g/dL Final   AST 07/23/2022 36  15 - 41 U/L Final   ALT 07/23/2022 22  0 - 44 U/L Final   Alkaline Phosphatase 07/23/2022 65  38 - 126 U/L Final   Total Bilirubin 07/23/2022 0.5  0.3 - 1.2 mg/dL Final   GFR, Estimated 07/23/2022 >60  >60 mL/min Final   Comment: (NOTE) Calculated using the CKD-EPI Creatinine Equation (2021)    Anion gap 07/23/2022 15  5 - 15 Final   Performed at [facility], [address] [place], [place] [address]   Alcohol, Ethyl (B) 07/23/2022 352 (HH)  <10 mg/dL Final   Comment: CRITICAL RESULT CALLED TO, READ BACK BY AND VERIFIED WITH:  J SAPPELT @ 0703 BY STEPHTR 07/23/22 (NOTE) Lowest detectable limit for serum alcohol is 10 mg/dL.  For medical purposes only. Performed at [facility], [address] [place], [place] [address]    WBC 07/23/2022 7.7  4.0 - 10.5 K/uL Final   RBC 07/23/2022 4.74  3.87 - 5.11 MIL/uL Final   Hemoglobin 07/23/2022 13.9  12.0 - 15.0 g/dL Final   HCT  07/23/2022 41.6  36.0 - 46.0 % Final   MCV 07/23/2022 87.8  80.0 - 100.0 fL Final   MCH 07/23/2022 29.3  26.0 - 34.0 pg Final   MCHC 07/23/2022 33.4  30.0 -  36.0 g/dL Final   RDW 07/23/2022 13.9  11.5 - 15.5 % Final   Platelets 07/23/2022 298  150 - 400 K/uL Final   nRBC 07/23/2022 0.0  0.0 - 0.2 % Final   Performed at [facility], [address], [place], [place] [address]   Opiates 07/23/2022 NONE DETECTED  NONE DETECTED Final   Cocaine 07/23/2022 NONE DETECTED  NONE DETECTED Final   Benzodiazepines 07/23/2022 NONE DETECTED  NONE DETECTED Final   Amphetamines 07/23/2022 NONE DETECTED  NONE DETECTED Final   Tetrahydrocannabinol 07/23/2022 NONE DETECTED  NONE DETECTED Final   Barbiturates 07/23/2022 NONE DETECTED  NONE DETECTED Final   Comment: (NOTE) DRUG SCREEN FOR MEDICAL PURPOSES ONLY.  IF CONFIRMATION IS NEEDED FOR ANY PURPOSE, NOTIFY LAB WITHIN 5 DAYS.  LOWEST DETECTABLE LIMITS FOR URINE DRUG SCREEN Drug Class                     Cutoff (ng/mL) Amphetamine and metabolites    1000 Barbiturate and metabolites    200 Benzodiazepine                 200 Opiates and metabolites        300 Cocaine and metabolites        300 THC                            50 Performed at [facility], [address] [place], [place] [address]    Preg Test, Ur 07/23/2022 NEGATIVE  NEGATIVE Final   Comment:        THE SENSITIVITY OF THIS METHODOLOGY IS >24 mIU/mL    Cholesterol 07/23/2022 195  0 - 200 mg/dL Final   Triglycerides 07/23/2022 63  <150 mg/dL Final   HDL 07/23/2022 105  >40 mg/dL Final   Total CHOL/HDL Ratio 07/23/2022 1.9  RATIO Final   VLDL 07/23/2022 13  0 - 40 mg/dL Final   LDL Cholesterol 07/23/2022 77  0 - 99 mg/dL Final   Comment:        Total Cholesterol/HDL:CHD Risk Coronary Heart Disease Risk Table                     Men   Women  1/2 Average Risk   3.4   3.3  Average Risk       5.0   4.4  2 X Average Risk   9.6   7.1  3 X Average Risk  23.4   11.0        Use the  calculated Patient Ratio above and the CHD Risk Table to determine the patient's CHD Risk.        ATP III CLASSIFICATION (LDL):  <100     mg/dL   Optimal  100-129  mg/dL   Near or Above                    Optimal  130-159  mg/dL   Borderline  160-189  mg/dL   High  >190     mg/dL   Very High Performed at [facility], [address], [place],  [address]    SARS Coronavirus 2 by RT PCR 07/23/2022 NEGATIVE  NEGATIVE Final   Comment: (NOTE) SARS-CoV-2 target nucleic acids are NOT DETECTED.  The SARS-CoV-2 RNA is generally detectable in upper respiratory specimens during the acute phase of infection. The lowest concentration of SARS-CoV-2 viral copies this assay can detect  is 138 copies/mL. A negative result does not preclude SARS-Cov-2 infection and should not be used as the sole basis for treatment or other patient management decisions. A negative result may occur with  improper specimen collection/handling, submission of specimen other than nasopharyngeal swab, presence of viral mutation(s) within the areas targeted by this assay, and inadequate number of viral copies(<138 copies/mL). A negative result must be combined with clinical observations, patient history, and epidemiological information. The expected result is Negative.  Fact Sheet for Patients:  EntrepreneurPulse.com.au  Fact Sheet for Healthcare Providers:  IncredibleEmployment.be  This test is no                          t yet approved or cleared by the [place] FDA and  has been authorized for detection and/or diagnosis of SARS-CoV-2 by FDA under an Emergency Use Authorization (EUA). This EUA will remain  in effect (meaning this test can be used) for the duration of the COVID-19 declaration under Section 564(b)(1) of the Act, 21 U.S.C.section 360bbb-3(b)(1), unless the authorization is terminated  or revoked sooner.       Influenza A by PCR 07/23/2022 NEGATIVE   NEGATIVE Final   Influenza B by PCR 07/23/2022 NEGATIVE  NEGATIVE Final   Comment: (NOTE) The Xpert Xpress SARS-CoV-2/FLU/RSV plus assay is intended as an aid in the diagnosis of influenza from Nasopharyngeal swab specimens and should not be used as a sole basis for treatment. Nasal washings and aspirates are unacceptable for Xpert Xpress SARS-CoV-2/FLU/RSV testing.  Fact Sheet for Patients: EntrepreneurPulse.com.au  Fact Sheet for Healthcare Providers: IncredibleEmployment.be  This test is not yet approved or cleared by the [place] FDA and has been authorized for detection and/or diagnosis of SARS-CoV-2 by FDA under an Emergency Use Authorization (EUA). This EUA will remain in effect (meaning this test can be used) for the duration of the COVID-19 declaration under Section 564(b)(1) of the Act, 21 U.S.C. section 360bbb-3(b)(1), unless the authorization is terminated or revoked.  Performed at [facility], [address] [place], [place] [address]   Appointment on 06/16/2022  Component Date Value Ref Range Status   Glucose 06/16/2022 99  70 - 99 mg/dL Final   BUN 06/16/2022 6  6 - 24 mg/dL Final   Creatinine, Ser 06/16/2022 0.82  0.57 - 1.00 mg/dL Final   eGFR 06/16/2022 90  >59 mL/min/1.73 Final   BUN/Creatinine Ratio 06/16/2022 7 (L)  9 - 23 Final   Sodium 06/16/2022 137  134 - 144 mmol/L Final   Potassium 06/16/2022 4.2  3.5 - 5.2 mmol/L Final   Chloride 06/16/2022 103  96 - 106 mmol/L Final   CO2 06/16/2022 19 (L)  20 - 29 mmol/L Final   Calcium 06/16/2022 9.4  8.7 - 10.2 mg/dL Final   Total Protein 06/16/2022 6.3  6.0 - 8.5 g/dL Final   Albumin 06/16/2022 4.3  3.9 - 4.9 g/dL Final   Globulin, Total 06/16/2022 2.0  1.5 - 4.5 g/dL Final   Albumin/Globulin Ratio 06/16/2022 2.2  1.2 - 2.2 Final   Bilirubin Total 06/16/2022 0.4  0.0 - 1.2 mg/dL Final   Alkaline Phosphatase 06/16/2022 63  44 - 121 IU/L Final   AST 06/16/2022 19  0 -  40 IU/L Final   ALT 06/16/2022 11  0 - 32 IU/L Final  Office Visit on 06/09/2022  Component Date Value Ref Range Status   Glucose 06/09/2022 88  70 - 99 mg/dL Final  BUN 06/09/2022 6  6 - 24 mg/dL Final   Creatinine, Ser 06/09/2022 0.77  0.57 - 1.00 mg/dL Final   eGFR 06/09/2022 97  >59 mL/min/1.73 Final   BUN/Creatinine Ratio 06/09/2022 8 (L)  9 - 23 Final   Sodium 06/09/2022 134  134 - 144 mmol/L Final   Potassium 06/09/2022 5.9 (HH)  3.5 - 5.2 mmol/L Final   Comment:                   Client Requested Flag Specimen received hemolyzed. Value may be increased by hemolysis. Clinical correlation indicated.    Chloride 06/09/2022 99  96 - 106 mmol/L Final   CO2 06/09/2022 20  20 - 29 mmol/L Final   Calcium 06/09/2022 9.6  8.7 - 10.2 mg/dL Final   Total Protein 06/09/2022 7.2  6.0 - 8.5 g/dL Final   Albumin 06/09/2022 4.8  3.9 - 4.9 g/dL Final   Globulin, Total 06/09/2022 2.4  1.5 - 4.5 g/dL Final   Albumin/Globulin Ratio 06/09/2022 2.0  1.2 - 2.2 Final   Bilirubin Total 06/09/2022 0.3  0.0 - 1.2 mg/dL Final   Alkaline Phosphatase 06/09/2022 64  44 - 121 IU/L Final   AST 06/09/2022 27  0 - 40 IU/L Final   ALT 06/09/2022 13  0 - 32 IU/L Final   WBC 06/09/2022 8.0  3.4 - 10.8 x10E3/uL Final   RBC 06/09/2022 4.64  3.77 - 5.28 x10E6/uL Final   Hemoglobin 06/09/2022 13.7  11.1 - 15.9 g/dL Final   Hematocrit 06/09/2022 40.9  34.0 - 46.6 % Final   MCV 06/09/2022 88  79 - 97 fL Final   MCH 06/09/2022 29.5  26.6 - 33.0 pg Final   MCHC 06/09/2022 33.5  31.5 - 35.7 g/dL Final   RDW 06/09/2022 12.8  11.7 - 15.4 % Final   Platelets 06/09/2022 233  150 - 450 x10E3/uL Final   Neutrophils 06/09/2022 70  Not Estab. % Final   Lymphs 06/09/2022 20  Not Estab. % Final   Monocytes 06/09/2022 7  Not Estab. % Final   Eos 06/09/2022 2  Not Estab. % Final   Basos 06/09/2022 1  Not Estab. % Final   Neutrophils Absolute 06/09/2022 5.6  1.4 - 7.0 x10E3/uL Final   Lymphocytes Absolute 06/09/2022 1.6  0.7 -  3.1 x10E3/uL Final   Monocytes Absolute 06/09/2022 0.6  0.1 - 0.9 x10E3/uL Final   EOS (ABSOLUTE) 06/09/2022 0.2  0.0 - 0.4 x10E3/uL Final   Basophils Absolute 06/09/2022 0.1  0.0 - 0.2 x10E3/uL Final   Immature Granulocytes 06/09/2022 0  Not Estab. % Final   Immature Grans (Abs) 06/09/2022 0.0  0.0 - 0.1 x10E3/uL Final   Cholesterol, Total 06/09/2022 162  100 - 199 mg/dL Final   Triglycerides 06/09/2022 65  0 - 149 mg/dL Final   HDL 06/09/2022 89  >39 mg/dL Final   VLDL Cholesterol Cal 06/09/2022 13  5 - 40 mg/dL Final   LDL Chol Calc (NIH) 06/09/2022 60  0 - 99 mg/dL Final   Chol/HDL Ratio 06/09/2022 1.8  0.0 - 4.4 ratio Final   Comment:                                   T. Chol/HDL Ratio  Men  Women                               1/2 Avg.Risk  3.4    3.3                                   Avg.Risk  5.0    4.4                                2X Avg.Risk  9.6    7.1                                3X Avg.Risk 23.4   11.0    TSH 06/09/2022 0.596  0.450 - 4.500 uIU/mL Final  Office Visit on 02/07/2022  Component Date Value Ref Range Status   Total Iron Binding Capacity 02/07/2022 336  250 - 450 ug/dL Final   UIBC 02/07/2022 171  131 - 425 ug/dL Final   Iron 02/07/2022 165 (H)  27 - 159 ug/dL Final   Iron Saturation 02/07/2022 49  15 - 55 % Final   Ferritin 02/07/2022 103  15 - 150 ng/mL Final   Vitamin B-12 02/07/2022 502  232 - 1,245 pg/mL Final   Folate 02/07/2022 4.6  >3.0 ng/mL Final   Comment: A serum folate concentration of less than 3.1 ng/mL is considered to represent clinical deficiency.    WBC 02/07/2022 9.7  3.4 - 10.8 x10E3/uL Final   RBC 02/07/2022 4.77  3.77 - 5.28 x10E6/uL Final   Hemoglobin 02/07/2022 14.2  11.1 - 15.9 g/dL Final   Hematocrit 02/07/2022 41.0  34.0 - 46.6 % Final   MCV 02/07/2022 86  79 - 97 fL Final   MCH 02/07/2022 29.8  26.6 - 33.0 pg Final   MCHC 02/07/2022 34.6  31.5 - 35.7 g/dL Final   RDW 02/07/2022  13.2  11.7 - 15.4 % Final   Platelets 02/07/2022 195  150 - 450 x10E3/uL Final   Neutrophils 02/07/2022 76  Not Estab. % Final   Lymphs 02/07/2022 16  Not Estab. % Final   Monocytes 02/07/2022 6  Not Estab. % Final   Eos 02/07/2022 1  Not Estab. % Final   Basos 02/07/2022 1  Not Estab. % Final   Neutrophils Absolute 02/07/2022 7.4 (H)  1.4 - 7.0 x10E3/uL Final   Lymphocytes Absolute 02/07/2022 1.6  0.7 - 3.1 x10E3/uL Final   Monocytes Absolute 02/07/2022 0.6  0.1 - 0.9 x10E3/uL Final   EOS (ABSOLUTE) 02/07/2022 0.1  0.0 - 0.4 x10E3/uL Final   Basophils Absolute 02/07/2022 0.1  0.0 - 0.2 x10E3/uL Final   Immature Granulocytes 02/07/2022 0  Not Estab. % Final   Immature Grans (Abs) 02/07/2022 0.0  0.0 - 0.1 x10E3/uL Final   Retic Ct Pct 02/07/2022 1.0  0.6 - 2.6 % Final   Glucose 02/07/2022 138 (H)  70 - 99 mg/dL Final   BUN 02/07/2022 11  6 - 24 mg/dL Final   Creatinine, Ser 02/07/2022 0.73  0.57 - 1.00 mg/dL Final   eGFR 02/07/2022 104  >59 mL/min/1.73 Final   BUN/Creatinine Ratio 02/07/2022 15  9 - 23 Final   Sodium 02/07/2022 134  134 - 144 mmol/L Final   Potassium 02/07/2022  3.8  3.5 - 5.2 mmol/L Final   Chloride 02/07/2022 96  96 - 106 mmol/L Final   CO2 02/07/2022 19 (L)  20 - 29 mmol/L Final   Calcium 02/07/2022 9.7  8.7 - 10.2 mg/dL Final   Total Protein 02/07/2022 7.4  6.0 - 8.5 g/dL Final   Albumin 02/07/2022 4.7  3.8 - 4.8 g/dL Final   Globulin, Total 02/07/2022 2.7  1.5 - 4.5 g/dL Final   Albumin/Globulin Ratio 02/07/2022 1.7  1.2 - 2.2 Final   Bilirubin Total 02/07/2022 1.2  0.0 - 1.2 mg/dL Final   Alkaline Phosphatase 02/07/2022 82  44 - 121 IU/L Final   AST 02/07/2022 33  0 - 40 IU/L Final   ALT 02/07/2022 18  0 - 32 IU/L Final    Allergies: Patient has no known allergies.  PTA Medications: (Not in a hospital admission)   Long Term Goals: Improvement in symptoms so as ready for discharge  Short Term Goals: Patient will verbalize feelings in meetings with  treatment team members., Patient will attend at least of 50% of the groups daily., Pt will complete the PHQ9 on admission, day 3 and discharge., Patient will participate in completing the [place], Patient will score a low risk of violence for 24 hours prior to discharge, and Patient will take medications as prescribed daily.  Medical Decision Making  Patient will be [facility] to [facility] for management of alcohol withdrawal symptoms and stabilization.  -reviewed available lab results   Alcohol Use Disorder, severe:              -Initiate CIWA protocol -lorazepam 1 mg every 6 hours prn for CIWA >10 -thiamine 100 mg daily for nutritional supplementation -hydroxyzine 25 mg every 6 hours prn for anxiety, CIWA < or = 10 -ondansetron 4 mg ODT every 6 hours prn nausea/vomiting -loperamide 2-4 mg capsule prn diarrhea or loose stools  Major Depressive Disorder, severe without psychotic features  -Resume home medication--Lexapro 56m/day     Recommendations  Based on my evaluation the patient does not appear to have an emergency medical condition.  EOphelia Shoulder NP 07/24/22  1:17 AM

## 2022-07-24 NOTE — ED Notes (Addendum)
Pt sitting in dining room watching TV. A&O x4, calm and cooperative. Denies current SI/HI/AVH. No signs of distress noted. Monitoring for safety.  

## 2022-07-24 NOTE — Discharge Instructions (Addendum)
SAMHSA National Helpline: [phone] Confidential free help, from public health agencies, to find substance use treatment and information.   [facility] [phone]: [facility]  Dear Aram Beecham,  Most effective treatment for your mental health disease involves BOTH a psychiatrist AND a therapist Psychiatrist to manage medications: Please talk to your doctor about starting naltrexone for alcohol cessation and cravings Therapist to help identify personal goals, barriers from those goals, and plan to achieve those goals by understanding emotions Please make regular appointments with an outpatient psychiatrist and other doctors once you leave the hospital (if any, otherwise, please see below for resources to make an appointment).  For therapy outside the hospital, please ask for these specific types of therapy: CBT ________________________________________________________  SAFETY CRISIS  Dial 988 for National Suicide & Crisis Lifeline    Text ([phone] for Crisis Text Line:     [facility] URGENT CARE:  [address] FIRST FLOOR.  [place], [place] [address].  [phone]  Mobile Crisis Response Teams Listed by counties in vicinity of [facility] providers [facility] [phone] [facility] [phone] [facility] [phone] [facility] [phone] [place]                * [facility] [phone]                * [facility] [phone] [facility] [phone] [facility]  [phone] * [facility] [phone] ________________________________________________________  To see which pharmacy near you is the CHEAPEST for certain medications, please use GoodRx. It is free website  and has a free phone app.    Also consider looking at [facility] $4.00 or [facility]'s $7.00 prescription list. Both are free to view if googled "[facility] $4 prescription" and "public's $7 prescription". These are set prices, no insurance required. [facility] low cost medications: $4-$15 for 30days prescriptions or $10-$38 for 90days prescriptions  ________________________________________________________  Difficulties with sleep?   Can also use this free app for insomnia called CBT-I. Let your doctors and therapists know so they can help with extra tips and tricks or for guidance and accountability. NO ADDS on the app.     ________________________________________________________  Non-Emergent / Urgent  [facility] [address] SECOND FLOOR [place], [place] [address] [phone] OUTPATIENT Walk-in information: Please note, all walk-ins are first come & first serve, with limited number of availability.  Please note that to be eligible for services you must bring: ID or a piece of mail with your name [facility] address  Therapist for therapy:  Monday & Wednesdays: Please ARRIVE at 7:15 AM for registration Will START at 8:00 AM Every 1st & 2nd Friday of the month: Please ARRIVE at 10:15 AM for registration Will START at 1 PM - 5 PM  Psychiatrist for medication management: Monday - Friday:  Please ARRIVE at 7:15 AM for registration Will START at 8:00 AM  Regretfully, due to limited availability, please be aware that you may not been seen on the same day as walk-in. Please consider making an appoint or try again. Thank you for your patience and understanding.

## 2022-07-25 DIAGNOSIS — F1020 Alcohol dependence, uncomplicated: Secondary | ICD-10-CM | POA: Diagnosis not present

## 2022-07-25 MED ORDER — ESCITALOPRAM OXALATE 20 MG PO TABS: 20.0000 mg | ORAL_TABLET | Freq: Every morning | ORAL | 0 refills | Status: DC

## 2022-07-25 MED ORDER — NICOTINE POLACRILEX 4 MG MT GUM: 4.0000 mg | CHEWING_GUM | OROMUCOSAL | 0 refills | Status: DC | PRN

## 2022-07-25 MED ORDER — NICOTINE 14 MG/24HR TD PT24: 14.0000 mg | MEDICATED_PATCH | Freq: Every day | TRANSDERMAL | 0 refills | Status: DC

## 2022-07-25 NOTE — ED Provider Notes (Signed)
FBC/OBS ASAP Discharge Summary  Date and Time: 07/25/2022, 7:11 PM  Name: Brandy Vega  Age: 46 y.o.  DOB: [birth year]  MRN:  [id number]   Discharge Diagnoses:  Final diagnoses:  GAD (generalized anxiety disorder)  Severe episode of recurrent major depressive disorder, without psychotic features ([place])  Essential hypertension  Alcohol use disorder, severe, dependence ([place])  Current smoker    HPI:  Per H&P: " Brandy Vega is a 46 year old female with psychiatric history significant for anxiety, depression, substance-induced mood disorder, and alcohol use disorder.  Patient was brought voluntarily to [facility] by law enforcement reporting increased depressive symptoms and alcohol consumption.  Patient was evaluated and recommended for admission to [facility] for stabilization.   This nurse practitioner reviewed patient's chart, met with patient face-to-face, and consulted with Dr. Lovette Cliche.  On evaluation, patient is calm and cooperative, alert and oriented x 4.  Her speech is clear and coherent.  She has good eye contact.  Patient's thought process is coherent and relevant.  She did not appear to be responding to any internal/external stimuli or experiencing any delusional thought content during this assessment.   Patient reports that she has been experiencing worsening depressive symptoms especially during the holiday. She endorses drinking excessive amount of alcohol to cope with depressive symptoms. Patient's BAL on admission was 355m/dl.  She denies history of alcohol withdrawal seizures or DTs.  She says that she experiences hand tremors, sweats, chills, and irritability during period of alcohol detox.   she endorses depressive symptoms of hopelessness, isolation, restlessness, irritability, poor sleep, and fatigue. She denies suicidal ideation, history of suicidal attempt, homicidal ideation, paranoia, hallucinations and other substance abuse except for alcohol. "  Subjective:  Reported that  she declined naltrexone last night due to thinking that she was supposed to get a medication that started with an "m". She was amenable to letting outpatient provider start naltrexone as they will be following her. She denied any other concerns and had no other questions.   For safety planning. She denied access to guns or weapons. In a mental health crisis, SI/HI/AVH, she would reach out to mom and boyfriend. She was aware of 988 and 911. [facility]   Suicidal Thoughts: No (Contracted to safety) Homicidal Thoughts: No Hallucinations: None (Denied AVH)  Mood: Euthymic Sleep:Good Appetite: Good  Review of Systems  Respiratory:  Negative for shortness of breath.   Cardiovascular:  Negative for chest pain.  Gastrointestinal:  Negative for nausea and vomiting.  Neurological:  Negative for dizziness and headaches.    Stay Summary:  [name] a 46y.o. female with PMH of AUD (no h/o DT or sz), MDD, GAD, inpatient psych admission (last time 2019), no suicide attempt, HTN, HLD, who [facility] after calling [facility] with concerns of SI in the setting of increasing EtOH use (x3days) and intoxication due to holidays. [facility] after no longer intoxicated, for treatment and detox of EtOH in the setting of worsening depression.  Total duration of encounter: 2 days    Patient was pleasant, cooperative, and engaged with treatment and therapy. There was no behavioral concerns nor agitation PRNs required.  AUD, severe Last drink was 11/26. Denied h/o seizures and DT. Denied opiate use, and amenable to starting naltrexone after discussing risk, benefits, side effects. Denied ever being on naltrexone in the past to her knowledge.  BAL 352, AST/ALT wnl. CIWA  4,3,0.0 Reported no tremors or shakes or other w/d sxs. CIWA with ativan PRN per protocol with thiamine & MV supplement Naltrexone was  started for patient, however she declined it believing that the rx would start with an "m" - encouraged discussion with outpatient provider to start outpatient.   MDD  GAD Reported adherence to meds, lexapro was increased 2 months ago to good effects. Her SI prior to [facility], appeared to be due to EtOH intoxication after increased use over past 3 days, due to the holidays, which patient did not give further information on. Aside from worsening mood over thanksgiving, her depression and anxiety is reportedly well managed with current lexapro dose.  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   CHANGE how you take these medications    cetirizine 10 MG tablet; Commonly known as: ZyrTEC Allergy; Take 1  tablet (10 mg total) by mouth daily.; What changed: when to take this  Melatonin 10 MG Tabs; Take 10 mg by mouth daily as needed.; What  changed: reasons to take this   CONTINUE taking these medications    albuterol 108 (90 Base) MCG/ACT inhaler; Commonly known as: VENTOLIN  HFA; Inhale 2 puffs into the lungs every 4 (four)  hours as needed for  wheezing or shortness of breath.  escitalopram 20 MG tablet; Commonly known as: Lexapro; Take 1 tablet (20  mg total) by mouth in the morning.  FOLIC ACID PO   STOP taking these medications    busPIRone 7.5 MG tablet; Commonly known as: BUSPAR    Risk, benefits, side effects were discussed with patient prior to starting or dose changes at patient's agreement.  While future psychiatric events cannot be accurately predicted, the patient does not currently require acute inpatient psychiatric care and does not currently meet [facility] involuntary commitment criteria.  Past Psychiatric History: Per H&P Past Medical History:  Past Medical History:  Diagnosis Date   Bipolar 1 disorder ([place])    Essential hypertension 09/05/2018   GAD (generalized anxiety disorder) 06/25/2019   Hypertriglyceridemia 07/25/2021   MDD (major depressive disorder), recurrent episode, severe ([place]) 03/18/2018    Past Surgical History:  Procedure Laterality Date   DILATION AND CURETTAGE OF UTERUS     Family History:  Family History  Problem Relation Age of Onset   Hypertension Mother    Diabetes Father    Family Psychiatric History: Per H&P Social History:  Social History   Substance and Sexual Activity  Alcohol Use Yes   Comment: occ. use     Social History   Substance and  Sexual Activity  Drug Use No    Social History   Socioeconomic History   Marital status: Single    Spouse name: Not on file   Number of children: Not on file   Years of education: Not on file   Highest education level: Not on file  Occupational History   Not on file  Tobacco Use   Smoking status: Every Day    Packs/day: 0.50    Types: Cigarettes   Smokeless tobacco: Never  Vaping Use   Vaping Use: Never used  Substance and Sexual Activity   Alcohol use: Yes    Comment: occ. use   Drug use: No   Sexual activity: Yes    Birth control/protection: None  Other Topics Concern   Not on file   Social History Narrative   Lives with boyfriend.     Social Determinants of Health   Financial Resource Strain: Not on file  Food Insecurity: Not on file  Transportation Needs: Not on file  Physical Activity: Not on file  Stress: Not on file  Social Connections: Not on file   SDOH:  SDOH Screenings   Depression (PHQ2-9): Low Risk  (07/25/2022)  Recent Concern: Depression (PHQ2-9) - Medium Risk (07/24/2022)  Tobacco Use: High Risk (07/23/2022)   Tobacco Cessation:  A prescription for an FDA-approved tobacco cessation medication provided at discharge  Current Medications:  Current Facility-Administered Medications  Medication Dose Route Frequency Provider Last Rate Last Admin   acetaminophen (TYLENOL) tablet 650 mg  650 mg Oral Q6H PRN Ajibola, Ene A, NP       alum & mag hydroxide-simeth (MAALOX/MYLANTA) 200-200-20 MG/5ML suspension 30 mL  30 mL Oral Q4H PRN Ajibola, Ene A, NP       escitalopram (LEXAPRO) tablet 20 mg  20 mg Oral Daily Ajibola, Ene A, NP   20 mg at 07/25/22 0908   hydrOXYzine (ATARAX) tablet 25 mg  25 mg Oral Q6H PRN Ajibola, Ene A, NP       loperamide (IMODIUM) capsule 2-4 mg  2-4 mg Oral PRN Ajibola, Ene A, NP       LORazepam (ATIVAN) tablet 1 mg  1 mg Oral Q6H PRN Ajibola, Ene A, NP       magnesium hydroxide (MILK OF MAGNESIA) suspension 30 mL  30 mL Oral Daily PRN Ajibola, Ene A, NP       multivitamin with minerals tablet 1 tablet  1 tablet Oral Daily Ajibola, Ene A, NP   1 tablet at 07/25/22 0909   nicotine (NICODERM CQ - dosed in mg/24 hours) patch 14 mg  14 mg Transdermal Daily Merrily Brittle, DO       nicotine polacrilex (NICORETTE) gum 4 mg  4 mg Oral PRN Merrily Brittle, DO       ondansetron (ZOFRAN-ODT) disintegrating tablet 4 mg  4 mg Oral Q6H PRN Ajibola, Ene A, NP       traZODone (DESYREL) tablet 50 mg  50 mg Oral QHS PRN Ajibola, Ene A, NP   50 mg at 07/24/22 2129   Current Outpatient Medications  Medication Sig Dispense Refill   albuterol (VENTOLIN  HFA) 108 (90 Base) MCG/ACT inhaler Inhale 2 puffs into the lungs every 4 (four) hours as needed for wheezing or shortness of breath. 1 each 0   cetirizine (ZYRTEC ALLERGY) 10 MG tablet Take 1 tablet (10 mg total) by mouth daily. (Patient taking differently: Take 10 mg by mouth in the morning.) 90 tablet 1   escitalopram (LEXAPRO) 20  MG tablet Take 1 tablet (20 mg total) by mouth in the morning. 30 tablet 0   FOLIC ACID PO Take 1 tablet by mouth in the morning. OTC folic acid, unknown strength.     Melatonin 10 MG TABS Take 10 mg by mouth daily as needed. (Patient taking differently: Take 10 mg by mouth daily as needed (sleep).) 90 tablet 1   [START ON 07/26/2022] nicotine (NICODERM CQ - DOSED IN MG/24 HOURS) 14 mg/24hr patch Place 1 patch (14 mg total) onto the skin daily. 28 patch 0   nicotine polacrilex (NICORETTE) 4 MG gum Take 1 each (4 mg total) by mouth as needed for smoking cessation. 100 tablet 0    PTA Medications: (Not in a hospital admission)     07/25/2022   12:22 PM 07/24/2022    4:40 PM 07/24/2022    1:11 AM  Depression screen PHQ 2/9  Decreased Interest 0 1 1  Down, Depressed, Hopeless 0 1 1  PHQ - 2 Score 0 2 2  Altered sleeping 1 0 1  Tired, decreased energy 1 1   Change in appetite 0 1 1  Feeling bad or failure about yourself  0 0 2  Trouble concentrating 0 0 1  Moving slowly or fidgety/restless 0 0 0  Suicidal thoughts 0 0 0  PHQ-9 Score _0 Difficult doing work/chores Not difficult at all Somewhat difficult Somewhat difficult    Flowsheet Row ED from 07/23/2022 in [facility] Most recent reading at 07/23/2022 11:51 PM ED from 07/23/2022 in [place] Most recent reading at 07/23/2022  4:24 AM Telephone from 04/30/2018 in [place] Most recent reading at 04/30/2018  5:26 PM  C-SSRS RISK CATEGORY Low Risk No Risk No Risk       Musculoskeletal  Strength & Muscle Tone: within normal  limits Gait & Station: normal Patient leans: N/A   Psychiatric Specialty Exam   Presentation  General Appearance:Appropriate for Environment, Casual, Fairly Groomed Eye Contact:Good Speech:Clear and Coherent, Normal Rate Volume:Normal Handedness:Right  Mood and Affect  Mood:Euthymic Affect:Appropriate, Congruent, Full Range  Thought Process  Thought Process:Coherent, Goal Directed, Linear Descriptions of Associations:Intact  Thought Content Suicidal Thoughts:Suicidal Thoughts: No (Contracted to safety) Homicidal Thoughts:Homicidal Thoughts: No Hallucinations:Hallucinations: None (Denied AVH) Ideas of Reference:None Thought Content:Logical  Sensorium  Memory:Immediate Good Judgment:Fair Insight:Fair  Executive Functions  Orientation:Full (Time, Place and Person) Language:Good Concentration:Good [place] of Knowledge:Good  Psychomotor Activity  Psychomotor Activity:Psychomotor Activity: Normal  Assets  Assets:Communication Skills, Desire for Improvement, Housing, Physical Health, Social Support, Resilience, Financial Resources/Insurance  Sleep  Quality:Good  Physical Exam  BP (!) 148/86 (BP Location: Right Arm)   Pulse 85   Temp 98.1 F (36.7 C) (Tympanic)   Resp 18   SpO2 99%   Physical Exam Vitals and nursing note reviewed.  Constitutional:      General: She is awake. She is not in acute distress.    Appearance: She is not ill-appearing or diaphoretic.  HENT:     Head: Normocephalic.  Pulmonary:     Effort: Pulmonary effort is normal. No respiratory distress.  Neurological:     Mental Status: She is alert.     Demographic Factors:  Low socioeconomic status  Loss Factors: NA  Historical Factors: Impulsivity  Risk Reduction Factors:   Employed, Living with another person, especially a relative, Positive social support, and Positive therapeutic relationship  Continued Clinical Symptoms:  Alcohol/Substance  Abuse/Dependencies  Cognitive  Features That Contribute To Risk:  None    Suicide Risk:  Mild:  Suicidal ideation of limited frequency, intensity, duration, and specificity.  There are no identifiable plans, no associated intent, mild dysphoria and related symptoms, good self-control (both objective and subjective assessment), few other risk factors, and identifiable protective factors, including available and accessible social support.  Plan Of Care/Follow-up recommendations:  Activity and diet at tolerated.  Please: Take all medications as prescribed by your mental healthcare provider. Report any adverse effects and or reactions from the medicines to your outpatient provider promptly. Do not engage in alcohol and or illegal drug use while on prescription medicines.  Disposition: Home with partner and family with SIOP appointment 07/26/2022  Total Time spent with patient: 20 minutes  Signed: Merrily Brittle, DO Psychiatry Resident, PGY-2 [facility] 07/25/2022, 7:11 PM

## 2022-07-25 NOTE — ED Notes (Signed)
Pt asleep in bed. Respirations even and unlabored. Monitoring for safety. 

## 2022-07-25 NOTE — ED Notes (Signed)
Patient siting in dayroom eating lunch. Respirations equal and unlabored, skin warm and dry, NAD. No change in assessment or acuity. Q 15 minute safety checks remain in place.

## 2022-07-25 NOTE — ED Notes (Signed)
Patient A&O x 4, ambulatory. Patient discharged in no acute distress. Patient denied SI/HI, A/VH upon discharge. Patient verbalized understanding of all discharge instructions explained by staff, to include follow up appointments, RX's and safety plan. Patient reported mood 10/10.  Pt belongings returned to patient from locker #  3 intact. Patient escorted to lobby via staff for transport to destination. Safety maintained.  

## 2022-07-25 NOTE — ED Notes (Signed)
Patient A&Ox4.Patient is calm and pleasant. Patient denies SI/Hi and AVH. Patient denies any physical complaints when asked. No acute distress noted. Support and encouragement provided. Routine safety checks conducted according to facility protocol. Encouraged patient to notify staff if thoughts of harm toward self or others arise. Patient verbalize understanding and agreement. Will continue to monitor for safety.

## 2022-07-25 NOTE — ED Notes (Signed)
"  Pt denies SI, plan, and intention.  Suicide safety plan completed, reviewed with this RN, given to the patient, and a copy in the chart." 

## 2022-07-25 NOTE — ED Notes (Signed)
Patient did not have a belongings sheet but all valuables was returned to patient from locker 3. Patient verified all belongings was there. Witnessed by Delice Bison, Charity fundraiser.

## 2022-08-04 ENCOUNTER — Telehealth: Payer: Self-pay

## 2022-08-04 NOTE — Telephone Encounter (Signed)
Patient calls to report that blood pressure is 76/34, her nose has been bleeding for several hours and she feels weak.  Patient was advised to call 911 for evaluation and possible transport to ER.  Patient voiced agreement and understanding.

## 2022-08-08 ENCOUNTER — Ambulatory Visit (INDEPENDENT_AMBULATORY_CARE_PROVIDER_SITE_OTHER): Payer: 59 | Admitting: Family

## 2022-08-08 ENCOUNTER — Encounter: Payer: Self-pay | Admitting: Family

## 2022-08-08 VITALS — BP 125/86 | HR 112 | Temp 97.0°F | Ht 66.0 in | Wt 113.0 lb

## 2022-08-08 DIAGNOSIS — F411 Generalized anxiety disorder: Secondary | ICD-10-CM

## 2022-08-08 DIAGNOSIS — I10 Essential (primary) hypertension: Secondary | ICD-10-CM

## 2022-08-08 DIAGNOSIS — F332 Major depressive disorder, recurrent severe without psychotic features: Secondary | ICD-10-CM

## 2022-08-08 DIAGNOSIS — F1020 Alcohol dependence, uncomplicated: Secondary | ICD-10-CM | POA: Diagnosis not present

## 2022-08-08 DIAGNOSIS — I471 Supraventricular tachycardia, unspecified: Secondary | ICD-10-CM

## 2022-08-08 DIAGNOSIS — R002 Palpitations: Secondary | ICD-10-CM

## 2022-08-08 DIAGNOSIS — Z09 Encounter for follow-up examination after completed treatment for conditions other than malignant neoplasm: Secondary | ICD-10-CM

## 2022-08-08 DIAGNOSIS — F17200 Nicotine dependence, unspecified, uncomplicated: Secondary | ICD-10-CM

## 2022-08-08 MED ORDER — BUPROPION HCL ER (XL) 150 MG PO TB24: 150.0000 mg | ORAL_TABLET | Freq: Every day | ORAL | 1 refills | Status: DC

## 2022-08-08 MED ORDER — BUSPIRONE HCL 5 MG PO TABS: 5.0000 mg | ORAL_TABLET | Freq: Three times a day (TID) | ORAL | 1 refills | Status: DC | PRN

## 2022-08-08 NOTE — Patient Instructions (Signed)

## 2022-08-08 NOTE — Progress Notes (Signed)
Subjective:    Patient ID: Brandy Vega, female    DOB: 06-09-1976, 46 y.o.   MRN: [id number]  Chief Complaint  Patient presents with   Medical Management of Chronic Issues   Anxiety   Depression   Pt presents to the office today for chronic follow up and hospital follow up.  She was admitted to [facility] with SI on 07/23/22 and increased her alcohol intake.   Reports the holidays are hard. Her father passed away years ago, but his birthday was recently and missing him.    She states her anxiety has worsen because the decrease health and Cardiologists recommended a catheter ablation. This has made her very anxious. She has not has this done.    She has SVT and her symptoms are stable at this time. Could not tolerate beta blocker.    She has not drank any alcohol since she was discharged from hospital.  Anxiety Presents for follow-up visit. Symptoms include decreased concentration, depressed mood, excessive worry, hyperventilation, insomnia, irritability, nervous/anxious behavior, palpitations and restlessness. Symptoms occur constantly. The severity of symptoms is severe.    Depression        This is a chronic problem.  The current episode started more than 1 year ago.   The onset quality is gradual.   The problem occurs intermittently.  Associated symptoms include decreased concentration, insomnia and restlessness.  Past treatments include SNRIs - Serotonin and norepinephrine reuptake inhibitors.  Past medical history includes anxiety.   Nicotine Dependence Presents for follow-up visit. Symptoms include decreased concentration, insomnia and irritability. Her urge triggers include company of smokers. The symptoms have been stable. She smokes < 1/2 a pack of cigarettes per day.      Review of Systems  Constitutional:  Positive for irritability.  Cardiovascular:  Positive for palpitations.  Psychiatric/Behavioral:  Positive for decreased concentration and depression. The  patient is nervous/anxious and has insomnia.   All other systems reviewed and are negative.      Objective:   Physical Exam Vitals reviewed.  Constitutional:      General: She is not in acute distress.    Appearance: She is well-developed.  HENT:     Head: Normocephalic and atraumatic.     Right Ear: Tympanic membrane normal.     Left Ear: Tympanic membrane normal.  Eyes:     Pupils: Pupils are equal, round, and reactive to light.  Neck:     Thyroid: No thyromegaly.  Cardiovascular:     Rate and Rhythm: Normal rate and regular rhythm.     Heart sounds: Normal heart sounds. No murmur heard. Pulmonary:     Effort: Pulmonary effort is normal. No respiratory distress.     Breath sounds: Normal breath sounds. No wheezing.  Abdominal:     General: Bowel sounds are normal. There is no distension.     Palpations: Abdomen is soft.     Tenderness: There is no abdominal tenderness.  Musculoskeletal:        General: No tenderness. Normal range of motion.     Cervical back: Normal range of motion and neck supple.  Skin:    General: Skin is warm and dry.  Neurological:     Mental Status: She is alert and oriented to person, place, and time.     Cranial Nerves: No cranial nerve deficit.     Deep Tendon Reflexes: Reflexes are normal and symmetric.  Psychiatric:        Mood and Affect: Mood is  depressed. Affect is tearful.        Behavior: Behavior normal.        Thought Content: Thought content normal.        Judgment: Judgment normal.       BP 125/86   Pulse (!) 112   Temp (!) 97 F (36.1 C)   Ht 5\' 6"  (1.676 m)   Wt 113 lb (51.3 kg)   SpO2 99%   BMI 18.24 kg/m      Assessment & Plan:  Brandy Vega comes in today with chief complaint of Medical Management of Chronic Issues, Anxiety, and Depression   Diagnosis and orders addressed:  1. SVT (supraventricular tachycardia)  2. GAD (generalized anxiety disorder) Will add Wellbutrin today Buspar as needed  Stress  management  - buPROPion (WELLBUTRIN XL) 150 MG 24 hr tablet; Take 1 tablet (150 mg total) by mouth daily.  Dispense: 90 tablet; Refill: 1 - busPIRone (BUSPAR) 5 MG tablet; Take 1 tablet (5 mg total) by mouth 3 (three) times daily as needed.  Dispense: 90 tablet; Refill: 1  3. Essential hypertension  4. Alcohol use disorder, severe, dependence (HCC) Reports she has not drank since discharge from hospital   5. Palpitations  6. Current smoker  7. Severe episode of recurrent major depressive disorder, without psychotic features (HCC) - buPROPion (WELLBUTRIN XL) 150 MG 24 hr tablet; Take 1 tablet (150 mg total) by mouth daily.  Dispense: 90 tablet; Refill: 1 - busPIRone (BUSPAR) 5 MG tablet; Take 1 tablet (5 mg total) by mouth 3 (three) times daily as needed.  Dispense: 90 tablet; Refill: 1  8. Hospital discharge follow-up    Health Maintenance reviewed Diet and exercise encouraged  Follow up plan: 1 month  Carrie Mew, FNP

## 2022-10-07 ENCOUNTER — Encounter (HOSPITAL_COMMUNITY): Payer: Self-pay | Admitting: *Deleted

## 2022-10-07 ENCOUNTER — Emergency Department (HOSPITAL_COMMUNITY)
Admission: EM | Admit: 2022-10-07 | Discharge: 2022-10-07 | Disposition: A | Discharge status: Home / Self Care | Payer: 59 | Attending: Emergency Medicine | Admitting: Emergency Medicine

## 2022-10-07 ENCOUNTER — Other Ambulatory Visit: Payer: Self-pay

## 2022-10-07 DIAGNOSIS — F1994 Other psychoactive substance use, unspecified with psychoactive substance-induced mood disorder: Secondary | ICD-10-CM | POA: Insufficient documentation

## 2022-10-07 DIAGNOSIS — I10 Essential (primary) hypertension: Secondary | ICD-10-CM | POA: Insufficient documentation

## 2022-10-07 DIAGNOSIS — Y908 Blood alcohol level of 240 mg/100 ml or more: Secondary | ICD-10-CM | POA: Diagnosis not present

## 2022-10-07 DIAGNOSIS — F10129 Alcohol abuse with intoxication, unspecified: Secondary | ICD-10-CM | POA: Insufficient documentation

## 2022-10-07 DIAGNOSIS — F10929 Alcohol use, unspecified with intoxication, unspecified: Secondary | ICD-10-CM

## 2022-10-07 DIAGNOSIS — R45851 Suicidal ideations: Secondary | ICD-10-CM | POA: Insufficient documentation

## 2022-10-07 LAB — CBC WITH DIFFERENTIAL/PLATELET
Abs Immature Granulocytes: 0.05 10*3/uL (ref 0.00–0.07)
Basophils Absolute: 0.1 10*3/uL (ref 0.0–0.1)
Basophils Relative: 1 %
Eosinophils Absolute: 0.1 10*3/uL (ref 0.0–0.5)
Eosinophils Relative: 1 %
HCT: 39.1 % (ref 36.0–46.0)
Hemoglobin: 12.8 g/dL (ref 12.0–15.0)
Immature Granulocytes: 1 %
Lymphocytes Relative: 18 %
Lymphs Abs: 1.9 10*3/uL (ref 0.7–4.0)
MCH: 29.3 pg (ref 26.0–34.0)
MCHC: 32.7 g/dL (ref 30.0–36.0)
MCV: 89.5 fL (ref 80.0–100.0)
Monocytes Absolute: 0.4 10*3/uL (ref 0.1–1.0)
Monocytes Relative: 4 %
Neutro Abs: 7.9 10*3/uL — ABNORMAL HIGH (ref 1.7–7.7)
Neutrophils Relative %: 75 %
Platelets: 225 10*3/uL (ref 150–400)
RBC: 4.37 MIL/uL (ref 3.87–5.11)
RDW: 13.2 % (ref 11.5–15.5)
WBC: 10.4 10*3/uL (ref 4.0–10.5)
nRBC: 0 % (ref 0.0–0.2)

## 2022-10-07 LAB — COMPREHENSIVE METABOLIC PANEL
ALT: 15 U/L (ref 0–44)
AST: 25 U/L (ref 15–41)
Albumin: 4.5 g/dL (ref 3.5–5.0)
Alkaline Phosphatase: 57 U/L (ref 38–126)
Anion gap: 13 (ref 5–15)
BUN: 10 mg/dL (ref 6–20)
CO2: 21 mmol/L — ABNORMAL LOW (ref 22–32)
Calcium: 8.9 mg/dL (ref 8.9–10.3)
Chloride: 111 mmol/L (ref 98–111)
Creatinine, Ser: 0.68 mg/dL (ref 0.44–1.00)
GFR, Estimated: >60 mL/min (ref >60)
Glucose, Bld: 92 mg/dL (ref 70–99)
Potassium: 3.6 mmol/L (ref 3.5–5.1)
Sodium: 145 mmol/L (ref 135–145)
Total Bilirubin: 0.5 mg/dL (ref 0.3–1.2)
Total Protein: 7.6 g/dL (ref 6.5–8.1)

## 2022-10-07 LAB — RAPID URINE DRUG SCREEN, HOSP PERFORMED
Amphetamines: NOT DETECTED
Barbiturates: NOT DETECTED
Benzodiazepines: NOT DETECTED
Cocaine: NOT DETECTED
Opiates: NOT DETECTED
Tetrahydrocannabinol: NOT DETECTED

## 2022-10-07 LAB — ETHANOL: Alcohol, Ethyl (B): 281 mg/dL — ABNORMAL HIGH (ref <10)

## 2022-10-07 LAB — POC URINE PREG, ED: Preg Test, Ur: NEGATIVE

## 2022-10-07 MED ORDER — BUPROPION HCL ER (XL) 150 MG PO TB24
150.0000 mg | ORAL_TABLET | Freq: Every day | ORAL | Status: DC
  Administered 2022-10-07: 150 mg via ORAL
  Filled 2022-10-07: qty 1
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
## 2022-10-07 NOTE — BH Assessment (Addendum)
Comprehensive Clinical Assessment (CCA) Note  10/07/2022 Brandy Vega [id number]  Chief Complaint:  Chief Complaint  Patient presents with   Suicidal   Visit Diagnosis: Substance induced mood disorder  Disposition:  Per Beatriz Stallion NP pt does not meet inpatient criteria and can be discharged into the care of her mother, Lannette Donath.    [place] ED from 10/07/2022 in [facility] Emergency Department at [facility] Most recent reading at 10/07/2022  7:25 AM ED from 07/23/2022 in [facility] Most recent reading at 07/23/2022 11:51 PM ED from 07/23/2022 in [facility] Emergency Department at [facility] Most recent reading at 07/23/2022  4:24 AM  C-SSRS RISK CATEGORY Low Risk Low Risk No Risk      The patient demonstrates the following risk factors for suicide: Chronic risk factors for suicide include: psychiatric disorder of bipolar disorder and substance use disorder. Acute risk factors for suicide include: family or marital conflict. Protective factors for this patient include: responsibility to others (children, family). Considering these factors, the overall suicide risk at this point appears to be low. Patient is appropriate for outpatient follow up.  Brandy Vega is a 47 yo female transported to [place] by [facility] police after stating that she wanted to kill herself by lying on train tracks. Pt also reports that she was under the influence of alcohol "a lot of it".  Ethanol levels were reported 281 2 hours ago. Pt is denying current SI and states that she made that comment "because I was under the influence and because me and boyfriend got into an argument. I don't really want to do it, i just need some outpatient help--i just said that to get attention". Pt states that she is not under the current psychiatric care of a psychiatrist--but PCP is managing medication for treating bipolar disorder. Pt is unclear what medication she is taking other than  buspar. Pt states that she is compliant with her medication. Pt states that she recently had a 3 day stay at [facility] in [facility].  Pt has history of etoh abuse and ED visits for suicidal ideation. Pt states that she currently resides with her boyfriend. Pt has close relationship with mother and has no children. Pt was admitted at [facility] and was sober for a year after discharge. Pt states that 2020 (COVID) was a trigger and that she has been drinking heavily since then. Pt denies current SI, HI, or AVH. Pt denies any self harm behaviors and states that she does not use any substances other than alcohol and nicotine. Pt denies any medical issues or chronic pain. Pt states relationship conflict is her primary external stressor currently. Pt states that she does not feel she is currently a danger to herself or anyone else.   CCA Screening, Triage and Referral (STR)  Patient Reported Information How did you hear about [place]? Legal System  What Is the Reason for Your Visit/Call Today? Brandy Vega is a 47 yo female transported to [place] by [facility] police after stating that she wanted to kill herself by lying on train tracks. Pt also reports that she was under the influence of alcohol "a lot of it".  Ethanol levels were reported 281 2 hours ago. Pt is denying current SI and states that she made that comment "because I was under the influence and because me and boyfriend got into an argument. I don't really want to do it, i just need some outpatient help--i just said that to get attention". Pt  states that she is not under the current psychiatric care of a psychiatrist--but PCP is managing medication for treating bipolar disorder. Pt is unclear what medication she is taking other than buspar. Pt states that she is compliant with her medication. Pt states that she recently had a 3 day stay at [facility] in [facility].  Pt has history of etoh abuse and ED visits for suicidal ideation. Pt states that she currently resides with her boyfriend.  Pt has close relationship with mother and has no children. Pt was admitted at [facility] and was sober for a year after discharge. Pt states that 2020 (COVID) was a trigger and that she has been drinking heavily since then. Pt denies current SI, HI, or AVH. Pt denies any self harm behaviors and states that she does not use any substances other than alcohol and nicotine. Pt denies any medical issues or chronic pain. Pt states relationship conflict is her primary external stressor currently. Pt states that she does not feel she is currently a danger to herself or anyone else.  How Long Has This Been Causing You Problems? <Week  What Do You Feel Would Help You the Most Today? Treatment for Depression or other mood problem   Have You Recently Had Any Thoughts About Hurting Yourself? Yes  Are You Planning to Commit Suicide/Harm Yourself At This time? No   Flowsheet Row ED from 10/07/2022 in [facility] Emergency Department at [facility] Most recent reading at 10/07/2022  7:25 AM ED from 07/23/2022 in [facility] Most recent reading at 07/23/2022 11:51 PM ED from 07/23/2022 in [facility] Emergency Department at [facility] Most recent reading at 07/23/2022  4:24 AM  C-SSRS RISK CATEGORY Low Risk Low Risk No Risk       Have you Recently Had Thoughts About [place]? No  Are You Planning to Harm Someone at This Time? No  Explanation: Patient reports that she expressed suicidal ideation with plan to lay on train tracks, but made that statement only because she was under the influence of alcohol and does not really mean it   Have You Used Any Alcohol or Drugs in the Past 24 Hours? No  What Did You Use and How Much? Patient reports that she drinks 1/5 of alcohol per night   Do You Currently Have a Therapist/Psychiatrist? No  Name of Therapist/Psychiatrist: Name of Therapist/Psychiatrist: None   Have You Been Recently Discharged  From Any Office Practice or Programs? No  Explanation of Discharge From Practice/Program: None     CCA Screening Triage Referral Assessment Type of Contact: Tele-Assessment  Telemedicine Service Delivery: Telemedicine service delivery: This service was provided via telemedicine using a 2-way, interactive audio and video technology  Is this Initial or Reassessment? Is this Initial or Reassessment?: Initial Assessment  Date Telepsych consult ordered in [facility]:  Date Telepsych consult ordered in [facility]: 10/07/22  Time Telepsych consult ordered in [facility]:    Location of Assessment: [facility]  Provider Location: [facility] Assessment Services   Collateral Involvement: Patient's mother: Bobbyjo [id number]   Does Patient Have a [place]? No  Legal Guardian Contact Information: No legal guardian  Copy of Legal Guardianship Form: No - copy requested  Legal Guardian Notified of Arrival: -- (Patient does not have a legal guardian)  Legal Guardian Notified of Pending Discharge: -- (Patient does not have a legal guardian)  If Minor and Not Living with Parent(s), Who has Custody? Patient  is not a minor and does not have a legal guardian  Is CPS involved or ever been involved? Never  Is APS involved or ever been involved? Never   Patient Determined To Be At Risk for Harm To Self or Others Based on Review of Patient Reported Information or Presenting Complaint? No  Method: Plan without intent  Availability of Means: Has close by (Patient states that she does reside close to the train tracks)  Intent: Vague intent or NA (Patient denies any intent to follow through)  Notification Required: No need or identified person  Additional Information for Danger to Others Potential: -- (Patient has history of suicidal ideation)  Additional Comments for Danger to Others Potential: None  Are There Guns or Other Weapons in Your Home? No  Types of Guns/Weapons: No weapons in  the home  Are These Weapons Safely Secured?                            No  Who Could Verify You Are Able To Have These Secured: No weapons in the home  Do You Have any Outstanding Charges, Pending Court Dates, Parole/Probation? Patient denies any legal concern at time of session  Contacted To Inform of Risk of Harm To Self or Others: Other: Comment    Does Patient Present under Involuntary Commitment? No    [place] of Residence: [place]   Patient Currently Receiving the Following Services: Medication Management   Determination of Need: Emergent (2 hours)   Options For Referral: Medication Management; Outpatient Therapy     CCA Biopsychosocial Patient Reported Schizophrenia/Schizoaffective Diagnosis in Past: No   Strengths: Positive family supports, good self awareness   Mental Health Symptoms Depression:   Hopelessness   Duration of Depressive symptoms:  Duration of Depressive Symptoms: Greater than two weeks   Mania:   None   Anxiety:    Worrying   Psychosis:   None   Duration of Psychotic symptoms:    Trauma:   None   Obsessions:   None   Compulsions:   None   Inattention:   None   Hyperactivity/Impulsivity:   None   Oppositional/Defiant Behaviors:   Angry (Patient reports she gets angry only when situationally triggered)   Emotional Irregularity:  No data recorded  Other Mood/Personality Symptoms:   None    Mental Status Exam Appearance and self-care  Stature:   Average   Weight:   Average weight   Clothing:   Neat/clean   Grooming:   Normal   Cosmetic use:   None   Posture/gait:   Normal   Motor activity:   Not Remarkable   Sensorium  Attention:   Normal   Concentration:   Normal   Orientation:   X5   Recall/memory:   Normal   Affect and Mood  Affect:   Anxious; Depressed   Mood:   Anxious; Depressed   Relating  Eye contact:   Normal   Facial expression:   Depressed; Anxious   Attitude  toward examiner:   Cooperative   Thought and Language  Speech flow:  Clear and Coherent   Thought content:   Appropriate to Mood and Circumstances   Preoccupation:   None   Hallucinations:   None   Organization:   Coherent; Goal-directed   [employer] of Knowledge:   Good   Intelligence:   Average   Abstraction:   Normal   Judgement:   Impaired  Reality Testing:   Variable   Insight:   Gaps   Decision Making:   Impulsive   Social Functioning  Social Maturity:   Impulsive   Social Judgement:   Heedless   Stress  Stressors:   Family conflict; Relationship   Coping Ability:   Overwhelmed   Skill Deficits:   Self-control; Interpersonal   Supports:   Family     Religion: Religion/Spirituality Are You A Religious Person?: No How Might This Affect Treatment?: Will not affect  Leisure/Recreation: Leisure / Recreation Do You Have Hobbies?: Yes Leisure and Hobbies: Spending time with boyfriend, watching TV to be  Exercise/Diet: Exercise/Diet Do You Exercise?: Yes What Type of Exercise Do You Do?: Run/Walk How Many Times a Week Do You Exercise?: 6-7 times a week Have You Gained or Lost A Significant Amount of Weight in the Past Six Months?: No Do You Follow a Special Diet?: No Do You Have Any Trouble Sleeping?: No   CCA Employment/Education Employment/Work Situation: Employment / Work Situation Employment Situation: Employed Work Stressors: 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 Patient's Job has Been Impacted by Current Illness: No Has Patient ever Been in Passenger transport manager?: No  Education: Education Is Patient Currently Attending School?: No Last Grade Completed: 8 (Patient states that she received a GED) Did Physicist, medical?: No Did You Have An Individualized Education Program (IIEP): No Did You Have Any Difficulty At [employer]?: No Patient's Education Has Been Impacted by Current Illness:  No   CCA Family/Childhood History Family and Relationship History: Family history Marital status: Long term relationship Does patient have children?: No  Childhood History:  Childhood History By whom was/is the patient raised?: Both parents Did patient suffer any verbal/emotional/physical/sexual abuse as a child?: No Did patient suffer from severe childhood neglect?: No Has patient ever been sexually abused/assaulted/raped as an adolescent or adult?: No Was the patient ever a victim of a crime or a disaster?: No Witnessed domestic violence?: No Has patient been affected by domestic violence as an adult?: No    CCA Substance Use Alcohol/Drug Use: Alcohol / Drug Use Pain Medications: SEE MAR Prescriptions: SEE MAR Over the Counter: SEE MAR History of alcohol / drug use?: Yes Longest period of sobriety (when/how long): ONE YEAR Negative Consequences of Use: Personal relationships Withdrawal Symptoms: Diarrhea Substance #1 Name of Substance 1: ETOH 1 - Amount (size/oz): 1/5 OF LIQUOR PER DAY 1 - Frequency: DAILY 1 - Duration: YEARS 1 - Last Use / Amount: YESTERDAY 1 - Method of Aquiring: LEGAL 1- Route of Use: ORAL/DRINK      ASAM's:  Six Dimensions of Multidimensional Assessment  Dimension 1:  Acute Intoxication and/or Withdrawal Potential:   Dimension 1:  Description of individual's past and current experiences of substance use and withdrawal: PT SUCCESSFULLY COMPLETED INPATIENT ADMISSION FELLOWSHIP HALL IN 2019.  Dimension 2:  Biomedical Conditions and Complications:      Dimension 3:  Emotional, Behavioral, or Cognitive Conditions and Complications:     Dimension 4:  Readiness to Change:     Dimension 5:  Relapse, Continued use, or Continued Problem Potential:     Dimension 6:  Recovery/Living Environment:     ASAM Severity Score: ASAM's Severity Rating Score: 9  ASAM Recommended Level of Treatment: ASAM Recommended Level of Treatment: Level I Outpatient Treatment    Substance use Disorder (SUD) Substance Use Disorder (SUD)  Checklist Symptoms of Substance Use: Continued use despite having a persistent/recurrent physical/psychological problem caused/exacerbated by use, Continued  use despite persistent or recurrent social, interpersonal problems, caused or exacerbated by use  Recommendations for Services/Supports/Treatments: Recommendations for Services/Supports/Treatments Recommendations For Services/Supports/Treatments: Individual Therapy, Medication Management  Discharge Disposition: Discharge Disposition Medical Exam completed: Yes Disposition of Patient: Discharge (Per Beatriz Stallion NP pt discharged into care of her mother, Lannette Donath)  [place] Diagnoses: Patient Active Problem List   Diagnosis Date Noted   Substance induced mood disorder ([place]) 07/23/2022   SVT (supraventricular tachycardia) 11/15/2021   Hypertriglyceridemia 07/25/2021   Palpitations 11/16/2020   Current smoker 09/17/2020   Chronic insomnia 03/31/2020   Alcohol use disorder, severe, dependence ([place]) 02/25/2020   GAD (generalized anxiety disorder) 06/25/2019   Essential hypertension 09/05/2018   DUB (dysfunctional uterine bleeding) 05/16/2018   MDD (major depressive disorder), recurrent episode, severe ([place]) 03/18/2018     Referrals to Alternative Service(s): Referred to Alternative Service(s):   Place:   Date:   Time:    Referred to Alternative Service(s):   Place:   Date:   Time:    Referred to Alternative Service(s):   Place:   Date:   Time:    Referred to Alternative Service(s):   Place:   Date:   Time:     Rachel Bo Janson Lamar, LCSW

## 2022-10-07 NOTE — ED Notes (Signed)
Patient moved to locker room for TTS at this time. Sitter present.
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
## 2022-10-07 NOTE — ED Notes (Signed)
Pt received breakfast tray 

## 2022-10-07 NOTE — ED Notes (Signed)
Pt changed into appropriate [facility] attire.  Pt belongings secured in [facility] lockers.  Belongings included shoes, jeans, tshirt and sweatshirt.  Phone and keys taken to security safe.  Pt wanded by security.  Pt calm and cooperative at this time.  [place] police at bedside.

## 2022-10-10 ENCOUNTER — Encounter: Payer: Self-pay | Admitting: Family

## 2022-10-10 ENCOUNTER — Telehealth: Payer: Self-pay | Admitting: Family

## 2022-10-10 ENCOUNTER — Telehealth (INDEPENDENT_AMBULATORY_CARE_PROVIDER_SITE_OTHER): Payer: 59 | Admitting: Family

## 2022-10-10 DIAGNOSIS — F411 Generalized anxiety disorder: Secondary | ICD-10-CM

## 2022-10-10 DIAGNOSIS — Z09 Encounter for follow-up examination after completed treatment for conditions other than malignant neoplasm: Secondary | ICD-10-CM

## 2022-10-10 DIAGNOSIS — F332 Major depressive disorder, recurrent severe without psychotic features: Secondary | ICD-10-CM | POA: Diagnosis not present

## 2022-10-10 DIAGNOSIS — F1020 Alcohol dependence, uncomplicated: Secondary | ICD-10-CM | POA: Diagnosis not present

## 2022-10-10 NOTE — Progress Notes (Signed)
Virtual Visit Consent   Brandy Vega, you are scheduled for a virtual visit with a [facility] provider today. Just as with appointments in the office, your consent must be obtained to participate. Your consent will be active for this visit and any virtual visit you may have with one of our providers in the next 365 days. If you have a MyChart account, a copy of this consent can be sent to you electronically.  As this is a virtual visit, video technology does not allow for your provider to perform a traditional examination. This may limit your provider's ability to fully assess your condition. If your provider identifies any concerns that need to be evaluated in person or the need to arrange testing (such as labs, EKG, etc.), we will make arrangements to do so. Although advances in technology are sophisticated, we cannot ensure that it will always work on either your end or our end. If the connection with a video visit is poor, the visit may have to be switched to a telephone visit. With either a video or telephone visit, we are not always able to ensure that we have a secure connection.  By engaging in this virtual visit, you consent to the provision of healthcare and authorize for your insurance to be billed (if applicable) for the services provided during this visit. Depending on your insurance coverage, you may receive a charge related to this service.  I need to obtain your verbal consent now. Are you willing to proceed with your visit today? Brandy Vega has provided verbal consent on 10/10/2022 for a virtual visit (video or telephone). [name], FNP  Date: 10/10/2022 1:00 PM  Virtual Visit via Video Note   I, [name], connected with  Brandy Vega  ([id number], [id number]) on 10/10/22 at 12:25 PM EST by a video-enabled telemedicine application and verified that I am speaking with the correct person using two identifiers.  Location: Patient: Virtual Visit Location Patient:  Home Provider: Virtual Visit Location Provider: Home Office   I discussed the limitations of evaluation and management by telemedicine and the availability of in person appointments. The patient expressed understanding and agreed to proceed.    History of Present Illness: Brandy Vega is a 47 y.o. who identifies as a female who was assigned female at birth, and is being seen today for "feeling off".She has hx of alcohol abuse and "fell off again". States last week she has been heavily drinking. She has not drank any alcohol today.   She was hospitalized on 10/07/22 after being brought in by police after laying down on train tracks. She states this was triggered by an argument with her husband. Denies any SI at this time.    HPI: Anxiety Presents for follow-up visit. Symptoms include excessive worry, insomnia, irritability, nervous/anxious behavior and restlessness. Symptoms occur occasionally. The severity of symptoms is severe.    Depression        This is a chronic problem.  The current episode started more than 1 year ago.   The onset quality is gradual.   The problem occurs intermittently.  Associated symptoms include fatigue, helplessness, hopelessness, insomnia and restlessness.  Associated symptoms include not sad.  Past treatments include SSRIs - Selective serotonin reuptake inhibitors.  Past medical history includes anxiety.     Problems:  Patient Active Problem List   Diagnosis Date Noted   Substance induced mood disorder ([place]) 07/23/2022   SVT (supraventricular tachycardia) 11/15/2021   Hypertriglyceridemia 07/25/2021   Palpitations 11/16/2020  Current smoker 09/17/2020   Chronic insomnia 03/31/2020   Alcohol use disorder, severe, dependence ([place]) 02/25/2020   GAD (generalized anxiety disorder) 06/25/2019   Essential hypertension 09/05/2018   DUB (dysfunctional uterine bleeding) 05/16/2018   MDD (major depressive disorder), recurrent episode, severe ([place]) 03/18/2018     Allergies: No Known Allergies Medications:  Current Outpatient Medications:    albuterol (VENTOLIN HFA) 108 (90 Base) MCG/ACT inhaler, Inhale 2 puffs into the lungs every 4 (four) hours as needed for wheezing or shortness of breath., Disp: 1 each, Rfl: 0   buPROPion (WELLBUTRIN XL) 150 MG 24 hr tablet, Take 1 tablet (150 mg total) by mouth daily. (Patient not taking: Reported on 10/07/2022), Disp: 90 tablet, Rfl: 1   busPIRone (BUSPAR) 5 MG tablet, Take 1 tablet (5 mg total) by mouth 3 (three) times daily as needed., Disp: 90 tablet, Rfl: 1   cetirizine (ZYRTEC ALLERGY) 10 MG tablet, Take 1 tablet (10 mg total) by mouth daily., Disp: 90 tablet, Rfl: 1   escitalopram (LEXAPRO) 20 MG tablet, Take 1 tablet (20 mg total) by mouth in the morning., Disp: 30 tablet, Rfl: 0   ibuprofen (ADVIL) 800 MG tablet, Take 800 mg by mouth every 8 (eight) hours as needed for moderate pain., Disp: , Rfl:    Melatonin 10 MG TABS, Take 10 mg by mouth daily as needed. (Patient taking differently: Take 10 mg by mouth daily as needed (sleep).), Disp: 90 tablet, Rfl: 1   nicotine polacrilex (NICORETTE) 4 MG gum, Take 1 each (4 mg total) by mouth as needed for smoking cessation., Disp: 100 tablet, Rfl: 0  Observations/Objective: Patient is well-developed, well-nourished in no acute distress.  Resting comfortably at home.  Head is normocephalic, atraumatic.  No labored breathing.  Speech is clear and coherent with logical content.  Patient is alert and oriented at baseline.  Pt crying  Assessment and Plan: 1. Alcohol use disorder, severe, dependence ([place])  2. GAD (generalized anxiety disorder)  3. Severe episode of recurrent major depressive disorder, without psychotic features ([place])  4. Hospital discharge follow-up  Denies any SI or plans at this time. Reports she is just angry at herself for starting drinking again. Her mother has taken all alcohol out of her home.  Pt will take lexapro and Wellbutrin right  now She will also take Buspar 5 mg today Stress management  Discussed in length about going to ED with ANY SI  Follow up in 1 week with me   Follow Up Instructions: I discussed the assessment and treatment plan with the patient. The patient was provided an opportunity to ask questions and all were answered. The patient agreed with the plan and demonstrated an understanding of the instructions.  A copy of instructions were sent to the patient via MyChart unless otherwise noted below.     The patient was advised to call back or seek an in-person evaluation if the symptoms worsen or if the condition fails to improve as anticipated.  Time:  I spent 20 minutes with the patient via telehealth technology discussing the above problems/concerns.    [name], FNP

## 2022-10-17 ENCOUNTER — Ambulatory Visit: Payer: 59 | Admitting: Family

## 2022-11-03 ENCOUNTER — Encounter: Payer: Self-pay | Admitting: Family

## 2022-11-03 ENCOUNTER — Ambulatory Visit (INDEPENDENT_AMBULATORY_CARE_PROVIDER_SITE_OTHER): Payer: 59 | Admitting: Family

## 2022-11-03 VITALS — BP 107/71 | HR 76 | Temp 98.2°F | Ht 65.0 in | Wt 118.6 lb

## 2022-11-03 DIAGNOSIS — I10 Essential (primary) hypertension: Secondary | ICD-10-CM | POA: Diagnosis not present

## 2022-11-03 DIAGNOSIS — F339 Major depressive disorder, recurrent, unspecified: Secondary | ICD-10-CM

## 2022-11-03 DIAGNOSIS — F411 Generalized anxiety disorder: Secondary | ICD-10-CM | POA: Diagnosis not present

## 2022-11-03 DIAGNOSIS — F1011 Alcohol abuse, in remission: Secondary | ICD-10-CM

## 2022-11-03 DIAGNOSIS — F332 Major depressive disorder, recurrent severe without psychotic features: Secondary | ICD-10-CM

## 2022-11-03 DIAGNOSIS — R350 Frequency of micturition: Secondary | ICD-10-CM

## 2022-11-03 DIAGNOSIS — I471 Supraventricular tachycardia, unspecified: Secondary | ICD-10-CM

## 2022-11-03 DIAGNOSIS — F17200 Nicotine dependence, unspecified, uncomplicated: Secondary | ICD-10-CM

## 2022-11-03 LAB — URINALYSIS, COMPLETE
Bilirubin, UA: NEGATIVE
Glucose, UA: NEGATIVE
Ketones, UA: NEGATIVE
Leukocytes,UA: NEGATIVE
Nitrite, UA: NEGATIVE
Protein,UA: NEGATIVE
RBC, UA: NEGATIVE
Specific Gravity, UA: 1.025 (ref 1.005–1.030)
Urobilinogen, Ur: 0.2 mg/dL (ref 0.2–1.0)
pH, UA: 5.5 (ref 5.0–7.5)

## 2022-11-03 LAB — MICROSCOPIC EXAMINATION
Bacteria, UA: NONE SEEN
Epithelial Cells (non renal): NONE SEEN /hpf (ref 0–10)
RBC, Urine: NONE SEEN /hpf (ref 0–2)
Renal Epithel, UA: NONE SEEN /hpf
WBC, UA: NONE SEEN /hpf (ref 0–5)

## 2022-11-03 MED ORDER — BUPROPION HCL ER (XL) 300 MG PO TB24: 300.0000 mg | ORAL_TABLET | Freq: Every day | ORAL | 1 refills | Status: DC

## 2022-11-03 MED ORDER — ESCITALOPRAM OXALATE 20 MG PO TABS: 20.0000 mg | ORAL_TABLET | Freq: Every morning | ORAL | 0 refills | Status: DC

## 2022-11-03 MED ORDER — BUSPIRONE HCL 7.5 MG PO TABS: 7.5000 mg | ORAL_TABLET | Freq: Three times a day (TID) | ORAL | 1 refills | Status: AC | PRN

## 2022-11-03 MED ORDER — IBUPROFEN 800 MG PO TABS: 800.0000 mg | ORAL_TABLET | Freq: Three times a day (TID) | ORAL | 1 refills | Status: DC | PRN

## 2022-11-03 NOTE — Progress Notes (Signed)
Subjective:    Patient ID: Brandy Vega, female    DOB: 1975-09-09, 47 y.o.   MRN: [id number]  Chief Complaint  Patient presents with   Medical Management of Chronic Issues   Pt presents to the office today for chronic follow up.   Brandy Vega by [facility] every month after having SI on 07/23/22 and increased Brandy Vega alcohol intake.Reports the holidays are hard. Brandy Vega father passed away years ago, but his birthday was recently and missing him.   Brandy Vega has SVT and Brandy Vega symptoms are stable at this time. Could not tolerate beta blocker. Cardiologists recommended a catheter ablation, however, states this made Brandy Vega very anxious and never scheduled.    Brandy Vega has not drank any alcohol since Brandy Vega was discharged from hospital.  Hypertension This is a chronic problem. The current episode started more than 1 year ago. The problem has been resolved since onset. The problem is controlled. Associated symptoms include anxiety and malaise/fatigue. Pertinent negatives include no peripheral edema or shortness of breath. The current treatment provides moderate improvement. There is no history of heart failure.  Depression        This is a chronic problem.  The current episode started more than 1 year ago.   The onset quality is gradual.   The problem occurs intermittently.  Associated symptoms include decreased concentration, fatigue, restlessness and sad.  Associated symptoms include no helplessness and no hopelessness.  Past treatments include SSRIs - Selective serotonin reuptake inhibitors.  Past medical history includes anxiety.   Anxiety Presents for follow-up visit. Symptoms include decreased concentration, depressed mood, excessive worry, irritability, nervous/anxious behavior and restlessness. Patient reports no nausea or shortness of breath. The severity of symptoms is moderate.    Urinary Frequency  This is a new problem. The current episode started more than 1 month ago. The problem occurs  intermittently. The pain is at a severity of 0/10. The patient is experiencing no pain. Associated symptoms include frequency and urgency. Pertinent negatives include no hematuria, nausea or vomiting. Brandy Vega has tried increased fluids for the symptoms. The treatment provided mild relief.      Review of Systems  Constitutional:  Positive for fatigue, irritability and malaise/fatigue.  Respiratory:  Negative for shortness of breath.   Gastrointestinal:  Negative for nausea and vomiting.  Genitourinary:  Positive for frequency and urgency. Negative for hematuria.  Psychiatric/Behavioral:  Positive for decreased concentration and depression. The patient is nervous/anxious.   All other systems reviewed and are negative.      Objective:   Physical Exam Vitals reviewed.  Constitutional:      General: Brandy Vega is not in acute distress.    Appearance: Brandy Vega is well-developed.  HENT:     Head: Normocephalic and atraumatic.     Right Ear: Tympanic membrane normal.     Left Ear: Tympanic membrane normal.  Eyes:     Pupils: Pupils are equal, round, and reactive to light.  Neck:     Thyroid: No thyromegaly.  Cardiovascular:     Rate and Rhythm: Normal rate and regular rhythm.     Heart sounds: Normal heart sounds. No murmur heard. Pulmonary:     Effort: Pulmonary effort is normal. No respiratory distress.     Breath sounds: Normal breath sounds. No wheezing.  Abdominal:     General: Bowel sounds are normal. There is no distension.     Palpations: Abdomen is soft.     Tenderness: There is no abdominal tenderness.  Musculoskeletal:  General: No tenderness. Normal range of motion.     Cervical back: Normal range of motion and neck supple.  Skin:    General: Skin is warm and dry.  Neurological:     Mental Status: Brandy Vega is alert and oriented to person, place, and time.     Cranial Nerves: No cranial nerve deficit.     Deep Tendon Reflexes: Reflexes are normal and symmetric.  Psychiatric:         Behavior: Behavior normal.        Thought Content: Thought content normal.        Judgment: Judgment normal.       BP 107/71   Pulse 76   Temp 98.2 F (36.8 C) (Temporal)   Ht '5\' 5"'$  (1.651 m)   Wt 118 lb 9.6 oz (53.8 kg)   LMP 10/05/2022   SpO2 97%   BMI 19.74 kg/m      Assessment & Plan:   Brandy Vega comes in today with chief complaint of Medical Management of Chronic Issues   Diagnosis and orders addressed:  1. Severe episode of recurrent major depressive disorder, without psychotic features ([place]) - escitalopram (LEXAPRO) 20 MG tablet; Take 1 tablet (20 mg total) by mouth in the morning.  Dispense: 30 tablet; Refill: 0  2. Essential hypertension - buPROPion (WELLBUTRIN XL) 300 MG 24 hr tablet; Take 1 tablet (300 mg total) by mouth daily.  Dispense: 90 tablet; Refill: 1  3. GAD (generalized anxiety disorder) - escitalopram (LEXAPRO) 20 MG tablet; Take 1 tablet (20 mg total) by mouth in the morning.  Dispense: 30 tablet; Refill: 0 - buPROPion (WELLBUTRIN XL) 300 MG 24 hr tablet; Take 1 tablet (300 mg total) by mouth daily.  Dispense: 90 tablet; Refill: 1 - busPIRone (BUSPAR) 7.5 MG tablet; Take 1 tablet (7.5 mg total) by mouth 3 (three) times daily as needed.  Dispense: 270 tablet; Refill: 1  4. Alcohol abuse, in remission  5. Current smoker  6. SVT (supraventricular tachycardia)  7. Depression, recurrent ([place]) - escitalopram (LEXAPRO) 20 MG tablet; Take 1 tablet (20 mg total) by mouth in the morning.  Dispense: 30 tablet; Refill: 0  8. Urinary frequency - Urinalysis, Complete  Will increase Wellbutrin to 300 mg from 150 mg Increase Buspar to 7.5 mg from 5 mg TID prn  Stress management  Labs pending Health Maintenance reviewed Diet and exercise encouraged  Follow up plan: 1 month recheck GAD and Depression   Brandy Dun, FNP

## 2022-11-03 NOTE — Patient Instructions (Signed)

## 2022-11-04 LAB — CMP14+EGFR
ALT: 10 IU/L (ref 0–32)
AST: 21 IU/L (ref 0–40)
Albumin/Globulin Ratio: 2 (ref 1.2–2.2)
Albumin: 5.1 g/dL — ABNORMAL HIGH (ref 3.9–4.9)
Alkaline Phosphatase: 67 IU/L (ref 44–121)
BUN/Creatinine Ratio: 11 (ref 9–23)
BUN: 9 mg/dL (ref 6–24)
Bilirubin Total: 0.3 mg/dL (ref 0.0–1.2)
CO2: 18 mmol/L — ABNORMAL LOW (ref 20–29)
Calcium: 9.8 mg/dL (ref 8.7–10.2)
Chloride: 100 mmol/L (ref 96–106)
Creatinine, Ser: 0.79 mg/dL (ref 0.57–1.00)
Globulin, Total: 2.6 g/dL (ref 1.5–4.5)
Glucose: 70 mg/dL (ref 70–99)
Potassium: 4.1 mmol/L (ref 3.5–5.2)
Sodium: 138 mmol/L (ref 134–144)
Total Protein: 7.7 g/dL (ref 6.0–8.5)
eGFR: 94 mL/min/{1.73_m2} (ref >59)

## 2022-12-07 ENCOUNTER — Ambulatory Visit: Payer: 59 | Admitting: Family

## 2022-12-08 ENCOUNTER — Encounter: Payer: Self-pay | Admitting: Family

## 2022-12-22 ENCOUNTER — Encounter: Payer: Self-pay | Admitting: Family

## 2022-12-22 ENCOUNTER — Ambulatory Visit (INDEPENDENT_AMBULATORY_CARE_PROVIDER_SITE_OTHER): Payer: 59 | Admitting: Family

## 2022-12-22 VITALS — BP 114/74 | HR 66 | Temp 97.1°F | Ht 65.0 in | Wt 121.0 lb

## 2022-12-22 DIAGNOSIS — F332 Major depressive disorder, recurrent severe without psychotic features: Secondary | ICD-10-CM

## 2022-12-22 DIAGNOSIS — J301 Allergic rhinitis due to pollen: Secondary | ICD-10-CM | POA: Diagnosis not present

## 2022-12-22 DIAGNOSIS — F1994 Other psychoactive substance use, unspecified with psychoactive substance-induced mood disorder: Secondary | ICD-10-CM | POA: Diagnosis not present

## 2022-12-22 DIAGNOSIS — F411 Generalized anxiety disorder: Secondary | ICD-10-CM | POA: Diagnosis not present

## 2022-12-22 MED ORDER — IBUPROFEN 800 MG PO TABS: 800.0000 mg | ORAL_TABLET | Freq: Three times a day (TID) | ORAL | 1 refills | Status: DC | PRN

## 2022-12-22 MED ORDER — CETIRIZINE HCL 10 MG PO TABS: 10.0000 mg | ORAL_TABLET | Freq: Every day | ORAL | 1 refills | Status: DC

## 2022-12-22 NOTE — Patient Instructions (Signed)

## 2022-12-22 NOTE — Progress Notes (Signed)
Subjective:    Patient ID: Brandy Vega, female    DOB: 23-Mar-1976, 47 y.o.   MRN: [id number]  Chief Complaint  Patient presents with   Follow-up   PT presents to the office today to discuss depression and anxiety. She is currently taking Lexapro 20 mg, Wellbutrin 300 mg, and Buspar 7.5 mg BID prn. Reports her depression and anxiety has improved.   She was seeing [facility], but hasn't seen them in the last few months.   She was hospitalized for SI on 07/23/22 after drinking binge. She reports her father passed away several years ago, but that time was around his birthday and holidays.   She reports the last time she drank was a week ago. Reports she is drinking 2 beers a week.   She has allergies and takes zyrtec as needed.  Depression        This is a chronic problem.  The current episode started more than 1 year ago.   Associated symptoms include fatigue, restlessness, decreased interest and sad.  Associated symptoms include no helplessness, no hopelessness and no suicidal ideas.  Past treatments include SSRIs - Selective serotonin reuptake inhibitors.  Past medical history includes anxiety.   Anxiety Presents for follow-up visit. Symptoms include excessive worry, palpitations and restlessness. Patient reports no suicidal ideas. Symptoms occur occasionally.    Nicotine Dependence Presents for follow-up visit. Symptoms include fatigue. Her urge triggers include company of smokers. The symptoms have been stable. She smokes < 1/2 a pack of cigarettes per day.      Review of Systems  Constitutional:  Positive for fatigue.  Cardiovascular:  Positive for palpitations.  Psychiatric/Behavioral:  Positive for depression. Negative for suicidal ideas.   All other systems reviewed and are negative.      Objective:   Physical Exam Vitals reviewed.  Constitutional:      General: She is not in acute distress.    Appearance: She is well-developed.  HENT:     Head:  Normocephalic and atraumatic.     Right Ear: Tympanic membrane normal.     Left Ear: Tympanic membrane normal.  Eyes:     Pupils: Pupils are equal, round, and reactive to light.  Neck:     Thyroid: No thyromegaly.  Cardiovascular:     Rate and Rhythm: Normal rate and regular rhythm.     Heart sounds: Normal heart sounds. No murmur heard. Pulmonary:     Effort: Pulmonary effort is normal. No respiratory distress.     Breath sounds: Normal breath sounds. No wheezing.  Abdominal:     General: Bowel sounds are normal. There is no distension.     Palpations: Abdomen is soft.     Tenderness: There is no abdominal tenderness.  Musculoskeletal:        General: No tenderness. Normal range of motion.     Cervical back: Normal range of motion and neck supple.  Skin:    General: Skin is warm and dry.  Neurological:     Mental Status: She is alert and oriented to person, place, and time.     Cranial Nerves: No cranial nerve deficit.     Deep Tendon Reflexes: Reflexes are normal and symmetric.  Psychiatric:        Behavior: Behavior normal.        Thought Content: Thought content normal.        Judgment: Judgment normal.       BP 114/74   Pulse 66   Temp Marland Kitchen)  97.1 F (36.2 C) (Temporal)   Ht 5\' 5"  (1.651 m)   Wt 121 lb (54.9 kg)   SpO2 99%   BMI 20.14 kg/m      Assessment & Plan:  Brandy Vega comes in today with chief complaint of Follow-up   Diagnosis and orders addressed:  1. GAD (generalized anxiety disorder)  2. Severe episode of recurrent major depressive disorder, without psychotic features (HCC)  3. Substance induced mood disorder (HCC)  4. Seasonal allergic rhinitis due to pollen - cetirizine (ZYRTEC ALLERGY) 10 MG tablet; Take 1 tablet (10 mg total) by mouth daily.  Dispense: 90 tablet; Refill: 1   Continue current medications Discussed importance of decreasing alcohol  Encouraged to contact behavorial health  Health Maintenance reviewed Diet and exercise  encouraged  Follow up plan: 3 months    [name], FNP

## 2023-02-05 ENCOUNTER — Encounter: Payer: Self-pay | Admitting: *Deleted

## 2023-03-19 ENCOUNTER — Emergency Department (HOSPITAL_COMMUNITY): Payer: 59

## 2023-03-19 ENCOUNTER — Encounter (HOSPITAL_COMMUNITY): Payer: Self-pay | Admitting: Emergency Medicine

## 2023-03-19 ENCOUNTER — Other Ambulatory Visit: Payer: Self-pay

## 2023-03-19 ENCOUNTER — Emergency Department (HOSPITAL_COMMUNITY)
Admission: EM | Admit: 2023-03-19 | Discharge: 2023-03-19 | Disposition: A | Discharge status: Home / Self Care | Payer: 59 | Attending: Emergency Medicine | Admitting: Emergency Medicine

## 2023-03-19 DIAGNOSIS — I10 Essential (primary) hypertension: Secondary | ICD-10-CM | POA: Insufficient documentation

## 2023-03-19 DIAGNOSIS — M545 Low back pain, unspecified: Secondary | ICD-10-CM | POA: Diagnosis not present

## 2023-03-19 DIAGNOSIS — W19XXXA Unspecified fall, initial encounter: Secondary | ICD-10-CM

## 2023-03-19 DIAGNOSIS — M25521 Pain in right elbow: Secondary | ICD-10-CM | POA: Diagnosis not present

## 2023-03-19 DIAGNOSIS — M25511 Pain in right shoulder: Secondary | ICD-10-CM | POA: Insufficient documentation

## 2023-03-19 DIAGNOSIS — W010XXA Fall on same level from slipping, tripping and stumbling without subsequent striking against object, initial encounter: Secondary | ICD-10-CM | POA: Diagnosis not present

## 2023-03-19 DIAGNOSIS — M542 Cervicalgia: Secondary | ICD-10-CM | POA: Insufficient documentation

## 2023-03-19 MED ORDER — METHOCARBAMOL 500 MG PO TABS
750.0000 mg | ORAL_TABLET | Freq: Once | ORAL | Status: AC
  Administered 2023-03-19: 750 mg via ORAL
  Filled 2023-03-19: qty 2

## 2023-03-19 NOTE — ED Notes (Signed)
Pt returned from XRAY  Reattached to partial monitor

## 2023-03-19 NOTE — ED Notes (Signed)
Pt requesting pain medication Waiting on MD

## 2023-03-19 NOTE — ED Triage Notes (Signed)
Pt via [facility] from home c/o right sided chest wall, right shoulder, and right arm pain after a recent fall at home. Pt notes some paresthesia to fingers with abduction. Pt received 324mg  ASA en route. 12 lead WNL.

## 2023-03-19 NOTE — ED Notes (Signed)
Pt walked to and from restroom without assistance Medicated per [facility]

## 2023-03-19 NOTE — ED Notes (Signed)
Pt stated that she feel down stair 2 weeks ago and had no pain at that time  Pt stated that today she went to sleep and woke up from a nap with all of her pain.  Pt holding head to the RIGHT, will not turn neck. Complains of neck pain that radiates down RIGHT shoulder and arm. Pt also complains of tingling in fingers after nap.   Attached to partial monitor 12 lead EKG and xrays completed.  Waiting on further orders.

## 2023-03-19 NOTE — ED Notes (Signed)
Pt ambulated to restroom without assistance.

## 2023-03-19 NOTE — Discharge Instructions (Addendum)
As discussed, your imaging is unremarkable.  You have a previous fracture to your 8th and 9th ribs.  The imaging of your neck, back, shoulder, and elbow are unremarkable.  There are no fractures or dislocations.  Today between Ibuprofen and Tylenol for pain as needed.  You can ice the areas as well. If you continue having neck pain you can try over the counter lidocaine patches.  Follow-up with your primary care provider in 48 to 72 hours for reevaluation of your symptoms.  Return to ED if: your pain worsens or becomes persistent, if you cannot move your neck. If you lose sensation in your arms or legs or lose function of your bowels.

## 2023-03-20 ENCOUNTER — Ambulatory Visit (INDEPENDENT_AMBULATORY_CARE_PROVIDER_SITE_OTHER): Payer: 59 | Admitting: Family

## 2023-03-20 ENCOUNTER — Encounter: Payer: Self-pay | Admitting: Family

## 2023-03-20 DIAGNOSIS — S2241XD Multiple fractures of ribs, right side, subsequent encounter for fracture with routine healing: Secondary | ICD-10-CM

## 2023-03-20 DIAGNOSIS — M5412 Radiculopathy, cervical region: Secondary | ICD-10-CM | POA: Diagnosis not present

## 2023-03-20 DIAGNOSIS — W108XXD Fall (on) (from) other stairs and steps, subsequent encounter: Secondary | ICD-10-CM | POA: Diagnosis not present

## 2023-03-20 DIAGNOSIS — Z09 Encounter for follow-up examination after completed treatment for conditions other than malignant neoplasm: Secondary | ICD-10-CM

## 2023-03-20 MED ORDER — BACLOFEN 10 MG PO TABS: 10.0000 mg | ORAL_TABLET | Freq: Three times a day (TID) | ORAL | 0 refills | Status: DC

## 2023-03-20 MED ORDER — GABAPENTIN 100 MG PO CAPS: 100.0000 mg | ORAL_CAPSULE | Freq: Three times a day (TID) | ORAL | 0 refills | Status: DC

## 2023-03-20 MED ORDER — IBUPROFEN 800 MG PO TABS: 800.0000 mg | ORAL_TABLET | Freq: Three times a day (TID) | ORAL | 1 refills | Status: DC | PRN

## 2023-03-20 MED ORDER — METHYLPREDNISOLONE ACETATE 80 MG/ML IJ SUSP
80.0000 mg | Freq: Once | INTRAMUSCULAR | Status: AC
  Administered 2023-03-20: 80 mg via INTRAMUSCULAR

## 2023-03-20 MED ORDER — KETOROLAC TROMETHAMINE 60 MG/2ML IM SOLN
60.0000 mg | Freq: Once | INTRAMUSCULAR | Status: AC
  Administered 2023-03-20: 60 mg via INTRAMUSCULAR

## 2023-03-20 NOTE — ED Provider Notes (Signed)
[place] EMERGENCY DEPARTMENT AT [facility] Provider Note   CSN: [id number] Arrival date & time: 03/19/23  1702     History  Chief Complaint  Patient presents with   Brandy Vega    Brandy Vega is a 47 y.o. female with a history of hypertension and hypertriglyceridemia who presents to the ED today for neck and back pain 2 weeks after a fall. Patient reports that she tripped and fell and landed on her right side - her shoulder, arm, and right ribs. But, patient is not complaining of pain in these areas at the time of evaluation. She did not hit her head or lose consciousness during the fall. She is not on blood thinners. Today, she wen she woke up from sleeping she had pain in the right side of her neck and upper back that she is concerned about. Patient reports new onset tingling in her fingers but no weakness or decreased sensation.    Home Medications Prior to Admission medications   Medication Sig Start Date End Date Taking? Authorizing Provider  albuterol (VENTOLIN HFA) 108 (90 Base) MCG/ACT inhaler Inhale 2 puffs into the lungs every 4 (four) hours as needed for wheezing or shortness of breath. 05/12/22   Roxy Horseman, PA-C  baclofen (LIORESAL) 10 MG tablet Take 1 tablet (10 mg total) by mouth 3 (three) times daily. 03/20/23   [name] A, FNP  buPROPion (WELLBUTRIN XL) 300 MG 24 hr tablet Take 1 tablet (300 mg total) by mouth daily. 11/03/22   Junie Spencer, FNP  busPIRone (BUSPAR) 7.5 MG tablet Take 1 tablet (7.5 mg total) by mouth 3 (three) times daily as needed. 11/03/22   Junie Spencer, FNP  cetirizine (ZYRTEC ALLERGY) 10 MG tablet Take 1 tablet (10 mg total) by mouth daily. 12/22/22   [name] A, FNP  escitalopram (LEXAPRO) 20 MG tablet Take 1 tablet (20 mg total) by mouth in the morning. 11/03/22 12/03/22  Junie Spencer, FNP  gabapentin (NEURONTIN) 100 MG capsule Take 1 capsule (100 mg total) by mouth 3 (three) times daily. 03/20/23   [name] A, FNP   ibuprofen (ADVIL) 800 MG tablet Take 1 tablet (800 mg total) by mouth every 8 (eight) hours as needed for moderate pain. 03/20/23   [name] A, FNP  Melatonin 10 MG TABS Take 10 mg by mouth daily as needed. Patient taking differently: Take 10 mg by mouth daily as needed (sleep). 02/07/22   Junie Spencer, FNP      Allergies    Patient has no known allergies.    Review of Systems   Review of Systems  Musculoskeletal:  Positive for neck pain.  All other systems reviewed and are negative.   Physical Exam Updated Vital Signs BP (!) 168/90 (BP Location: Left Arm)   Pulse 62   Temp 98 F (36.7 C) (Oral)   Resp 16   Ht 5\' 5"  (1.651 m)   Wt 54.4 kg   LMP 02/21/2023 (Approximate)   SpO2 100%   BMI 19.97 kg/m  Physical Exam Vitals and nursing note reviewed.  Constitutional:      General: She is not in acute distress.    Appearance: Normal appearance.  HENT:     Head: Normocephalic and atraumatic.     Nose: Nose normal.     Mouth/Throat:     Mouth: Mucous membranes are moist.  Eyes:     Conjunctiva/sclera: Conjunctivae normal.     Pupils: Pupils are equal, round,  and reactive to light.  Neck:     Comments: Tenderness to palpation of right side of neck Cardiovascular:     Rate and Rhythm: Normal rate and regular rhythm.     Pulses: Normal pulses.  Pulmonary:     Effort: Pulmonary effort is normal.     Breath sounds: Normal breath sounds.  Abdominal:     Palpations: Abdomen is soft.     Tenderness: There is no abdominal tenderness.  Musculoskeletal:        General: Tenderness present.     Cervical back: Tenderness present.     Comments: Tenderness to thoracic spine without step off or deformities. No paravertebral tenderness to palpation. No tenderness or decreased ROM to right shoulder or elbow. No tenderness to the ribs  Skin:    General: Skin is warm and dry.     Capillary Refill: Capillary refill takes less than 2 seconds.     Findings: No rash.   Neurological:     General: No focal deficit present.     Mental Status: She is alert.     Sensory: No sensory deficit.     Motor: No weakness.  Psychiatric:        Mood and Affect: Mood normal.        Behavior: Behavior normal.     ED Results / Procedures / Treatments   Labs (all labs ordered are listed, but only abnormal results are displayed) Labs Reviewed - No data to display  EKG EKG Interpretation Date/Time:  Monday March 19 2023 17:49:10 EDT Ventricular Rate:  58 PR Interval:  116 QRS Duration:  80 QT Interval:  446 QTC Calculation: 437 R Axis:   73  Text Interpretation: Sinus bradycardia Septal infarct (cited on or before 07-Oct-2022) Abnormal ECG When compared with ECG of 07-Oct-2022 08:25, No significant change was found Confirmed by Drema Pry [id number]) on 03/20/2023 12:11:12 PM  Radiology DG Cervical Spine 2-3 Views  Result Date: 03/19/2023 CLINICAL DATA:  Fall 2 weeks ago, neck pain EXAM: CERVICAL SPINE - 2-3 VIEW COMPARISON:  None Available. FINDINGS: There is no evidence of cervical spine fracture or prevertebral soft tissue swelling. Alignment is normal. No other significant bone abnormalities are identified. IMPRESSION: Negative cervical spine radiographs. Electronically Signed   By: Charlett Nose M.D.   On: 03/19/2023 22:24   DG Thoracic Spine 2 View  Result Date: 03/19/2023 CLINICAL DATA:  Fall 2 weeks ago.  Neck and back pain EXAM: THORACIC SPINE 2 VIEWS COMPARISON:  None Available. FINDINGS: There is no evidence of thoracic spine fracture. Alignment is normal. No other significant bone abnormalities are identified. IMPRESSION: Negative. Electronically Signed   By: Charlett Nose M.D.   On: 03/19/2023 22:23   DG Ribs Unilateral W/Chest Right  Result Date: 03/19/2023 CLINICAL DATA:  Pain after fall EXAM: RIGHT RIBS AND CHEST - 3 VIEW COMPARISON:  Standard x-ray 09/12/2011 FINDINGS: No consolidation, pneumothorax or effusion. Normal cardiopericardial silhouette  without edema. Nipple shadow on identified in the right lung base. Unchanged from previous when adjusting for technique. There is a healing fracture of the posterolateral aspect of the right eighth and ninth ribs. Please correlate for time course of injury. No additional acute fractures identified. IMPRESSION: Healing fracture of the right eighth and ninth rib. Please correlate with the time course of injury. Possibly late subacute to chronic. No underlying more acute appearing fracture or pneumothorax. No effusion Electronically Signed   By: Karen Kays M.D.   On: 03/19/2023 18:24  DG Elbow 2 Views Right  Result Date: 03/19/2023 CLINICAL DATA:  Fall, right elbow pain EXAM: RIGHT ELBOW - 2 VIEW COMPARISON:  None Available. FINDINGS: There is no evidence of fracture, dislocation, or joint effusion. There is no evidence of arthropathy or other focal bone abnormality. Soft tissues are unremarkable. IMPRESSION: Negative. Electronically Signed   By: Larose Hires D.O.   On: 03/19/2023 18:24   DG Shoulder Right  Result Date: 03/19/2023 CLINICAL DATA:  Fall.  Shoulder pain. EXAM: RIGHT SHOULDER - 2+ VIEW COMPARISON:  None Available. FINDINGS: There is no evidence of fracture or dislocation. There is no evidence of arthropathy or other focal bone abnormality. Soft tissues are unremarkable. IMPRESSION: Negative. Electronically Signed   By: Larose Hires D.O.   On: 03/19/2023 18:23    Procedures Procedures: not indicated.   Medications Ordered in ED Medications  methocarbamol (ROBAXIN) tablet 750 mg (750 mg Oral Given 03/19/23 2202)    ED Course/ Medical Decision Making/ A&P                             Medical Decision Making Amount and/or Complexity of Data Reviewed Radiology: ordered.  Risk Prescription drug management.   This patient presents to the ED for concern of neck and upper back pain, this involves an extensive number of treatment options, and is a complaint that carries with it a high  risk of complications and morbidity.   Differential diagnosis includes: muscle strain vs misalignment vs fracture, etc.   Co morbidities that complicate the patient evaluation  Hypertension Anxiety Hypertriglyceridemia   Additional history obtained:  Additional history obtained from patient's records.   Cardiac Monitoring / EKG:  The patient was maintained on a cardiac monitor.  I personally viewed and interpreted the cardiac monitored which showed: sinus bradycardia with a heart rate of 58 bpm.   Imaging Studies ordered:  I ordered imaging studies including X-ray cervical and thoracic spine which showed no evidence of misalignment or fracture. X-ray of right shoulder that showed no fracture, dislocation, or other focal bone abnormality. I agree with the radiologist interpretation   Problem List / ED Course / Critical interventions / Medication management  Neck and thoracic back pain I ordered medications including: Robaxin for pain - the x-ray tech said she spit it out when she went for neck imaging but denied being nauseous on reevaluation I have reviewed the patients home medicines and have made adjustments as needed   Social Determinants of Health:  Housing Physical activity   Test / Admission - Considered:  Discussed results with patient Stable and safe for discharge home. Instructed to use OTC Lidocaine patches for pain. Return precautions provided.       Final Clinical Impression(s) / ED Diagnoses Final diagnoses:  Fall, initial encounter    Rx / DC Orders ED Discharge Orders     None         Maxwell Marion, PA-C 03/20/23 1330    Bethann Berkshire, MD 03/21/23 1322

## 2023-03-20 NOTE — Patient Instructions (Signed)
Cervical Radiculopathy  Cervical radiculopathy happens when a nerve in the neck (a cervical nerve) is pinched or bruised. This condition can happen because of an injury to the cervical spine (vertebrae) in the neck, or as part of the normal aging process. Pressure on the cervical nerves can cause pain or numbness that travels from the neck all the way down to the arm and fingers. This condition usually gets better with rest. Treatment may be needed if the condition does not improve. What are the causes? This condition may be caused by: A neck injury. A bulging (herniated) disk. Muscle spasms. Muscle tightness in the neck due to overuse. Arthritis. Breakdown or degeneration in the bones and joints of the spine (spondylosis) due to aging. Bone spurs that may develop near the cervical nerves. What are the signs or symptoms? Symptoms of this condition include: Pain. The pain may travel from the neck to the arm and hand. The pain can be severe or irritating. It may get worse when you move your neck. Numbness or tingling in your arm or hand. Weakness in the affected arm and hand, in severe cases. How is this diagnosed? This condition may be diagnosed based on your symptoms, your medical history, and a physical exam. You may also have tests, including: X-rays. CT scan. MRI. Electromyogram (EMG). Nerve conduction tests. How is this treated? In many cases, treatment is not needed for this condition. With rest, the condition usually gets better over time. If treatment is needed, options may include: Wearing a soft neck collar (cervical collar) for short periods of time. Doing physical therapy to strengthen your neck muscles. Taking medicines. These may include NSAIDs, such as ibuprofen, or oral corticosteroids. Having spinal injections, in severe cases. Having surgery. This may be needed if other treatments do not help. Different types of surgery may be done depending on the cause of this  condition. Follow these instructions at home: If you have a cervical collar: Wear it as told by your health care provider. Remove it only as told by your health care provider. Ask your health care provider if you can remove the cervical collar for cleaning and bathing. If you are allowed to remove the collar for cleaning or bathing: Follow instructions from your health care provider about how to remove the collar safely. Clean the collar by wiping it with mild soap and water and drying it completely. Take out any removable pads in the collar every 1-2 days, and wash them by hand with soap and water. Let them air-dry completely before you put them back in the collar. Check your skin under the collar for irritation or sores. If you see any, tell your health care provider. Managing pain     Take over-the-counter and prescription medicines only as told by your health care provider. If directed, put ice on the affected area. To do this: If you have a soft neck collar, remove it as told by your health care provider. Put ice in a plastic bag. Place a towel between your skin and the bag. Leave the ice on for 20 minutes, 2-3 times a day. Remove the ice if your skin turns bright red. This is very important. If you cannot feel pain, heat, or cold, you have a greater risk of damage to the area. If applying ice does not help, you can try using heat. Use the heat source that your health care provider recommends, such as a moist heat pack or a heating pad. Place a towel between   your skin and the heat source. Leave the heat on for 20-30 minutes. Remove the heat if your skin turns bright red. This is especially important if you are unable to feel pain, heat, or cold. You have a greater risk of getting burned. Try a gentle neck and shoulder massage to help relieve symptoms. Activity Rest as needed. Return to your normal activities as told by your health care provider. Ask your health care provider what  activities are safe for you. Do stretching and strengthening exercises as told by your health care provider or your physical therapist. You may have to avoid lifting. Ask your health care provider how much you can safely lift. General instructions Use a flat pillow when you sleep. Do not drive while wearing a cervical collar. If you do not have a cervical collar, ask your health care provider if it is safe to drive while your neck heals. Ask your health care provider if the medicine prescribed to you requires you to avoid driving or using machinery. Do not use any products that contain nicotine or tobacco. These products include cigarettes, chewing tobacco, and vaping devices, such as e-cigarettes. If you need help quitting, ask your health care provider. Keep all follow-up visits. This is important. Contact a health care provider if: Your condition does not improve with treatment. Get help right away if: Your pain gets much worse and is not controlled with medicines. You have weakness or numbness in your hand, arm, face, or leg. You have a high fever. You have a stiff, rigid neck. You lose control of your bowels or your bladder (have incontinence). You have trouble with walking, balance, or speaking. Summary Cervical radiculopathy happens when a nerve in the neck is pinched or bruised. A nerve can get pinched from a bulging disk, arthritis, muscle spasms, or an injury to the neck. Symptoms include pain, tingling, or numbness radiating from the neck to the arm or hand. Weakness can also occur in severe cases. Treatment may include rest, wearing a cervical collar, and physical therapy. Medicines may be prescribed to help with pain. In severe cases, injections or surgery may be needed. This information is not intended to replace advice given to you by your health care provider. Make sure you discuss any questions you have with your health care provider. Document Revised: 02/17/2021 Document  Reviewed: 02/17/2021 Elsevier Patient Education  2024 [employer]  

## 2023-03-20 NOTE — Progress Notes (Signed)
Subjective:    Patient ID: Brandy Vega, female    DOB: [birth year], 47 y.o.   MRN: [id number]  Chief Complaint  Patient presents with   Hospitalization Follow-up    Fall    PT presents to the office today for hospital follow up. She reports she fell down her stairs about two weeks ago. She went to the ED after waking up with numbness and tingling in right hand and arm. She had a negative right elbow, right shoulder, cervical, and thoracic x-ray.   Had right eighth and ninth healing rib fractures.   She was discharged on muscle relaxer.  Neck Pain  This is a new problem. The current episode started in the past 7 days. The problem occurs intermittently. The pain is associated with a fall. The pain is present in the right side. The quality of the pain is described as shooting. The pain is at a severity of 10/10. The pain is moderate. The symptoms are aggravated by twisting and bending. Associated symptoms include numbness and tingling. Pertinent negatives include no fever, headaches or photophobia.      Review of Systems  Constitutional:  Negative for fever.  Eyes:  Negative for photophobia.  Musculoskeletal:  Positive for neck pain.  Neurological:  Positive for tingling and numbness. Negative for headaches.  All other systems reviewed and are negative.      Objective:   Physical Exam Vitals reviewed.  Constitutional:      General: She is not in acute distress.    Appearance: She is well-developed.  HENT:     Head: Normocephalic and atraumatic.     Right Ear: Tympanic membrane normal.     Left Ear: Tympanic membrane normal.  Eyes:     Pupils: Pupils are equal, round, and reactive to light.  Neck:     Thyroid: No thyromegaly.  Cardiovascular:     Rate and Rhythm: Normal rate and regular rhythm.     Heart sounds: Normal heart sounds. No murmur heard. Pulmonary:     Effort: Pulmonary effort is normal. No respiratory distress.     Breath sounds: Normal breath sounds. No  wheezing.  Abdominal:     General: Bowel sounds are normal. There is no distension.     Palpations: Abdomen is soft.     Tenderness: There is no abdominal tenderness.  Musculoskeletal:        General: No tenderness.     Cervical back: Normal range of motion and neck supple.     Comments: Pain with rotation of neck   Skin:    General: Skin is warm and dry.  Neurological:     Mental Status: She is alert and oriented to person, place, and time.     Cranial Nerves: No cranial nerve deficit.     Motor: Weakness (weakness in right arm in grip) present.     Deep Tendon Reflexes: Reflexes are normal and symmetric.  Psychiatric:        Behavior: Behavior normal.        Thought Content: Thought content normal.        Judgment: Judgment normal.     BP (!) 146/99   Pulse (!) 106   Temp (!) 97 F (36.1 C) (Temporal)   Ht 5\' 5"  (1.651 m)   Wt 120 lb (54.4 kg)   LMP 02/21/2023 (Approximate)   SpO2 96%   BMI 19.97 kg/m        Assessment & Plan:  Brandy Vega comes in  today with chief complaint of Hospitalization Follow-up (Fall )   Diagnosis and orders addressed:  1. Cervical radiculopathy Rest Start Ibuprofen 800 mg every 8 hours, no other NSAID's  ROM exercises  Baclofen as needed- sedation precautions  Gabapentin as needed  - ibuprofen (ADVIL) 800 MG tablet; Take 1 tablet (800 mg total) by mouth every 8 (eight) hours as needed for moderate pain.  Dispense: 30 tablet; Refill: 1 - gabapentin (NEURONTIN) 100 MG capsule; Take 1 capsule (100 mg total) by mouth 3 (three) times daily.  Dispense: 90 capsule; Refill: 0 - baclofen (LIORESAL) 10 MG tablet; Take 1 tablet (10 mg total) by mouth 3 (three) times daily.  Dispense: 90 each; Refill: 0 - methylPREDNISolone acetate (DEPO-MEDROL) injection 80 mg - ketorolac (TORADOL) injection 60 mg  2. Hospital discharge follow-up - baclofen (LIORESAL) 10 MG tablet; Take 1 tablet (10 mg total) by mouth 3 (three) times daily.  Dispense: 90  each; Refill: 0 - methylPREDNISolone acetate (DEPO-MEDROL) injection 80 mg - ketorolac (TORADOL) injection 60 mg  3. Fall down stairs, subsequent encounter - baclofen (LIORESAL) 10 MG tablet; Take 1 tablet (10 mg total) by mouth 3 (three) times daily.  Dispense: 90 each; Refill: 0 - methylPREDNISolone acetate (DEPO-MEDROL) injection 80 mg - ketorolac (TORADOL) injection 60 mg  4. Closed fracture of multiple ribs of right side with routine healing, subsequent encounter - baclofen (LIORESAL) 10 MG tablet; Take 1 tablet (10 mg total) by mouth 3 (three) times daily.  Dispense: 90 each; Refill: 0 - methylPREDNISolone acetate (DEPO-MEDROL) injection 80 mg - ketorolac (TORADOL) injection 60 mg      [name], FNP

## 2023-03-22 ENCOUNTER — Telehealth: Payer: Self-pay | Admitting: Family

## 2023-03-22 NOTE — Telephone Encounter (Signed)
Patient said she has been taking two gabapentin (NEURONTIN) 100 MG capsule even though PCP told her to take one and she wants to know if this is okay because it makes her feel better when she takes two. Patient has appt 7/29. Please call back and advise.

## 2023-03-23 ENCOUNTER — Ambulatory Visit: Payer: 59 | Admitting: Family

## 2023-03-26 ENCOUNTER — Encounter: Payer: Self-pay | Admitting: Family

## 2023-03-26 ENCOUNTER — Ambulatory Visit (INDEPENDENT_AMBULATORY_CARE_PROVIDER_SITE_OTHER): Payer: 59 | Admitting: Family

## 2023-03-26 VITALS — BP 133/91 | HR 92 | Temp 97.3°F | Ht 65.0 in | Wt 118.6 lb

## 2023-03-26 DIAGNOSIS — I10 Essential (primary) hypertension: Secondary | ICD-10-CM | POA: Diagnosis not present

## 2023-03-26 DIAGNOSIS — I471 Supraventricular tachycardia, unspecified: Secondary | ICD-10-CM

## 2023-03-26 DIAGNOSIS — F1011 Alcohol abuse, in remission: Secondary | ICD-10-CM

## 2023-03-26 DIAGNOSIS — F411 Generalized anxiety disorder: Secondary | ICD-10-CM

## 2023-03-26 DIAGNOSIS — F332 Major depressive disorder, recurrent severe without psychotic features: Secondary | ICD-10-CM

## 2023-03-26 DIAGNOSIS — F17200 Nicotine dependence, unspecified, uncomplicated: Secondary | ICD-10-CM

## 2023-03-26 DIAGNOSIS — Z1211 Encounter for screening for malignant neoplasm of colon: Secondary | ICD-10-CM

## 2023-03-26 DIAGNOSIS — M5412 Radiculopathy, cervical region: Secondary | ICD-10-CM | POA: Insufficient documentation

## 2023-03-26 MED ORDER — GABAPENTIN 300 MG PO CAPS: 300.0000 mg | ORAL_CAPSULE | Freq: Three times a day (TID) | ORAL | 3 refills | Status: AC

## 2023-03-26 NOTE — Telephone Encounter (Signed)
Will follow at todays appointment

## 2023-03-26 NOTE — Patient Instructions (Signed)
Cervical Radiculopathy  Cervical radiculopathy happens when a nerve in the neck (a cervical nerve) is pinched or bruised. This condition can happen because of an injury to the cervical spine (vertebrae) in the neck, or as part of the normal aging process. Pressure on the cervical nerves can cause pain or numbness that travels from the neck all the way down to the arm and fingers. This condition usually gets better with rest. Treatment may be needed if the condition does not improve. What are the causes? This condition may be caused by: A neck injury. A bulging (herniated) disk. Muscle spasms. Muscle tightness in the neck due to overuse. Arthritis. Breakdown or degeneration in the bones and joints of the spine (spondylosis) due to aging. Bone spurs that may develop near the cervical nerves. What are the signs or symptoms? Symptoms of this condition include: Pain. The pain may travel from the neck to the arm and hand. The pain can be severe or irritating. It may get worse when you move your neck. Numbness or tingling in your arm or hand. Weakness in the affected arm and hand, in severe cases. How is this diagnosed? This condition may be diagnosed based on your symptoms, your medical history, and a physical exam. You may also have tests, including: X-rays. CT scan. MRI. Electromyogram (EMG). Nerve conduction tests. How is this treated? In many cases, treatment is not needed for this condition. With rest, the condition usually gets better over time. If treatment is needed, options may include: Wearing a soft neck collar (cervical collar) for short periods of time. Doing physical therapy to strengthen your neck muscles. Taking medicines. These may include NSAIDs, such as ibuprofen, or oral corticosteroids. Having spinal injections, in severe cases. Having surgery. This may be needed if other treatments do not help. Different types of surgery may be done depending on the cause of this  condition. Follow these instructions at home: If you have a cervical collar: Wear it as told by your health care provider. Remove it only as told by your health care provider. Ask your health care provider if you can remove the cervical collar for cleaning and bathing. If you are allowed to remove the collar for cleaning or bathing: Follow instructions from your health care provider about how to remove the collar safely. Clean the collar by wiping it with mild soap and water and drying it completely. Take out any removable pads in the collar every 1-2 days, and wash them by hand with soap and water. Let them air-dry completely before you put them back in the collar. Check your skin under the collar for irritation or sores. If you see any, tell your health care provider. Managing pain     Take over-the-counter and prescription medicines only as told by your health care provider. If directed, put ice on the affected area. To do this: If you have a soft neck collar, remove it as told by your health care provider. Put ice in a plastic bag. Place a towel between your skin and the bag. Leave the ice on for 20 minutes, 2-3 times a day. Remove the ice if your skin turns bright red. This is very important. If you cannot feel pain, heat, or cold, you have a greater risk of damage to the area. If applying ice does not help, you can try using heat. Use the heat source that your health care provider recommends, such as a moist heat pack or a heating pad. Place a towel between   your skin and the heat source. Leave the heat on for 20-30 minutes. Remove the heat if your skin turns bright red. This is especially important if you are unable to feel pain, heat, or cold. You have a greater risk of getting burned. Try a gentle neck and shoulder massage to help relieve symptoms. Activity Rest as needed. Return to your normal activities as told by your health care provider. Ask your health care provider what  activities are safe for you. Do stretching and strengthening exercises as told by your health care provider or your physical therapist. You may have to avoid lifting. Ask your health care provider how much you can safely lift. General instructions Use a flat pillow when you sleep. Do not drive while wearing a cervical collar. If you do not have a cervical collar, ask your health care provider if it is safe to drive while your neck heals. Ask your health care provider if the medicine prescribed to you requires you to avoid driving or using machinery. Do not use any products that contain nicotine or tobacco. These products include cigarettes, chewing tobacco, and vaping devices, such as e-cigarettes. If you need help quitting, ask your health care provider. Keep all follow-up visits. This is important. Contact a health care provider if: Your condition does not improve with treatment. Get help right away if: Your pain gets much worse and is not controlled with medicines. You have weakness or numbness in your hand, arm, face, or leg. You have a high fever. You have a stiff, rigid neck. You lose control of your bowels or your bladder (have incontinence). You have trouble with walking, balance, or speaking. Summary Cervical radiculopathy happens when a nerve in the neck is pinched or bruised. A nerve can get pinched from a bulging disk, arthritis, muscle spasms, or an injury to the neck. Symptoms include pain, tingling, or numbness radiating from the neck to the arm or hand. Weakness can also occur in severe cases. Treatment may include rest, wearing a cervical collar, and physical therapy. Medicines may be prescribed to help with pain. In severe cases, injections or surgery may be needed. This information is not intended to replace advice given to you by your health care provider. Make sure you discuss any questions you have with your health care provider. Document Revised: 02/17/2021 Document  Reviewed: 02/17/2021 Elsevier Patient Education  2024 [employer]  

## 2023-03-26 NOTE — Progress Notes (Signed)
Subjective:    Patient ID: Brandy Vega, female    DOB: [birth year], 47 y.o.   MRN: [id number]  Chief Complaint  Patient presents with   Medical Management of Chronic Issues   Pain    Takes to of the gabapentin and it helps more   PT presents to the office today for chronic follow up.   She was seen last week for cervical radiculopathy after a fall. She was given baclofen and gabapentin that has greatly helped. Would like to increase her gabapentin. She was given Ketorolac and Depo Medrol IM in office.    She is currently taking Lexapro 20 mg, Wellbutrin 300 mg, and Buspar 7.5 mg BID prn. Reports her depression and anxiety has improved.    She was hospitalized for SI on 07/23/22 after drinking binge. She reports her father passed away several years ago, but that time was around his birthday and holidays.   She has hx of SVT and was recommend for ablation. She was too anxious and never had this done. She is followed by Cardiologists.    She reports the last time she drank was a month ago.    She has allergies and takes zyrtec as needed.  Hypertension This is a chronic problem. The current episode started more than 1 year ago. The problem has been resolved since onset. The problem is controlled. Associated symptoms include anxiety. Pertinent negatives include no malaise/fatigue, peripheral edema or shortness of breath. The current treatment provides moderate improvement.  Depression        This is a chronic problem.  The current episode started more than 1 year ago.   The problem occurs intermittently.  Associated symptoms include fatigue.  Associated symptoms include no helplessness, no hopelessness, not sad and no suicidal ideas.  Past treatments include SSRIs - Selective serotonin reuptake inhibitors.  Past medical history includes anxiety.   Anxiety Presents for follow-up visit. Symptoms include excessive worry, irritability and nervous/anxious behavior. Patient reports no shortness  of breath or suicidal ideas. Symptoms occur occasionally. The severity of symptoms is moderate.        Review of Systems  Constitutional:  Positive for fatigue and irritability. Negative for malaise/fatigue.  Respiratory:  Negative for shortness of breath.   Psychiatric/Behavioral:  Positive for depression. Negative for suicidal ideas. The patient is nervous/anxious.   All other systems reviewed and are negative.      Objective:   Physical Exam Vitals reviewed.  Constitutional:      General: She is not in acute distress.    Appearance: She is well-developed.  HENT:     Head: Normocephalic and atraumatic.     Right Ear: Tympanic membrane normal.     Left Ear: Tympanic membrane normal.  Eyes:     Pupils: Pupils are equal, round, and reactive to light.  Neck:     Thyroid: No thyromegaly.  Cardiovascular:     Rate and Rhythm: Normal rate and regular rhythm.     Heart sounds: Normal heart sounds. No murmur heard. Pulmonary:     Effort: Pulmonary effort is normal. No respiratory distress.     Breath sounds: Normal breath sounds. No wheezing.  Abdominal:     General: Bowel sounds are normal. There is no distension.     Palpations: Abdomen is soft.     Tenderness: There is no abdominal tenderness.  Musculoskeletal:        General: No tenderness. Normal range of motion.     Cervical back: Normal  range of motion and neck supple.  Skin:    General: Skin is warm and dry.  Neurological:     Mental Status: She is alert and oriented to person, place, and time.     Cranial Nerves: No cranial nerve deficit.     Deep Tendon Reflexes: Reflexes are normal and symmetric.  Psychiatric:        Behavior: Behavior normal.        Thought Content: Thought content normal.        Judgment: Judgment normal.       BP (!) 133/91   Pulse 92   Temp (!) 97.3 F (36.3 C)   Ht 5\' 5"  (1.651 m)   Wt 118 lb 9.6 oz (53.8 kg)   LMP 02/21/2023 (Approximate)   SpO2 96%   BMI 19.74 kg/m       Assessment & Plan:  Somone Vega comes in today with chief complaint of Medical Management of Chronic Issues and Pain (Takes to of the gabapentin and it helps more)   Diagnosis and orders addressed:  1. Severe episode of recurrent major depressive disorder, without psychotic features (HCC)  2. Essential hypertension  3. GAD (generalized anxiety disorder)  4. Alcohol abuse, in remission  5. Current smoker  6. SVT (supraventricular tachycardia)  7. Cervical radiculopathy - gabapentin (NEURONTIN) 300 MG capsule; Take 1 capsule (300 mg total) by mouth 3 (three) times daily.  Dispense: 90 capsule; Refill: 3  8. Colon cancer screening - Cologuard   Will increase Gabapentin to 300 mg from 100 mg  Sedation precautions  Offered referral to PT. Reports her work has a [employer].  Health Maintenance reviewed Diet and exercise encouraged  Follow up plan: 3 months    Brandy Rodney, FNP

## 2023-06-27 ENCOUNTER — Ambulatory Visit (INDEPENDENT_AMBULATORY_CARE_PROVIDER_SITE_OTHER): Payer: 59 | Admitting: Family Medicine

## 2023-06-27 ENCOUNTER — Encounter: Payer: Self-pay | Admitting: Family Medicine

## 2023-06-27 ENCOUNTER — Telehealth: Payer: Self-pay | Admitting: Family

## 2023-06-27 VITALS — BP 149/93 | HR 82 | Temp 97.3°F | Ht 65.5 in | Wt 112.6 lb

## 2023-06-27 DIAGNOSIS — I10 Essential (primary) hypertension: Secondary | ICD-10-CM

## 2023-06-27 DIAGNOSIS — R051 Acute cough: Secondary | ICD-10-CM | POA: Diagnosis not present

## 2023-06-27 DIAGNOSIS — Z0279 Encounter for issue of other medical certificate: Secondary | ICD-10-CM

## 2023-06-27 NOTE — Patient Instructions (Addendum)
Monitoring your BP at home   Your BP was elevated today in office  Please keep a log of your BP at home.  The best time to take BP is 1st thing in the morning after waking.  Sit for 5 minutes with feet flat on the floor, arm at heart level.  Options for BP cuffs are at [employer], [employer], [employer], [facility] & [facility].  We will review measurements at follow up and determine plan for BP management.  If you have access, you can send a message in MyChart with your measurements prior to your follow up appointment.  The brand I recommend to get is [employer] (Bronze).   Please bring in measurements or send in mychart in 2 weeks. You can bring in your device and we will compare it to ours.    It appears that you have a viral upper respiratory infection (cold).  Cold symptoms can last up to 2 weeks.  I recommend that you only use cold medications that are safe in high blood pressure like Coricidin (generic is fine).  Other cold medications can increase your blood pressure.    - Get plenty of rest and drink plenty of fluids. - Try to breathe moist air. Use a cold mist humidifier. - Consume warm fluids (soup or tea) to provide relief for a stuffy nose and to loosen phlegm. - For nasal stuffiness, try saline nasal spray or a Neti Pot.  Afrin nasal spray can also be used but this product should not be used longer than 3 days or it will cause rebound nasal stuffiness (worsening nasal congestion). - For sore throat pain relief: use chloraseptic spray, suck on throat lozenges, hard candy or popsicles; gargle with warm salt water (1/4 tsp. salt per 8 oz. of water); and eat soft, bland foods. - Eat a well-balanced diet. If you cannot, ensure you are getting enough nutrients by taking a daily multivitamin. - Avoid dairy products, as they can thicken phlegm. - Avoid alcohol, as it impairs your body's immune system.  CONTACT YOUR DOCTOR IF YOU EXPERIENCE ANY OF THE FOLLOWING: - High fever - Ear pain - Sinus-type  headache - Unusually severe cold symptoms - Cough that gets worse while other cold symptoms improve - Flare up of any chronic lung problem, such as asthma - Your symptoms persist longer than 2 weeks

## 2023-06-27 NOTE — Progress Notes (Signed)
Subjective:  Patient ID: Brandy Vega, female    DOB: 1976-06-04, 47 y.o.   MRN: [id number]  Patient Care Team: Junie Spencer, FNP as PCP - General (Family Medicine) Rollene Rotunda, MD as PCP - Cardiology (Cardiology)   Chief Complaint:  Cough (Congestion over a week )  HPI: Brandy Vega is a 47 y.o. female presenting on 06/27/2023 for Cough (Congestion over a week ) States that cold symptoms started a little over a week ago, feels that her symptoms are improving. Reports congestion, runny nose, cough and sore throat. Denies fever. Reports productive cough, green in color. Has been taking mucinex OTC. States that her BP started increasing recently with cold medicine. States that she checks it at home and had 150/100. Her BP prior to cold was averaging 130s systolic at home. 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 At that time, it was 160/100. She had blurry vision and lightheadedness at this time. She is no longer having dizziness, blurriness.    Relevant past medical, surgical, family, and social history reviewed and updated as indicated.  Allergies and medications reviewed and updated. Data reviewed: Chart in Epic.   Past Medical History:  Diagnosis Date   Bipolar 1 disorder (HCC)    Essential hypertension 09/05/2018   GAD (generalized anxiety disorder) 06/25/2019   Hypertriglyceridemia 07/25/2021   MDD (major depressive disorder), recurrent episode, severe (HCC) 03/18/2018    Past Surgical History:  Procedure Laterality Date   DILATION AND CURETTAGE OF UTERUS      Social History   Socioeconomic History   Marital status: Single    Spouse name: Not on file   Number of children: Not on file   Years of education: Not on file   Highest education level: Not on file  Occupational History   Not on file  Tobacco Use   Smoking status: Every Day    Current packs/day: 0.50    Types: Cigarettes   Smokeless tobacco: Never  Vaping Use    Vaping status: Never Used  Substance and Sexual Activity   Alcohol use: Yes    Comment: occ. use   Drug use: No   Sexual activity: Yes    Birth control/protection: None  Other Topics Concern   Not on file  Social History Narrative   Lives with boyfriend.     Social Determinants of Health   Financial Resource Strain: Not on file  Food Insecurity: Not on file  Transportation Needs: Not on file  Physical Activity: Not on file  Stress: Not on file  Social Connections: Not on file  Intimate Partner Violence: Not on file    Outpatient Encounter Medications as of 06/27/2023  Medication Sig   albuterol (VENTOLIN HFA) 108 (90 Base) MCG/ACT inhaler Inhale 2 puffs into the lungs every 4 (four) hours as needed for wheezing or shortness of breath.   baclofen (LIORESAL) 10 MG tablet Take 1 tablet (10 mg total) by mouth 3 (three) times daily.   buPROPion (WELLBUTRIN XL) 300 MG 24 hr tablet Take 1 tablet (300 mg total) by mouth daily.   busPIRone (BUSPAR) 7.5 MG tablet Take 1 tablet (7.5 mg total) by mouth 3 (three) times daily as needed.   cetirizine (ZYRTEC ALLERGY) 10 MG tablet Take 1 tablet (10 mg total) by mouth daily.   gabapentin (NEURONTIN) 300 MG capsule Take 1 capsule (300 mg total) by mouth 3 (three) times daily.   ibuprofen (ADVIL) 800 MG tablet Take 1  tablet (800 mg total) by mouth every 8 (eight) hours as needed for moderate pain.   Melatonin 10 MG TABS Take 10 mg by mouth daily as needed. (Patient taking differently: Take 10 mg by mouth daily as needed (sleep).)   escitalopram (LEXAPRO) 20 MG tablet Take 1 tablet (20 mg total) by mouth in the morning.   No facility-administered encounter medications on file as of 06/27/2023.    No Known Allergies  Review of Systems As per HPI  Objective:  BP (!) 149/93   Pulse 82   Temp (!) 97.3 F (36.3 C) (Temporal)   Ht 5' 5.5" (1.664 m)   Wt 112 lb 9.6 oz (51.1 kg)   SpO2 99%   BMI 18.45 kg/m    Wt Readings from Last 3  Encounters:  06/27/23 112 lb 9.6 oz (51.1 kg)  03/26/23 118 lb 9.6 oz (53.8 kg)  03/20/23 120 lb (54.4 kg)    Physical Exam Constitutional:      General: She is awake. She is not in acute distress.    Appearance: Normal appearance. She is well-developed, well-groomed and underweight. She is not ill-appearing, toxic-appearing or diaphoretic.  HENT:     Right Ear: No decreased hearing noted. No laceration, drainage, swelling or tenderness. A middle ear effusion is present. There is no impacted cerumen. No foreign body. No mastoid tenderness. No PE tube. No hemotympanum. Tympanic membrane is not injected, scarred, perforated, erythematous, retracted or bulging.     Left Ear: No decreased hearing noted. No laceration, drainage, swelling or tenderness. A middle ear effusion is present. There is no impacted cerumen. No foreign body. No mastoid tenderness. No PE tube. No hemotympanum. Tympanic membrane is not injected, scarred, perforated, erythematous, retracted or bulging.     Nose: Congestion present.     Mouth/Throat:     Lips: Pink. No lesions.     Mouth: Mucous membranes are moist.     Pharynx: Posterior oropharyngeal erythema present. No pharyngeal swelling, oropharyngeal exudate, uvula swelling or postnasal drip.     Tonsils: No tonsillar exudate or tonsillar abscesses.  Cardiovascular:     Rate and Rhythm: Normal rate and regular rhythm.     Pulses: Normal pulses.          Radial pulses are 2+ on the right side and 2+ on the left side.       Posterior tibial pulses are 2+ on the right side and 2+ on the left side.     Heart sounds: Normal heart sounds. No murmur heard.    No gallop.  Pulmonary:     Effort: Pulmonary effort is normal. No respiratory distress.     Breath sounds: Normal breath sounds. No stridor. No wheezing, rhonchi or rales.  Musculoskeletal:     Cervical back: Full passive range of motion without pain and neck supple.     Right lower leg: No edema.     Left lower  leg: No edema.  Lymphadenopathy:     Head:     Right side of head: No submental, submandibular, tonsillar, preauricular or posterior auricular adenopathy.     Left side of head: Tonsillar adenopathy present. No submental, submandibular, preauricular or posterior auricular adenopathy.  Skin:    General: Skin is warm.     Capillary Refill: Capillary refill takes less than 2 seconds.  Neurological:     General: No focal deficit present.     Mental Status: She is alert, oriented to person, place, and time and easily  aroused. Mental status is at baseline.     GCS: GCS eye subscore is 4. GCS verbal subscore is 5. GCS motor subscore is 6.     Motor: No weakness.  Psychiatric:        Attention and Perception: Attention and perception normal.        Mood and Affect: Mood and affect normal.        Speech: Speech normal.        Behavior: Behavior normal. Behavior is cooperative.        Thought Content: Thought content normal. Thought content does not include homicidal or suicidal ideation. Thought content does not include homicidal or suicidal plan.        Cognition and Memory: Cognition and memory normal.        Judgment: Judgment normal.     Results for orders placed or performed in visit on 11/03/22  Microscopic Examination   Urine  Result Value Ref Range   WBC, UA None seen 0 - 5 /hpf   RBC, Urine None seen 0 - 2 /hpf   Epithelial Cells (non renal) None seen 0 - 10 /hpf   Renal Epithel, UA None seen None seen /hpf   Bacteria, UA None seen None seen/Few  Urinalysis, Complete  Result Value Ref Range   Specific Gravity, UA 1.025 1.005 - 1.030   pH, UA 5.5 5.0 - 7.5   Color, UA Yellow Yellow   Appearance Ur Clear Clear   Leukocytes,UA Negative Negative   Protein,UA Negative Negative/Trace   Glucose, UA Negative Negative   Ketones, UA Negative Negative   RBC, UA Negative Negative   Bilirubin, UA Negative Negative   Urobilinogen, Ur 0.2 0.2 - 1.0 mg/dL   Nitrite, UA Negative Negative    Microscopic Examination See below:   CMP14+EGFR  Result Value Ref Range   Glucose 70 70 - 99 mg/dL   BUN 9 6 - 24 mg/dL   Creatinine, Ser 0.98 0.57 - 1.00 mg/dL   eGFR 94 >11 BJ/YNW/2.95   BUN/Creatinine Ratio 11 9 - 23   Sodium 138 134 - 144 mmol/L   Potassium 4.1 3.5 - 5.2 mmol/L   Chloride 100 96 - 106 mmol/L   CO2 18 (L) 20 - 29 mmol/L   Calcium 9.8 8.7 - 10.2 mg/dL   Total Protein 7.7 6.0 - 8.5 g/dL   Albumin 5.1 (H) 3.9 - 4.9 g/dL   Globulin, Total 2.6 1.5 - 4.5 g/dL   Albumin/Globulin Ratio 2.0 1.2 - 2.2   Bilirubin Total 0.3 0.0 - 1.2 mg/dL   Alkaline Phosphatase 67 44 - 121 IU/L   AST 21 0 - 40 IU/L   ALT 10 0 - 32 IU/L       06/27/2023    1:55 PM 03/26/2023   11:02 AM 12/22/2022   12:11 PM 12/22/2022   12:10 PM 11/03/2022   11:08 AM  Depression screen PHQ 2/9  Decreased Interest 1 1 1 1 1   Down, Depressed, Hopeless 1 1  1 1   PHQ - 2 Score 2 2 1 2 2   Altered sleeping 2 1  1 1   Tired, decreased energy 1 1  1 1   Change in appetite 1 1  0 1  Feeling bad or failure about yourself  1 1  1 1   Trouble concentrating 1 1  1 1   Moving slowly or fidgety/restless 0 0  0 1  Suicidal thoughts 0 0  0 0  PHQ-9 Score 8 7  6 8  Difficult doing work/chores Somewhat difficult Somewhat difficult  Somewhat difficult Somewhat difficult       06/27/2023    1:56 PM 03/26/2023   11:02 AM 12/22/2022   12:10 PM 11/03/2022   11:09 AM  GAD 7 : Generalized Anxiety Score  Nervous, Anxious, on Edge 2 1 2 2   Control/stop worrying 2 1 1 2   Worry too much - different things 2 1 1 2   Trouble relaxing 2 1 1 2   Restless 0 1 0 2  Easily annoyed or irritable 0 1 1 2   Afraid - awful might happen 0 1 0 1  Total GAD 7 Score 8 7 6 13   Anxiety Difficulty Somewhat difficult  Somewhat difficult Somewhat difficult      Pertinent labs & imaging results that were available during my care of the patient were reviewed by me and considered in my medical decision making.  Assessment & Plan:  Elaini  was seen today for cough.  Diagnoses and all orders for this visit:  Essential hypertension Not at goal in office. Elevated BP most likely related to recent use of OTC cold medications. Discussed with patient that will slowly restart if needed given history of hypotension and requiring discontinuation of antihypertensive. Patient to monitor BP at home for 2 weeks. Patient to bring machine to office to correlate measurements. Patient to bring BP measurements to triage RN in 2 weeks or send them through myChart. Scheduled patient for follow up with PCP in one month to monitor BP. Discussed with pt that if she is having symptoms of dizziness, vision changes especially with elevated BP that she should go to ED.   Acute cough Provided patient with information for OTC cold medication that will not impact BP. Discussed oral rehydration. Discussed that if patient is not improving to follow up.    Continue all other maintenance medications.  Follow up plan: Return in about 2 weeks (around 07/11/2023) for triage RN .   Continue healthy lifestyle choices, including diet (rich in fruits, vegetables, and lean proteins, and low in salt and simple carbohydrates) and exercise (at least 30 minutes of moderate physical activity daily).  Written and verbal instructions provided   The above assessment and management plan was discussed with the patient. The patient verbalized understanding of and has agreed to the management plan. Patient is aware to call the clinic if they develop any new symptoms or if symptoms persist or worsen. Patient is aware when to return to the clinic for a follow-up visit. Patient educated on when it is appropriate to go to the emergency department.   Neale Burly, DNP-FNP [facility] [address] [place], [place] [address] [phone]

## 2023-07-10 NOTE — Telephone Encounter (Signed)
PCP completed and signed FMLA forms. They have been faxed to [facility] at fax number [phone]. Patient has been contacted and informed they are complete, copy at front for pt.

## 2023-07-20 ENCOUNTER — Ambulatory Visit: Payer: 59 | Admitting: Family

## 2023-08-10 ENCOUNTER — Telehealth (INDEPENDENT_AMBULATORY_CARE_PROVIDER_SITE_OTHER): Payer: 59 | Admitting: Family

## 2023-08-10 ENCOUNTER — Encounter: Payer: Self-pay | Admitting: Family

## 2023-08-10 DIAGNOSIS — I10 Essential (primary) hypertension: Secondary | ICD-10-CM | POA: Diagnosis not present

## 2023-08-10 DIAGNOSIS — F17210 Nicotine dependence, cigarettes, uncomplicated: Secondary | ICD-10-CM

## 2023-08-10 DIAGNOSIS — F411 Generalized anxiety disorder: Secondary | ICD-10-CM | POA: Diagnosis not present

## 2023-08-10 DIAGNOSIS — F1011 Alcohol abuse, in remission: Secondary | ICD-10-CM | POA: Diagnosis not present

## 2023-08-10 DIAGNOSIS — F332 Major depressive disorder, recurrent severe without psychotic features: Secondary | ICD-10-CM | POA: Diagnosis not present

## 2023-08-10 DIAGNOSIS — M5412 Radiculopathy, cervical region: Secondary | ICD-10-CM

## 2023-08-10 DIAGNOSIS — R002 Palpitations: Secondary | ICD-10-CM

## 2023-08-10 DIAGNOSIS — F5104 Psychophysiologic insomnia: Secondary | ICD-10-CM

## 2023-08-10 DIAGNOSIS — I471 Supraventricular tachycardia, unspecified: Secondary | ICD-10-CM

## 2023-08-10 MED ORDER — IBUPROFEN 800 MG PO TABS: 800.0000 mg | ORAL_TABLET | Freq: Three times a day (TID) | ORAL | 1 refills | Status: AC | PRN

## 2023-08-10 MED ORDER — ESCITALOPRAM OXALATE 20 MG PO TABS: 20.0000 mg | ORAL_TABLET | Freq: Every morning | ORAL | 3 refills | Status: DC

## 2023-08-10 MED ORDER — BUPROPION HCL ER (XL) 300 MG PO TB24: 300.0000 mg | ORAL_TABLET | Freq: Every day | ORAL | 1 refills | Status: AC

## 2023-08-10 NOTE — Progress Notes (Signed)
Virtual Visit Consent   Brandy Vega, you are scheduled for a virtual visit with a [facility] provider today. Just as with appointments in the office, your consent must be obtained to participate. Your consent will be active for this visit and any virtual visit you may have with one of our providers in the next 365 days. If you have a MyChart account, a copy of this consent can be sent to you electronically.  As this is a virtual visit, video technology does not allow for your provider to perform a traditional examination. This may limit your provider's ability to fully assess your condition. If your provider identifies any concerns that need to be evaluated in person or the need to arrange testing (such as labs, EKG, etc.), we will make arrangements to do so. Although advances in technology are sophisticated, we cannot ensure that it will always work on either your end or our end. If the connection with a video visit is poor, the visit may have to be switched to a telephone visit. With either a video or telephone visit, we are not always able to ensure that we have a secure connection.  By engaging in this virtual visit, you consent to the provision of healthcare and authorize for your insurance to be billed (if applicable) for the services provided during this visit. Depending on your insurance coverage, you may receive a charge related to this service.  I need to obtain your verbal consent now. Are you willing to proceed with your visit today? [name] has provided verbal consent on 08/10/2023 for a virtual visit (video or telephone). [name], FNP  Date: 08/10/2023 3:45 PM  Virtual Visit via Video Note   I, [name], connected with  Brandy Vega  ([id number], [birth year]) on 08/10/23 at  3:10 PM EST by a video-enabled telemedicine application and verified that I am speaking with the correct person using two identifiers.  Location: Patient: Virtual Visit Location Patient:  Home Provider: Virtual Visit Location Provider: Home Office   I discussed the limitations of evaluation and management by telemedicine and the availability of in person appointments. The patient expressed understanding and agreed to proceed.    History of Present Illness: Brandy Vega is a 47 y.o. who identifies as a female who was assigned female at birth, and is being seen today for  chronic follow up.    She was seeing [facility], but hasn't seen them in the last few months.    She was hospitalized for SI on 07/23/22 after drinking binge. She reports her father passed away several years ago, but that time was around his birthday and holidays.    She reports the last time she drank was two weeks ago.   She has allergies and takes zyrtec as needed. Marland Kitchen  HPI: Depression        This is a chronic problem.  The current episode started more than 1 year ago.   The problem occurs intermittently.  The problem has been waxing and waning since onset.  Associated symptoms include fatigue, restlessness and sad.  Associated symptoms include no helplessness and no hopelessness.  Past treatments include SSRIs - Selective serotonin reuptake inhibitors.  Past medical history includes anxiety.   Anxiety Presents for follow-up visit. Symptoms include excessive worry, nervous/anxious behavior, palpitations and restlessness. Patient reports no shortness of breath. Symptoms occur most days. The severity of symptoms is moderate.    Hypertension This is a chronic problem. The problem has been resolved since  onset. Associated symptoms include anxiety, malaise/fatigue, neck pain and palpitations. Pertinent negatives include no peripheral edema or shortness of breath. The current treatment provides moderate improvement.  Nicotine Dependence Presents for follow-up visit. Symptoms include fatigue. Her urge triggers include company of smokers. The symptoms have been stable. She smokes < 1/2 a pack of cigarettes  per day.  Palpitations  This is a chronic problem. The current episode started more than 1 year ago. The problem occurs intermittently. The symptoms are aggravated by stress. Associated symptoms include anxiety and malaise/fatigue. Pertinent negatives include no shortness of breath. She has tried bed rest for the symptoms. The treatment provided mild relief.  Neck Pain  This is a recurrent problem. The current episode started more than 1 month ago. The problem occurs intermittently. The pain is at a severity of 0/10 (right now, but can flare up if she sleeps wrong). The patient is experiencing no pain.    Problems:  Patient Active Problem List   Diagnosis Date Noted   Cervical radiculopathy 03/26/2023   Substance induced mood disorder (HCC) 07/23/2022   SVT (supraventricular tachycardia) (HCC) 11/15/2021   Hypertriglyceridemia 07/25/2021   Palpitations 11/16/2020   Current smoker 09/17/2020   Chronic insomnia 03/31/2020   Alcohol abuse, in remission 02/25/2020   GAD (generalized anxiety disorder) 06/25/2019   Essential hypertension 09/05/2018   DUB (dysfunctional uterine bleeding) 05/16/2018   MDD (major depressive disorder), recurrent episode, severe (HCC) 03/18/2018    Allergies: No Known Allergies Medications:  Current Outpatient Medications:    albuterol (VENTOLIN HFA) 108 (90 Base) MCG/ACT inhaler, Inhale 2 puffs into the lungs every 4 (four) hours as needed for wheezing or shortness of breath., Disp: 1 each, Rfl: 0   buPROPion (WELLBUTRIN XL) 300 MG 24 hr tablet, Take 1 tablet (300 mg total) by mouth daily., Disp: 90 tablet, Rfl: 1   busPIRone (BUSPAR) 7.5 MG tablet, Take 1 tablet (7.5 mg total) by mouth 3 (three) times daily as needed., Disp: 270 tablet, Rfl: 1   cetirizine (ZYRTEC ALLERGY) 10 MG tablet, Take 1 tablet (10 mg total) by mouth daily., Disp: 90 tablet, Rfl: 1   escitalopram (LEXAPRO) 20 MG tablet, Take 1 tablet (20 mg total) by mouth in the morning., Disp: 90 tablet,  Rfl: 3   gabapentin (NEURONTIN) 300 MG capsule, Take 1 capsule (300 mg total) by mouth 3 (three) times daily., Disp: 90 capsule, Rfl: 3   ibuprofen (ADVIL) 800 MG tablet, Take 1 tablet (800 mg total) by mouth every 8 (eight) hours as needed for moderate pain (pain score 4-6)., Disp: 30 tablet, Rfl: 1   Melatonin 10 MG TABS, Take 10 mg by mouth daily as needed. (Patient taking differently: Take 10 mg by mouth daily as needed (sleep).), Disp: 90 tablet, Rfl: 1  Observations/Objective: Patient is well-developed, well-nourished in no acute distress.  Resting comfortably  at home.  Head is normocephalic, atraumatic.  No labored breathing.  Speech is clear and coherent with logical content.  Patient is alert and oriented at baseline.    Assessment and Plan: 1. Severe episode of recurrent major depressive disorder, without psychotic features (HCC) (Primary) - escitalopram (LEXAPRO) 20 MG tablet; Take 1 tablet (20 mg total) by mouth in the morning.  Dispense: 90 tablet; Refill: 3  2. Essential hypertension  3. GAD (generalized anxiety disorder) - buPROPion (WELLBUTRIN XL) 300 MG 24 hr tablet; Take 1 tablet (300 mg total) by mouth daily.  Dispense: 90 tablet; Refill: 1 - escitalopram (LEXAPRO) 20 MG tablet;  Take 1 tablet (20 mg total) by mouth in the morning.  Dispense: 90 tablet; Refill: 3  4. Alcohol abuse, in remission  5. Current smoker  6. Chronic insomnia  7. SVT (supraventricular tachycardia) (HCC)  8. Palpitations  9. Depression, recurrent (HCC)  10. Cervical radiculopathy - ibuprofen (ADVIL) 800 MG tablet; Take 1 tablet (800 mg total) by mouth every 8 (eight) hours as needed for moderate pain (pain score 4-6).  Dispense: 30 tablet; Refill: 1  Continue current medications  Encouraged her to make follow up with Cardiologists  Continue to avoid alcohol  Follow up in 3 months   Follow Up Instructions: I discussed the assessment and treatment plan with the patient. The patient  was provided an opportunity to ask questions and all were answered. The patient agreed with the plan and demonstrated an understanding of the instructions.  A copy of instructions were sent to the patient via MyChart unless otherwise noted below.     The patient was advised to call back or seek an in-person evaluation if the symptoms worsen or if the condition fails to improve as anticipated.    [name], FNP

## 2023-09-14 ENCOUNTER — Encounter: Payer: Self-pay | Admitting: Family

## 2023-09-14 ENCOUNTER — Telehealth (INDEPENDENT_AMBULATORY_CARE_PROVIDER_SITE_OTHER): Payer: 59 | Admitting: Family

## 2023-09-14 DIAGNOSIS — J069 Acute upper respiratory infection, unspecified: Secondary | ICD-10-CM

## 2023-09-14 DIAGNOSIS — R52 Pain, unspecified: Secondary | ICD-10-CM | POA: Diagnosis not present

## 2023-09-14 DIAGNOSIS — R6889 Other general symptoms and signs: Secondary | ICD-10-CM

## 2023-09-14 LAB — VERITOR FLU A/B WAIVED
Influenza A: NEGATIVE
Influenza B: NEGATIVE

## 2023-09-14 MED ORDER — CETIRIZINE HCL 10 MG PO TABS: 10.0000 mg | ORAL_TABLET | Freq: Every day | ORAL | 1 refills | Status: AC

## 2023-09-14 MED ORDER — ONDANSETRON HCL 4 MG PO TABS: 4.0000 mg | ORAL_TABLET | Freq: Three times a day (TID) | ORAL | 0 refills | Status: AC | PRN

## 2023-09-14 MED ORDER — FLUTICASONE PROPIONATE 50 MCG/ACT NA SUSP: 2.0000 | Freq: Every day | NASAL | 6 refills | Status: AC

## 2023-09-14 MED ORDER — BENZONATATE 200 MG PO CAPS: 200.0000 mg | ORAL_CAPSULE | Freq: Three times a day (TID) | ORAL | 1 refills | Status: DC | PRN

## 2023-09-14 NOTE — Patient Instructions (Signed)

## 2023-09-14 NOTE — Progress Notes (Signed)
Virtual Visit Consent   Brandy Vega, you are scheduled for a virtual visit with a [facility] provider today. Just as with appointments in the office, your consent must be obtained to participate. Your consent will be active for this visit and any virtual visit you may have with one of our providers in the next 365 days. If you have a MyChart account, a copy of this consent can be sent to you electronically.  As this is a virtual visit, video technology does not allow for your provider to perform a traditional examination. This may limit your provider's ability to fully assess your condition. If your provider identifies any concerns that need to be evaluated in person or the need to arrange testing (such as labs, EKG, etc.), we will make arrangements to do so. Although advances in technology are sophisticated, we cannot ensure that it will always work on either your end or our end. If the connection with a video visit is poor, the visit may have to be switched to a telephone visit. With either a video or telephone visit, we are not always able to ensure that we have a secure connection.  By engaging in this virtual visit, you consent to the provision of healthcare and authorize for your insurance to be billed (if applicable) for the services provided during this visit. Depending on your insurance coverage, you may receive a charge related to this service.  I need to obtain your verbal consent now. Are you willing to proceed with your visit today? [name] has provided verbal consent on 09/14/2023 for a virtual visit (video or telephone). [name], FNP  Date: 09/14/2023 3:15 PM  Virtual Visit via Video Note   I, [name], connected with  Brandy Vega  ([id number], April 07, 1976) on 09/14/23 at  3:25 PM EST by a video-enabled telemedicine application and verified that I am speaking with the correct person using two identifiers.  Location: Patient: Virtual Visit Location Patient:  Home Provider: Virtual Visit Location Provider: Home Office   I discussed the limitations of evaluation and management by telemedicine and the availability of in person appointments. The patient expressed understanding and agreed to proceed.    History of Present Illness: Brandy Vega is a 48 y.o. who identifies as a female who was assigned female at birth, and is being seen today for cough, body aches, SOB that started 3 days ago. Had a negative COVID test.   HPI: URI  This is a new problem. The current episode started in the past 7 days. The problem has been gradually worsening. There has been no fever. Associated symptoms include congestion, coughing, diarrhea, headaches, joint pain, rhinorrhea, sinus pain and sneezing. Pertinent negatives include no dysuria, ear pain, sore throat or vomiting (did vomit once, but has resolved now). She has tried decongestant for the symptoms. The treatment provided mild relief.    Problems:  Patient Active Problem List   Diagnosis Date Noted   Cervical radiculopathy 03/26/2023   Substance induced mood disorder (HCC) 07/23/2022   SVT (supraventricular tachycardia) (HCC) 11/15/2021   Hypertriglyceridemia 07/25/2021   Palpitations 11/16/2020   Current smoker 09/17/2020   Chronic insomnia 03/31/2020   Alcohol abuse, in remission 02/25/2020   GAD (generalized anxiety disorder) 06/25/2019   Essential hypertension 09/05/2018   DUB (dysfunctional uterine bleeding) 05/16/2018   MDD (major depressive disorder), recurrent episode, severe (HCC) 03/18/2018    Allergies: No Known Allergies Medications:  Current Outpatient Medications:    benzonatate (TESSALON) 200 MG capsule, Take  1 capsule (200 mg total) by mouth 3 (three) times daily as needed., Disp: 30 capsule, Rfl: 1   cetirizine (ZYRTEC ALLERGY) 10 MG tablet, Take 1 tablet (10 mg total) by mouth daily., Disp: 90 tablet, Rfl: 1   fluticasone (FLONASE) 50 MCG/ACT nasal spray, Place 2 sprays into both  nostrils daily., Disp: 16 g, Rfl: 6   ondansetron (ZOFRAN) 4 MG tablet, Take 1 tablet (4 mg total) by mouth every 8 (eight) hours as needed for nausea or vomiting., Disp: 20 tablet, Rfl: 0   albuterol (VENTOLIN HFA) 108 (90 Base) MCG/ACT inhaler, Inhale 2 puffs into the lungs every 4 (four) hours as needed for wheezing or shortness of breath., Disp: 1 each, Rfl: 0   buPROPion (WELLBUTRIN XL) 300 MG 24 hr tablet, Take 1 tablet (300 mg total) by mouth daily., Disp: 90 tablet, Rfl: 1   busPIRone (BUSPAR) 7.5 MG tablet, Take 1 tablet (7.5 mg total) by mouth 3 (three) times daily as needed., Disp: 270 tablet, Rfl: 1   escitalopram (LEXAPRO) 20 MG tablet, Take 1 tablet (20 mg total) by mouth in the morning., Disp: 90 tablet, Rfl: 3   gabapentin (NEURONTIN) 300 MG capsule, Take 1 capsule (300 mg total) by mouth 3 (three) times daily., Disp: 90 capsule, Rfl: 3   ibuprofen (ADVIL) 800 MG tablet, Take 1 tablet (800 mg total) by mouth every 8 (eight) hours as needed for moderate pain (pain score 4-6)., Disp: 30 tablet, Rfl: 1   Melatonin 10 MG TABS, Take 10 mg by mouth daily as needed. (Patient taking differently: Take 10 mg by mouth daily as needed (sleep).), Disp: 90 tablet, Rfl: 1  Observations/Objective: Patient is well-developed, well-nourished in no acute distress.  Resting comfortably  at home.  Head is normocephalic, atraumatic.  No labored breathing.  Speech is clear and coherent with logical content.  Patient is alert and oriented at baseline.  Nasal congestion, dry nonproductive cough  Assessment and Plan: 1. Body aches (Primary) - Veritor Flu A/B Waived  2. Flu-like symptoms - ondansetron (ZOFRAN) 4 MG tablet; Take 1 tablet (4 mg total) by mouth every 8 (eight) hours as needed for nausea or vomiting.  Dispense: 20 tablet; Refill: 0 - fluticasone (FLONASE) 50 MCG/ACT nasal spray; Place 2 sprays into both nostrils daily.  Dispense: 16 g; Refill: 6 - cetirizine (ZYRTEC ALLERGY) 10 MG tablet;  Take 1 tablet (10 mg total) by mouth daily.  Dispense: 90 tablet; Refill: 1 - benzonatate (TESSALON) 200 MG capsule; Take 1 capsule (200 mg total) by mouth 3 (three) times daily as needed.  Dispense: 30 capsule; Refill: 1  3. Viral URI - ondansetron (ZOFRAN) 4 MG tablet; Take 1 tablet (4 mg total) by mouth every 8 (eight) hours as needed for nausea or vomiting.  Dispense: 20 tablet; Refill: 0 - fluticasone (FLONASE) 50 MCG/ACT nasal spray; Place 2 sprays into both nostrils daily.  Dispense: 16 g; Refill: 6 - cetirizine (ZYRTEC ALLERGY) 10 MG tablet; Take 1 tablet (10 mg total) by mouth daily.  Dispense: 90 tablet; Refill: 1 - benzonatate (TESSALON) 200 MG capsule; Take 1 capsule (200 mg total) by mouth 3 (three) times daily as needed.  Dispense: 30 capsule; Refill: 1  - Take meds as prescribed - Use a cool mist humidifier  -Use saline nose sprays frequently -Force fluids -For any cough or congestion  Use plain Mucinex- regular strength or max strength is fine -For fever or aces or pains- take tylenol or ibuprofen. -Throat lozenges if help  Work note given  Follow up if symptoms worsen or do not improve    Follow Up Instructions: I discussed the assessment and treatment plan with the patient. The patient was provided an opportunity to ask questions and all were answered. The patient agreed with the plan and demonstrated an understanding of the instructions.  A copy of instructions were sent to the patient via MyChart unless otherwise noted below.     The patient was advised to call back or seek an in-person evaluation if the symptoms worsen or if the condition fails to improve as anticipated.    [name], FNP

## 2023-10-03 ENCOUNTER — Ambulatory Visit (INDEPENDENT_AMBULATORY_CARE_PROVIDER_SITE_OTHER): Payer: 59

## 2023-10-03 DIAGNOSIS — I10 Essential (primary) hypertension: Secondary | ICD-10-CM

## 2023-10-03 NOTE — Progress Notes (Addendum)
 Pt came into the office - states she was feeling dizzy   Checked her BP   1st time it was 153/95   2nd time 143/96   O2 100 HR 107  Advised pt to keep a check on bp at home and if it were to get to high she would need to go to urgent care or ed  Got her a apt with pcp in am

## 2023-10-04 ENCOUNTER — Encounter: Payer: Self-pay | Admitting: Family

## 2023-10-04 ENCOUNTER — Ambulatory Visit (INDEPENDENT_AMBULATORY_CARE_PROVIDER_SITE_OTHER): Payer: 59

## 2023-10-04 VITALS — BP 135/88 | HR 90 | Temp 97.0°F | Ht 65.5 in | Wt 115.0 lb

## 2023-10-04 DIAGNOSIS — I10 Essential (primary) hypertension: Secondary | ICD-10-CM | POA: Diagnosis not present

## 2023-10-04 MED ORDER — AMLODIPINE BESYLATE 5 MG PO TABS: 5.0000 mg | ORAL_TABLET | Freq: Every day | ORAL | 2 refills | Status: AC

## 2023-10-04 NOTE — Patient Instructions (Signed)
 Hypertension, Adult High blood pressure (hypertension) is when the force of blood pumping through the arteries is too strong. The arteries are the blood vessels that carry blood from the heart throughout the body. Hypertension forces the heart to work harder to pump blood and may cause arteries to become narrow or stiff. Untreated or uncontrolled hypertension can lead to a heart attack, heart failure, a stroke, kidney disease, and other problems. A blood pressure reading consists of a higher number over a lower number. Ideally, your blood pressure should be below 120/80. The first ("top") number is called the systolic pressure. It is a measure of the pressure in your arteries as your heart beats. The second ("bottom") number is called the diastolic pressure. It is a measure of the pressure in your arteries as the heart relaxes. What are the causes? The exact cause of this condition is not known. There are some conditions that result in high blood pressure. What increases the risk? Certain factors may make you more likely to develop high blood pressure. Some of these risk factors are under your control, including: Smoking. Not getting enough exercise or physical activity. Being overweight. Having too much fat, sugar, calories, or salt (sodium) in your diet. Drinking too much alcohol. Other risk factors include: Having a personal history of heart disease, diabetes, high cholesterol, or kidney disease. Stress. Having a family history of high blood pressure and high cholesterol. Having obstructive sleep apnea. Age. The risk increases with age. What are the signs or symptoms? High blood pressure may not cause symptoms. Very high blood pressure (hypertensive crisis) may cause: Headache. Fast or irregular heartbeats (palpitations). Shortness of breath. Nosebleed. Nausea and vomiting. Vision changes. Severe chest pain, dizziness, and seizures. How is this diagnosed? This condition is diagnosed by  measuring your blood pressure while you are seated, with your arm resting on a flat surface, your legs uncrossed, and your feet flat on the floor. The cuff of the blood pressure monitor will be placed directly against the skin of your upper arm at the level of your heart. Blood pressure should be measured at least twice using the same arm. Certain conditions can cause a difference in blood pressure between your right and left arms. If you have a high blood pressure reading during one visit or you have normal blood pressure with other risk factors, you may be asked to: Return on a different day to have your blood pressure checked again. Monitor your blood pressure at home for 1 week or longer. If you are diagnosed with hypertension, you may have other blood or imaging tests to help your health care provider understand your overall risk for other conditions. How is this treated? This condition is treated by making healthy lifestyle changes, such as eating healthy foods, exercising more, and reducing your alcohol intake. You may be referred for counseling on a healthy diet and physical activity. Your health care provider may prescribe medicine if lifestyle changes are not enough to get your blood pressure under control and if: Your systolic blood pressure is above 130. Your diastolic blood pressure is above 80. Your personal target blood pressure may vary depending on your medical conditions, your age, and other factors. Follow these instructions at home: Eating and drinking  Eat a diet that is high in fiber and potassium, and low in sodium, added sugar, and fat. An example of this eating plan is called the DASH diet. DASH stands for Dietary Approaches to Stop Hypertension. To eat this way: Eat  plenty of fresh fruits and vegetables. Try to fill one half of your plate at each meal with fruits and vegetables. Eat whole grains, such as whole-wheat pasta, brown rice, or whole-grain bread. Fill about one  fourth of your plate with whole grains. Eat or drink low-fat dairy products, such as skim milk or low-fat yogurt. Avoid fatty cuts of meat, processed or cured meats, and poultry with skin. Fill about one fourth of your plate with lean proteins, such as fish, chicken without skin, beans, eggs, or tofu. Avoid pre-made and processed foods. These tend to be higher in sodium, added sugar, and fat. Reduce your daily sodium intake. Many people with hypertension should eat less than 1,500 mg of sodium a day. Do not drink alcohol if: Your health care provider tells you not to drink. You are pregnant, may be pregnant, or are planning to become pregnant. If you drink alcohol: Limit how much you have to: 0-1 drink a day for women. 0-2 drinks a day for men. Know how much alcohol is in your drink. In the [place] one drink equals one 12 oz bottle of beer (355 mL), one 5 oz glass of wine (148 mL), or one 1 oz glass of hard liquor (44 mL). Lifestyle  Work with your health care provider to maintain a healthy body weight or to lose weight. Ask what an ideal weight is for you. Get at least 30 minutes of exercise that causes your heart to beat faster (aerobic exercise) most days of the week. Activities may include walking, swimming, or biking. Include exercise to strengthen your muscles (resistance exercise), such as Pilates or lifting weights, as part of your weekly exercise routine. Try to do these types of exercises for 30 minutes at least 3 days a week. Do not use any products that contain nicotine or tobacco. These products include cigarettes, chewing tobacco, and vaping devices, such as e-cigarettes. If you need help quitting, ask your health care provider. Monitor your blood pressure at home as told by your health care provider. Keep all follow-up visits. This is important. Medicines Take over-the-counter and prescription medicines only as told by your health care provider. Follow directions carefully. Blood  pressure medicines must be taken as prescribed. Do not skip doses of blood pressure medicine. Doing this puts you at risk for problems and can make the medicine less effective. Ask your health care provider about side effects or reactions to medicines that you should watch for. Contact a health care provider if you: Think you are having a reaction to a medicine you are taking. Have headaches that keep coming back (recurring). Feel dizzy. Have swelling in your ankles. Have trouble with your vision. Get help right away if you: Develop a severe headache or confusion. Have unusual weakness or numbness. Feel faint. Have severe pain in your chest or abdomen. Vomit repeatedly. Have trouble breathing. These symptoms may be an emergency. Get help right away. Call 911. Do not wait to see if the symptoms will go away. Do not drive yourself to the hospital. Summary Hypertension is when the force of blood pumping through your arteries is too strong. If this condition is not controlled, it may put you at risk for serious complications. Your personal target blood pressure may vary depending on your medical conditions, your age, and other factors. For most people, a normal blood pressure is less than 120/80. Hypertension is treated with lifestyle changes, medicines, or a combination of both. Lifestyle changes include losing weight, eating a healthy,  low-sodium diet, exercising more, and limiting alcohol. This information is not intended to replace advice given to you by your health care provider. Make sure you discuss any questions you have with your health care provider. Document Revised: 06/21/2021 Document Reviewed: 06/21/2021 Elsevier Patient Education  2024 [employer].

## 2023-10-04 NOTE — Progress Notes (Signed)
 Subjective:    Patient ID: Brandy Vega, female    DOB: 1975/12/13, 48 y.o.   MRN: [id number]  Chief Complaint  Patient presents with   Hypertension    Went to urgent care    PT presents to the office today with elevated BP. She reports her home BP has been 179/113, 156/103,171/112, and 169/117. She went to the Urgent Care yesterday and was started on Amlodipine  5 mg. Her BP is at goal today!  Hypertension This is a new problem. The current episode started 1 to 4 weeks ago. The problem has been waxing and waning since onset. The problem is controlled. Associated symptoms include blurred vision and malaise/fatigue. Pertinent negatives include no chest pain, headaches, peripheral edema, PND or shortness of breath. Past treatments include calcium channel blockers. The current treatment provides moderate improvement.      Review of Systems  Constitutional:  Positive for malaise/fatigue.  Eyes:  Positive for blurred vision.  Respiratory:  Negative for shortness of breath.   Cardiovascular:  Negative for chest pain and PND.  Neurological:  Negative for headaches.  All other systems reviewed and are negative.   Social History   Socioeconomic History   Marital status: Single    Spouse name: Not on file   Number of children: Not on file   Years of education: Not on file   Highest education level: Not on file  Occupational History   Not on file  Tobacco Use   Smoking status: Every Day    Current packs/day: 0.50    Types: Cigarettes   Smokeless tobacco: Never  Vaping Use   Vaping status: Never Used  Substance and Sexual Activity   Alcohol use: Yes    Comment: occ. use   Drug use: No   Sexual activity: Yes    Birth control/protection: None  Other Topics Concern   Not on file  Social History Narrative   Lives with boyfriend.     Social Drivers of Corporate Investment Banker Strain: Not on file  Food Insecurity: Not on file  Transportation Needs: Not on file  Physical  Activity: Not on file  Stress: Not on file  Social Connections: Not on file   Family History  Problem Relation Age of Onset   Hypertension Mother    Diabetes Father         Objective:   Physical Exam Vitals reviewed.  Constitutional:      General: She is not in acute distress.    Appearance: She is well-developed.  HENT:     Head: Normocephalic and atraumatic.     Right Ear: Tympanic membrane normal.     Left Ear: Tympanic membrane normal.  Eyes:     Pupils: Pupils are equal, round, and reactive to light.  Neck:     Thyroid: No thyromegaly.  Cardiovascular:     Rate and Rhythm: Regular rhythm. Tachycardia present.     Heart sounds: Normal heart sounds. No murmur heard. Pulmonary:     Effort: Pulmonary effort is normal. No respiratory distress.     Breath sounds: Normal breath sounds. No wheezing.  Abdominal:     General: Bowel sounds are normal. There is no distension.     Palpations: Abdomen is soft.     Tenderness: There is no abdominal tenderness.  Musculoskeletal:        General: No tenderness. Normal range of motion.     Cervical back: Normal range of motion and neck supple.  Skin:  General: Skin is warm and dry.  Neurological:     Mental Status: She is alert and oriented to person, place, and time.     Cranial Nerves: No cranial nerve deficit.     Deep Tendon Reflexes: Reflexes are normal and symmetric.  Psychiatric:        Behavior: Behavior normal.        Thought Content: Thought content normal.        Judgment: Judgment normal.       BP 135/88   Pulse 90   Temp (!) 97 F (36.1 C) (Temporal)   Ht 5' 5.5 (1.664 m)   Wt 115 lb (52.2 kg)   SpO2 98%   BMI 18.85 kg/m      Assessment & Plan:  Brandy Vega comes in today with chief complaint of Hypertension (Went to urgent care )   Diagnosis and orders addressed:  1. Primary hypertension (Primary) Continue Amlodipine  5 mg  Continue to monitor BP at home several times a week -Dash diet  information given -Exercise encouraged - Stress Management  -Continue current meds -RTO in 1 month  - amLODipine  (NORVASC ) 5 MG tablet; Take 1 tablet (5 mg total) by mouth daily.  Dispense: 90 tablet; Refill: 2   Brandy Learn, FNP

## 2023-12-24 ENCOUNTER — Encounter: Payer: Self-pay | Admitting: Cardiology

## 2024-03-21 ENCOUNTER — Telehealth: Payer: Self-pay | Admitting: Family

## 2024-03-21 DIAGNOSIS — Z0279 Encounter for issue of other medical certificate: Secondary | ICD-10-CM

## 2024-03-21 NOTE — Telephone Encounter (Signed)
 pt dropped off fmla forms to be completed and signed.  Form Fee Paid? (Y/N)   yes         If NO, form is placed on front office manager desk to hold until payment received. If YES, then form will be placed in the RX/HH Nurse Coordinators box for completion.  Form will not be processed until payment is received

## 2024-03-31 NOTE — Telephone Encounter (Signed)
 NA/VM not setup FMLA paperwork faxed to [place] at [phone]. Copy at front desk

## 2024-04-04 ENCOUNTER — Ambulatory Visit: Admitting: Family

## 2024-04-04 ENCOUNTER — Encounter: Payer: Self-pay | Admitting: Family

## 2024-04-04 ENCOUNTER — Telehealth: Payer: Self-pay

## 2024-04-04 ENCOUNTER — Other Ambulatory Visit (HOSPITAL_COMMUNITY): Payer: Self-pay

## 2024-04-04 VITALS — BP 113/72 | HR 68 | Temp 98.2°F | Ht 65.5 in | Wt 119.0 lb

## 2024-04-04 DIAGNOSIS — Z0000 Encounter for general adult medical examination without abnormal findings: Secondary | ICD-10-CM

## 2024-04-04 DIAGNOSIS — F332 Major depressive disorder, recurrent severe without psychotic features: Secondary | ICD-10-CM

## 2024-04-04 DIAGNOSIS — Z0001 Encounter for general adult medical examination with abnormal findings: Secondary | ICD-10-CM | POA: Diagnosis not present

## 2024-04-04 DIAGNOSIS — F411 Generalized anxiety disorder: Secondary | ICD-10-CM

## 2024-04-04 DIAGNOSIS — F1994 Other psychoactive substance use, unspecified with psychoactive substance-induced mood disorder: Secondary | ICD-10-CM | POA: Diagnosis not present

## 2024-04-04 DIAGNOSIS — F1011 Alcohol abuse, in remission: Secondary | ICD-10-CM

## 2024-04-04 DIAGNOSIS — I10 Essential (primary) hypertension: Secondary | ICD-10-CM

## 2024-04-04 DIAGNOSIS — M5412 Radiculopathy, cervical region: Secondary | ICD-10-CM

## 2024-04-04 DIAGNOSIS — Z1211 Encounter for screening for malignant neoplasm of colon: Secondary | ICD-10-CM

## 2024-04-04 DIAGNOSIS — F17200 Nicotine dependence, unspecified, uncomplicated: Secondary | ICD-10-CM

## 2024-04-04 LAB — LIPID PANEL

## 2024-04-04 MED ORDER — DESVENLAFAXINE SUCCINATE ER 100 MG PO TB24: 100.0000 mg | ORAL_TABLET | Freq: Every day | ORAL | 1 refills | Status: AC

## 2024-04-04 NOTE — Patient Instructions (Signed)

## 2024-04-04 NOTE — Progress Notes (Signed)
 Subjective:    Patient ID: Brandy Vega, female    DOB: Jun 07, 1976, 48 y.o.   MRN: [id number]  Chief Complaint  Patient presents with   Medical Management of Chronic Issues    Hypertension f/u   PT presents to the office today for CPE without pap.    She is currently taking Lexapro  20 mg, Wellbutrin  300 mg, and Buspar  7.5 mg BID prn. Reports her depression and anxiety has improved.    She was hospitalized for SI on 07/23/22 after drinking binge. She reports her father passed away several years ago, but that time was around his birthday and holidays.   She has hx of SVT and was recommend for ablation. She was too anxious and never had this done. She is followed by Cardiologists.    She reports the last time she drank was November 21 2023. She completed rehab in April 2025.    She has allergies and takes zyrtec  as needed.  Hypertension This is a chronic problem. The current episode started more than 1 year ago. The problem has been resolved since onset. The problem is controlled. Associated symptoms include anxiety and palpitations. Pertinent negatives include no malaise/fatigue, peripheral edema or shortness of breath. Risk factors for coronary artery disease include sedentary lifestyle, smoking/tobacco exposure and dyslipidemia. The current treatment provides moderate improvement.  Depression        This is a chronic problem.  The current episode started more than 1 year ago.   The problem occurs intermittently.  Associated symptoms include fatigue, hopelessness and sad.  Associated symptoms include no helplessness and no suicidal ideas.  Past treatments include SSRIs - Selective serotonin reuptake inhibitors.  Past medical history includes anxiety.   Anxiety Presents for follow-up visit. Symptoms include excessive worry, irritability, nervous/anxious behavior and palpitations. Patient reports no shortness of breath or suicidal ideas. Symptoms occur most days. The severity of symptoms is  moderate.        Review of Systems  Constitutional:  Positive for fatigue and irritability. Negative for malaise/fatigue.  Respiratory:  Negative for shortness of breath.   Cardiovascular:  Positive for palpitations.  Psychiatric/Behavioral:  Negative for suicidal ideas. The patient is nervous/anxious.   All other systems reviewed and are negative.  Family History  Problem Relation Age of Onset   Hypertension Mother    Diabetes Father    Social History   Socioeconomic History   Marital status: Single    Spouse name: Not on file   Number of children: Not on file   Years of education: Not on file   Highest education level: Not on file  Occupational History   Not on file  Tobacco Use   Smoking status: Every Day    Current packs/day: 0.50    Types: Cigarettes   Smokeless tobacco: Never  Vaping Use   Vaping status: Never Used  Substance and Sexual Activity   Alcohol use: Yes    Comment: occ. use   Drug use: No   Sexual activity: Yes    Birth control/protection: None  Other Topics Concern   Not on file  Social History Narrative   Lives with boyfriend.     Social Drivers of Corporate investment banker Strain: Not on file  Food Insecurity: No Food Insecurity (04/04/2024)   Hunger Vital Sign    Worried About Running Out of Food in the Last Year: Never true    Ran Out of Food in the Last Year: Never true  Transportation Needs:  No Transportation Needs (04/04/2024)   PRAPARE - Administrator, Civil Service (Medical): No    Lack of Transportation (Non-Medical): No  Physical Activity: Not on file  Stress: Not on file  Social Connections: Not on file       Objective:   Physical Exam Vitals reviewed.  Constitutional:      General: She is not in acute distress.    Appearance: She is well-developed.  HENT:     Head: Normocephalic and atraumatic.     Right Ear: Tympanic membrane normal.     Left Ear: Tympanic membrane normal.  Eyes:     Pupils: Pupils  are equal, round, and reactive to light.  Neck:     Thyroid: No thyromegaly.  Cardiovascular:     Rate and Rhythm: Normal rate and regular rhythm.     Heart sounds: Normal heart sounds. No murmur heard. Pulmonary:     Effort: Pulmonary effort is normal. No respiratory distress.     Breath sounds: Normal breath sounds. No wheezing.  Abdominal:     General: Bowel sounds are normal. There is no distension.     Palpations: Abdomen is soft.     Tenderness: There is no abdominal tenderness.  Musculoskeletal:        General: No tenderness. Normal range of motion.     Cervical back: Normal range of motion and neck supple.  Skin:    General: Skin is warm and dry.  Neurological:     Mental Status: She is alert and oriented to person, place, and time.     Cranial Nerves: No cranial nerve deficit.     Deep Tendon Reflexes: Reflexes are normal and symmetric.  Psychiatric:        Behavior: Behavior normal.        Thought Content: Thought content normal.        Judgment: Judgment normal.       BP 113/72   Pulse 68   Temp 98.2 F (36.8 C)   Ht 5' 5.5 (1.664 m)   Wt 119 lb (54 kg)   LMP 03/24/2024 (Approximate)   SpO2 99%   BMI 19.50 kg/m      Assessment & Plan:  Brandy Vega comes in today with chief complaint of Medical Management of Chronic Issues (Hypertension f/u)   Diagnosis and orders addressed:  1. Severe episode of recurrent major depressive disorder, without psychotic features (HCC) - desvenlafaxine  (PRISTIQ ) 100 MG 24 hr tablet; Take 1 tablet (100 mg total) by mouth daily.  Dispense: 90 tablet; Refill: 1 - CMP14+EGFR  2. Essential hypertension - CMP14+EGFR - TSH  3. GAD (generalized anxiety disorder) - desvenlafaxine  (PRISTIQ ) 100 MG 24 hr tablet; Take 1 tablet (100 mg total) by mouth daily.  Dispense: 90 tablet; Refill: 1 - CMP14+EGFR  4. Current smoker - CMP14+EGFR  5. Substance induced mood disorder (HCC) - CMP14+EGFR  6. Cervical radiculopathy -  CMP14+EGFR  7. Alcohol abuse, in remission  - CMP14+EGFR - Anemia Profile B  8. Annual physical exam (Primary) - CMP14+EGFR - Lipid panel - TSH - Anemia Profile B  9. Colon cancer screening  - Ambulatory referral to Gastroenterology   Will change Lexapro  20 mg to Pristiq  100 mg  Pt will take Lexapro  10 mg for 5 days with Pristiq  100 mg then stop lexapro .  Stress management Continue to abstain from alcohol  Referral to GI pending  Health Maintenance reviewed Diet and exercise encouraged  Follow up plan: 2 months  Bari Learn, FNP

## 2024-04-04 NOTE — Telephone Encounter (Signed)
 Pharmacy Patient Advocate Encounter  Received notification from [facility] that Prior Authorization for  Desvenlafaxine  Succinate ER 100MG  er tablets  has been APPROVED from 03/05/24 to 04/04/25   PA #/Case ID/Reference #: [id number]

## 2024-04-04 NOTE — Telephone Encounter (Signed)
 Pharmacy Patient Advocate Encounter   Received notification from CoverMyMeds that prior authorization for Desvenlafaxine  Succinate ER 100MG  er tablets is required/requested.   Insurance verification completed.   The patient is insured through [employer] .   Per test claim: PA required; PA submitted to above mentioned insurance via CoverMyMeds Key/confirmation #/EOC [id number] Status is pending

## 2024-04-05 LAB — CMP14+EGFR
ALT: 41 IU/L — ABNORMAL HIGH (ref 0–32)
AST: 51 IU/L — ABNORMAL HIGH (ref 0–40)
Albumin: 4.8 g/dL (ref 3.9–4.9)
Alkaline Phosphatase: 67 IU/L (ref 44–121)
BUN/Creatinine Ratio: 8 — ABNORMAL LOW (ref 9–23)
BUN: 6 mg/dL (ref 6–24)
Bilirubin Total: 0.3 mg/dL (ref 0.0–1.2)
CO2: 19 mmol/L — ABNORMAL LOW (ref 20–29)
Calcium: 9.6 mg/dL (ref 8.7–10.2)
Chloride: 102 mmol/L (ref 96–106)
Creatinine, Ser: 0.74 mg/dL (ref 0.57–1.00)
Globulin, Total: 2.4 g/dL (ref 1.5–4.5)
Glucose: 77 mg/dL (ref 70–99)
Potassium: 3.7 mmol/L (ref 3.5–5.2)
Sodium: 138 mmol/L (ref 134–144)
Total Protein: 7.2 g/dL (ref 6.0–8.5)
eGFR: 101 mL/min/1.73 (ref >59)

## 2024-04-05 LAB — ANEMIA PROFILE B
Basophils Absolute: 0.1 x10E3/uL (ref 0.0–0.2)
Basos: 1 %
EOS (ABSOLUTE): 0.2 x10E3/uL (ref 0.0–0.4)
Eos: 4 %
Ferritin: 73 ng/mL (ref 15–150)
Folate: 6.8 ng/mL (ref >3.0)
Hematocrit: 40.2 % (ref 34.0–46.6)
Hemoglobin: 12.8 g/dL (ref 11.1–15.9)
Immature Grans (Abs): 0 x10E3/uL (ref 0.0–0.1)
Immature Granulocytes: 0 %
Iron Saturation: 17 % (ref 15–55)
Iron: 58 ug/dL (ref 27–159)
Lymphocytes Absolute: 2.4 x10E3/uL (ref 0.7–3.1)
Lymphs: 39 %
MCH: 29.6 pg (ref 26.6–33.0)
MCHC: 31.8 g/dL (ref 31.5–35.7)
MCV: 93 fL (ref 79–97)
Monocytes Absolute: 0.6 x10E3/uL (ref 0.1–0.9)
Monocytes: 10 %
Neutrophils Absolute: 2.8 x10E3/uL (ref 1.4–7.0)
Neutrophils: 46 %
Platelets: 153 x10E3/uL (ref 150–450)
RBC: 4.32 x10E6/uL (ref 3.77–5.28)
RDW: 15.1 % (ref 11.7–15.4)
Retic Ct Pct: 1.4 % (ref 0.6–2.6)
Total Iron Binding Capacity: 340 ug/dL (ref 250–450)
UIBC: 282 ug/dL (ref 131–425)
Vitamin B-12: 687 pg/mL (ref 232–1245)
WBC: 6 x10E3/uL (ref 3.4–10.8)

## 2024-04-05 LAB — LIPID PANEL
Chol/HDL Ratio: 1.8 ratio (ref 0.0–4.4)
Cholesterol, Total: 182 mg/dL (ref 100–199)
HDL: 103 mg/dL (ref >39)
LDL Chol Calc (NIH): 66 mg/dL (ref 0–99)
Triglycerides: 73 mg/dL (ref 0–149)
VLDL Cholesterol Cal: 13 mg/dL (ref 5–40)

## 2024-04-05 LAB — TSH: TSH: 1.82 u[IU]/mL (ref 0.450–4.500)

## 2024-04-07 ENCOUNTER — Ambulatory Visit: Payer: Self-pay | Admitting: Family

## 2024-04-18 ENCOUNTER — Ambulatory Visit: Admitting: Family

## 2024-04-18 ENCOUNTER — Encounter: Admitting: Family

## 2024-05-01 ENCOUNTER — Ambulatory Visit

## 2024-05-01 NOTE — Progress Notes (Signed)
 Pt walked in today complaining of SOB and elevated b/p at work last night. Pt states SOB for 2-3 with b/p of 156/109 at work last night. Pt denies any other symptoms. Advised pt to monitor b/p at home and if it becomes elevated again or if she develops any symptoms to go to ER for evaluation. Pt also advised to keep follow up with [facility] for next month. Pt verbalized understanding.  B/P 127/87 HR 114 - pt states she has hx heart palpitations and is going to see her cardiologist after she leaves here. O2 98

## 2024-06-04 ENCOUNTER — Encounter: Payer: Self-pay | Admitting: Family

## 2024-06-13 ENCOUNTER — Telehealth: Payer: Self-pay | Admitting: Family

## 2024-06-13 ENCOUNTER — Encounter: Payer: Self-pay | Admitting: Family

## 2024-06-13 ENCOUNTER — Ambulatory Visit (INDEPENDENT_AMBULATORY_CARE_PROVIDER_SITE_OTHER): Admitting: Family

## 2024-06-13 VITALS — BP 127/81 | HR 80 | Temp 97.6°F | Ht 65.5 in | Wt 122.2 lb

## 2024-06-13 DIAGNOSIS — F17200 Nicotine dependence, unspecified, uncomplicated: Secondary | ICD-10-CM

## 2024-06-13 DIAGNOSIS — F332 Major depressive disorder, recurrent severe without psychotic features: Secondary | ICD-10-CM

## 2024-06-13 DIAGNOSIS — F1010 Alcohol abuse, uncomplicated: Secondary | ICD-10-CM

## 2024-06-13 DIAGNOSIS — F1994 Other psychoactive substance use, unspecified with psychoactive substance-induced mood disorder: Secondary | ICD-10-CM

## 2024-06-13 DIAGNOSIS — F411 Generalized anxiety disorder: Secondary | ICD-10-CM

## 2024-06-13 DIAGNOSIS — I10 Essential (primary) hypertension: Secondary | ICD-10-CM

## 2024-06-13 DIAGNOSIS — F5104 Psychophysiologic insomnia: Secondary | ICD-10-CM | POA: Diagnosis not present

## 2024-06-13 DIAGNOSIS — Z1159 Encounter for screening for other viral diseases: Secondary | ICD-10-CM

## 2024-06-13 DIAGNOSIS — Z1211 Encounter for screening for malignant neoplasm of colon: Secondary | ICD-10-CM

## 2024-06-13 DIAGNOSIS — I471 Supraventricular tachycardia, unspecified: Secondary | ICD-10-CM

## 2024-06-13 DIAGNOSIS — Z0279 Encounter for issue of other medical certificate: Secondary | ICD-10-CM

## 2024-06-13 NOTE — Addendum Note (Signed)
 Addended by: LAVELL LYE A on: 06/13/2024 09:01 AM   Modules accepted: Level of Service

## 2024-06-13 NOTE — Telephone Encounter (Signed)
 Brandy Vega dropped off FMLA  forms to be completed and signed.  Form Fee Paid? (Y/N)   Y         If NO, form is placed on front office manager desk to hold until payment received. If YES, then form will be placed in the RX/HH Nurse Coordinators box for completion.  Form will not be processed until payment is received

## 2024-06-13 NOTE — Patient Instructions (Signed)
Alcohol Misuse and Dependence Information, Adult Alcohol is a widely available drug and people choose to drink alcohol in different amounts. Alcohol misuse and dependence can have a negative effect on your life. Alcohol misuse is when you use alcohol too much or too often. You may have a hard time setting a limit on the amount you drink. Alcohol dependence is when you use alcohol consistently for a period of time, and your body changes as a result. Alcohol dependence can make it hard for you to stop drinking because you may start to feel sick or different when you do not drink alcohol. These symptoms are known as withdrawal. People who drink alcohol very often and in large amounts, may develop what is called an alcohol use disorder. How can alcohol misuse and dependence affect me? Drinking too much can lead to addiction. You may feel like you need alcohol to function normally. You may drink alcohol before work in the morning, during the day, or as soon as you get home from work in the evening. These actions can result in: Poor work performance. Job loss. Financial problems. Car crashes or criminal charges from driving after drinking alcohol. Problems in your relationships with friends and family. Losing the trust and respect of coworkers, friends, and family. Drinking heavily over a long period of time can permanently damage your body and brain, and can cause lifelong health issues, such as: Damage to your liver or pancreas. Heart problems, high blood pressure, or stroke. Certain cancers. Decreased ability to fight infections. Brain or nerve damage. Depression. Early death, also called premature death. If you are careless or you crave alcohol, it is easy to drink more than your body can handle (overdose). Alcohol overdose is a serious situation that requires hospitalization. It may lead to permanent injuries or death. What can increase my risk? Having a family history of alcohol misuse. Having  depression or other mental health conditions. Beginning to drink at an early age. Binge drinking often. Experiencing trauma, stress, and an unstable home life during childhood. Spending time with people who drink often. What actions can I take to prevent alcohol misuse and dependence? Do not drink alcohol if: Your health care provider tells you not to drink. You are pregnant, may be pregnant, or are planning to become pregnant. If you drink alcohol: Limit how much you have to: 0-1 drink a day for women who are not pregnant. 0-2 drinks a day for men. Know how much alcohol is in your drink. In the [place] one drink equals one 12 oz bottle of beer (355 mL), one 5 oz glass of wine (148 mL), or one 1 oz glass of hard liquor (44 mL). If you think you have an alcohol dependency problem, decide to stop drinking. This can be very hard to do if you are used to frequently drinking alcohol. If you begin to have withdrawal symptoms, talk with your health care provider or a person that you trust. These symptoms may include anxiety, shaky hands, headache, nausea, sweating, or not being able to sleep. Choose to drink nonalcoholic beverages in social gatherings and places where there may be alcohol. Activity Spend more time on activities that you enjoy that do not involve alcohol, like hobbies or exercise. Find healthy ways to cope with stress, such as meditation or spending time with people you care about. General information Talk to your family, coworkers, and friends about supporting you in your efforts to stop drinking. If they drink, ask them not to drink  around you. Spend more time with people who do not drink alcohol. If you think that you have an alcohol dependency problem: Tell friends or family about your concerns. Talk with your health care provider or another health professional about where to get help. Work with a Paramedic and a Network engineer. Consider joining a support group  for people who struggle with alcohol misuse and dependence. Where to find support  Your health care provider. SMART Recovery: smartrecovery.org Local treatment centers or chemical dependency counselors. Local AA groups in your community: CustomizedRugs.fi Where to find more information Centers for Disease Control and Prevention: TonerPromos.no [employer] on Alcohol Abuse and Alcoholism: BackupSupply.hu Alcoholics Anonymous (AA): CustomizedRugs.fi Contact a health care provider if: You drank more or for longer than you intended on more than one occasion. You often drink to the point of vomiting or passing out. You have problems in your life due to drinking, but you continue to drink. You keep drinking even though you feel anxious, depressed, or have experienced memory loss. You have stopped doing the things you used to enjoy in order to drink. You have to drink more than you used to in order to get the effect you want. You experience anxiety, sweating, nausea, shakiness, and trouble sleeping when you try to stop drinking. Get help right away if: You have serious withdrawal symptoms, including: Confusion. Racing heart. High blood pressure. Fever. These symptoms may be an emergency. Get help right away. Call 911. Do not wait to see if the symptoms will go away. Do not drive yourself to the hospital. Also, get help right away if: You have thoughts about hurting yourself or others. Take one of these steps if you feel like you may hurt yourself or others, or have thoughts about taking your own life: Call 911. Call the National Suicide Prevention Lifeline at 703-677-7233 or 988. This is open 24 hours a day. Text the Crisis Text Line at (563)032-9389. Summary Alcohol misuse and dependence can have a negative effect on your life. Drinking too much or too often can lead to addiction. If you drink alcohol, limit how much you use. If you are having trouble keeping your drinking under control, find ways to change your  behavior. Hobbies, calming activities, exercise, or support groups can help. If you feel you need help with changing your drinking habits, talk with your health care provider, a good friend, or a therapist, or go to a support group. This information is not intended to replace advice given to you by your health care provider. Make sure you discuss any questions you have with your health care provider. Document Revised: 10/19/2021 Document Reviewed: 10/19/2021 Elsevier Patient Education  2024 [employer].

## 2024-06-13 NOTE — Progress Notes (Signed)
 Subjective:    Patient ID: Brandy Vega, female    DOB: 1975-12-29, 48 y.o.   MRN: [id number]  Chief Complaint  Patient presents with   Follow-up   PT presents to the office today for chronic follow up.    She was hospitalized for SI on 07/23/22 after drinking binge. She reports her father passed away several years ago, but that time was around his birthday and holidays.    She has restarted drinking, states several beers a few days a week. Has been to rehab in the past.   She has hx of SVT and was recommend for ablation. She was too anxious and never had this done. She is followed by Cardiologists.   She has allergies and takes zyrtec  as needed.  Hypertension This is a chronic problem. The current episode started more than 1 year ago. The problem has been resolved since onset. The problem is controlled. Associated symptoms include anxiety and palpitations. Pertinent negatives include no malaise/fatigue, peripheral edema or shortness of breath. Risk factors for coronary artery disease include sedentary lifestyle, smoking/tobacco exposure and dyslipidemia. The current treatment provides moderate improvement.  Depression        This is a chronic problem.  The current episode started more than 1 year ago.   The problem occurs intermittently.  Associated symptoms include fatigue, helplessness, hopelessness and sad.  Associated symptoms include no suicidal ideas.  Past treatments include SNRIs - Serotonin and norepinephrine reuptake inhibitors.  Past medical history includes anxiety.   Anxiety Presents for follow-up visit. Symptoms include excessive worry, irritability, nervous/anxious behavior and palpitations. Patient reports no shortness of breath or suicidal ideas. Symptoms occur most days. The severity of symptoms is moderate.        Review of Systems  Constitutional:  Positive for fatigue and irritability. Negative for malaise/fatigue.  Respiratory:  Negative for shortness of  breath.   Cardiovascular:  Positive for palpitations.  Psychiatric/Behavioral:  Negative for suicidal ideas. The patient is nervous/anxious.   All other systems reviewed and are negative.  Family History  Problem Relation Age of Onset   Hypertension Mother    Diabetes Father    Social History   Socioeconomic History   Marital status: Single    Spouse name: Not on file   Number of children: Not on file   Years of education: Not on file   Highest education level: Not on file  Occupational History   Not on file  Tobacco Use   Smoking status: Every Day    Current packs/day: 0.50    Types: Cigarettes   Smokeless tobacco: Never  Vaping Use   Vaping status: Never Used  Substance and Sexual Activity   Alcohol use: Yes    Comment: occ. use   Drug use: No   Sexual activity: Yes    Birth control/protection: None  Other Topics Concern   Not on file  Social History Narrative   Lives with boyfriend.     Social Drivers of Corporate investment banker Strain: Not on file  Food Insecurity: No Food Insecurity (05/01/2024)   [facility]   Hunger Vital Sign    Within the past 12 months, you worried that your food would run out before you got the money to buy more.: Never true    Within the past 12 months, the food you bought just didn't last and you didn't have money to get more.: Never true  Transportation Needs: No Transportation Needs (05/01/2024)  Received from [facility] - Transportation    Lack of Transportation (Medical): No    Lack of Transportation (Non-Medical): No  Physical Activity: Not on file  Stress: Not on file  Social Connections: Not on file       Objective:   Physical Exam Vitals reviewed.  Constitutional:      General: She is not in acute distress.    Appearance: She is well-developed.  HENT:     Head: Normocephalic and atraumatic.     Right Ear: Tympanic membrane normal.     Left Ear: Tympanic membrane normal.  Eyes:      Pupils: Pupils are equal, round, and reactive to light.  Neck:     Thyroid: No thyromegaly.  Cardiovascular:     Rate and Rhythm: Normal rate and regular rhythm.     Heart sounds: Normal heart sounds. No murmur heard. Pulmonary:     Effort: Pulmonary effort is normal. No respiratory distress.     Breath sounds: Normal breath sounds. No wheezing.  Abdominal:     General: Bowel sounds are normal. There is no distension.     Palpations: Abdomen is soft.     Tenderness: There is no abdominal tenderness.  Musculoskeletal:        General: No tenderness. Normal range of motion.     Cervical back: Normal range of motion and neck supple.  Skin:    General: Skin is warm and dry.  Neurological:     Mental Status: She is alert and oriented to person, place, and time.     Cranial Nerves: No cranial nerve deficit.     Deep Tendon Reflexes: Reflexes are normal and symmetric.  Psychiatric:        Mood and Affect: Mood is anxious. Affect is tearful.        Behavior: Behavior normal.        Thought Content: Thought content normal.        Judgment: Judgment normal.     Comments: Crying        BP 127/81   Pulse 80   Temp 97.6 F (36.4 C) (Temporal)   Ht 5' 5.5 (1.664 m)   Wt 122 lb 3.2 oz (55.4 kg)   BMI 20.03 kg/m      Assessment & Plan:  Brandy Vega comes in today with chief complaint of Follow-up   Diagnosis and orders addressed:  1. Alcohol abuse (Primary) - CMP14+EGFR - Ambulatory referral to Psychiatry  2. Chronic insomnia - CMP14+EGFR - Ambulatory referral to Psychiatry  3. Current smoker - CMP14+EGFR  4. Essential hypertension - CMP14+EGFR  5. GAD (generalized anxiety disorder) - CMP14+EGFR - Ambulatory referral to Psychiatry  6. Severe episode of recurrent major depressive disorder, without psychotic features (HCC)  - CMP14+EGFR - Ambulatory referral to Psychiatry  7. Substance induced mood disorder (HCC) - CMP14+EGFR - Ambulatory referral to  Psychiatry  8. SVT (supraventricular tachycardia) - CMP14+EGFR  9. Need for hepatitis B screening test - CMP14+EGFR - Hepatitis B surface antibody,quantitative    Stress management Discussed getting back on track and avoiding alcohol  Referral to GI pending  Health Maintenance reviewed Diet and exercise encouraged  Follow up plan: 3 months    Bari Learn, FNP

## 2024-06-14 LAB — CMP14+EGFR
ALT: 22 IU/L (ref 0–32)
AST: 45 IU/L — ABNORMAL HIGH (ref 0–40)
Albumin: 4.6 g/dL (ref 3.9–4.9)
Alkaline Phosphatase: 75 IU/L (ref 41–116)
BUN/Creatinine Ratio: 9 (ref 9–23)
BUN: 7 mg/dL (ref 6–24)
Bilirubin Total: 0.3 mg/dL (ref 0.0–1.2)
CO2: 19 mmol/L — ABNORMAL LOW (ref 20–29)
Calcium: 9.7 mg/dL (ref 8.7–10.2)
Chloride: 103 mmol/L (ref 96–106)
Creatinine, Ser: 0.75 mg/dL (ref 0.57–1.00)
Globulin, Total: 2.6 g/dL (ref 1.5–4.5)
Glucose: 82 mg/dL (ref 70–99)
Potassium: 3.4 mmol/L — ABNORMAL LOW (ref 3.5–5.2)
Sodium: 138 mmol/L (ref 134–144)
Total Protein: 7.2 g/dL (ref 6.0–8.5)
eGFR: 99 mL/min/1.73 (ref >59)

## 2024-06-14 LAB — HEPATITIS B SURFACE ANTIBODY, QUANTITATIVE: Hepatitis B Surf Ab Quant: <3.5 m[IU]/mL — AB

## 2024-06-16 ENCOUNTER — Ambulatory Visit: Payer: Self-pay | Admitting: Family

## 2024-06-17 NOTE — Telephone Encounter (Signed)
 PCP completed and signed FMLA forms. They have been faxed to [facility] at fax number [phone]. Patient has been contacted and informed they are complete. Copy at front desk.

## 2024-07-01 ENCOUNTER — Emergency Department (HOSPITAL_COMMUNITY)

## 2024-07-01 ENCOUNTER — Other Ambulatory Visit: Payer: Self-pay

## 2024-07-01 ENCOUNTER — Emergency Department (HOSPITAL_COMMUNITY)
Admission: EM | Admit: 2024-07-01 | Discharge: 2024-07-01 | Disposition: A | Discharge status: Home / Self Care | Source: Other Acute Inpatient Hospital | Attending: Emergency Medicine | Admitting: Emergency Medicine

## 2024-07-01 DIAGNOSIS — R42 Dizziness and giddiness: Secondary | ICD-10-CM | POA: Diagnosis not present

## 2024-07-01 DIAGNOSIS — R000 Tachycardia, unspecified: Secondary | ICD-10-CM | POA: Diagnosis not present

## 2024-07-01 DIAGNOSIS — Y908 Blood alcohol level of 240 mg/100 ml or more: Secondary | ICD-10-CM | POA: Diagnosis not present

## 2024-07-01 DIAGNOSIS — J439 Emphysema, unspecified: Secondary | ICD-10-CM

## 2024-07-01 DIAGNOSIS — R55 Syncope and collapse: Secondary | ICD-10-CM | POA: Diagnosis present

## 2024-07-01 LAB — CBC WITH DIFFERENTIAL/PLATELET
Abs Immature Granulocytes: 0.02 K/uL (ref 0.00–0.07)
Basophils Absolute: 0.1 K/uL (ref 0.0–0.1)
Basophils Relative: 1 %
Eosinophils Absolute: 0 K/uL (ref 0.0–0.5)
Eosinophils Relative: 0 %
HCT: 35.3 % — ABNORMAL LOW (ref 36.0–46.0)
Hemoglobin: 12.1 g/dL (ref 12.0–15.0)
Immature Granulocytes: 0 %
Lymphocytes Relative: 27 %
Lymphs Abs: 1.4 K/uL (ref 0.7–4.0)
MCH: 31.4 pg (ref 26.0–34.0)
MCHC: 34.3 g/dL (ref 30.0–36.0)
MCV: 91.7 fL (ref 80.0–100.0)
Monocytes Absolute: 0.5 K/uL (ref 0.1–1.0)
Monocytes Relative: 9 %
Neutro Abs: 3.3 K/uL (ref 1.7–7.7)
Neutrophils Relative %: 63 %
Platelets: 152 K/uL (ref 150–400)
RBC: 3.85 MIL/uL — ABNORMAL LOW (ref 3.87–5.11)
RDW: 13.6 % (ref 11.5–15.5)
WBC: 5.2 K/uL (ref 4.0–10.5)
nRBC: 0 % (ref 0.0–0.2)

## 2024-07-01 LAB — COMPREHENSIVE METABOLIC PANEL WITH GFR
ALT: 62 U/L — ABNORMAL HIGH (ref 0–44)
AST: 165 U/L — ABNORMAL HIGH (ref 15–41)
Albumin: 4.3 g/dL (ref 3.5–5.0)
Alkaline Phosphatase: 65 U/L (ref 38–126)
Anion gap: 14 (ref 5–15)
BUN: 7 mg/dL (ref 6–20)
CO2: 21 mmol/L — ABNORMAL LOW (ref 22–32)
Calcium: 8.2 mg/dL — ABNORMAL LOW (ref 8.9–10.3)
Chloride: 106 mmol/L (ref 98–111)
Creatinine, Ser: 0.61 mg/dL (ref 0.44–1.00)
GFR, Estimated: >60 mL/min (ref >60)
Glucose, Bld: 92 mg/dL (ref 70–99)
Potassium: 3.5 mmol/L (ref 3.5–5.1)
Sodium: 142 mmol/L (ref 135–145)
Total Bilirubin: 0.5 mg/dL (ref 0.0–1.2)
Total Protein: 6.9 g/dL (ref 6.5–8.1)

## 2024-07-01 LAB — MAGNESIUM: Magnesium: 2.1 mg/dL (ref 1.7–2.4)

## 2024-07-01 LAB — ETHANOL: Alcohol, Ethyl (B): 287 mg/dL — ABNORMAL HIGH (ref <15)

## 2024-07-01 MED ORDER — IOHEXOL 350 MG/ML SOLN
75.0000 mL | Freq: Once | INTRAVENOUS | Status: AC | PRN
  Administered 2024-07-01: 75 mL via INTRAVENOUS

## 2024-07-01 MED ORDER — LACTATED RINGERS IV BOLUS
1000.0000 mL | Freq: Once | INTRAVENOUS | Status: AC
  Administered 2024-07-01: 1000 mL via INTRAVENOUS

## 2024-07-01 NOTE — ED Provider Notes (Signed)
 Care of patient assumed from Dr. Elige.  This patient presented after a questionable syncopal event at urgent care.  Recent history includes patient describing a sensation of a pop in the back of her head without any associated pain.  She has had occipital head pressure.  She has been recently drinking heavily.  She had shaking at work overnight and was sent home.  When she got home, she drank alcohol.  She went to urgent care, where there was a concern of a syncopal episode.  She is awaiting CTA of head and neck.  Discharge is anticipated. Physical Exam  BP (!) 148/90   Pulse 66   Temp 98.2 F (36.8 C) (Oral)   Resp 15   SpO2 95%   Physical Exam Vitals and nursing note reviewed.  Constitutional:      General: She is not in acute distress.    Appearance: Normal appearance. She is well-developed. She is not ill-appearing, toxic-appearing or diaphoretic.  HENT:     Head: Normocephalic and atraumatic.     Right Ear: External ear normal.     Left Ear: External ear normal.     Nose: Nose normal.     Mouth/Throat:     Mouth: Mucous membranes are moist.  Eyes:     Extraocular Movements: Extraocular movements intact.     Conjunctiva/sclera: Conjunctivae normal.  Cardiovascular:     Rate and Rhythm: Normal rate and regular rhythm.  Pulmonary:     Effort: Pulmonary effort is normal. No respiratory distress.  Abdominal:     General: There is no distension.     Palpations: Abdomen is soft.     Tenderness: There is no abdominal tenderness.  Musculoskeletal:        General: No swelling. Normal range of motion.     Cervical back: Normal range of motion and neck supple.  Skin:    General: Skin is warm and dry.     Coloration: Skin is not jaundiced or pale.  Neurological:     General: No focal deficit present.     Mental Status: She is alert and oriented to person, place, and time.  Psychiatric:        Mood and Affect: Mood normal.        Behavior: Behavior normal.     Procedures   Procedures  ED Course / MDM    Medical Decision Making Amount and/or Complexity of Data Reviewed Labs: ordered. Radiology: ordered.  Risk Prescription drug management.   On assessment, patient is sleeping.  She is easily awakened.  She is able to sit up and drink water.  CTA does not show any acute findings.  She was able to ambulate.  She does continue to appear mildly intoxicated.  Patient's mother arrived to bring her home.  She was discharged in stable condition.       Melvenia Motto, MD 07/01/24 1730

## 2024-07-01 NOTE — ED Provider Notes (Signed)
 [place] EMERGENCY DEPARTMENT AT [facility] Provider Note   CSN: [id number] Arrival date & time: 07/01/24  1105     Patient presents with: Loss of Consciousness   Brandy Vega is a 48 y.o. female.   HPI 48 year old female sent over from urgent care, reportedly for syncope.  History is very limited as the note from urgent care is limited and the patient is a poor historian.  The patient tells me that last night before work (works third shift) she felt a pop in the back of her head.  Had some head pressure but no severe pain.  Has had occipital head pressure ever since.  Went to work and was having some shaking that she describes as being unable to control her arms/tremors.  Was sent home from work early.  She at some point either before going back to bed or since waking up drank liquor this morning.  At some point she went to urgent care and reportedly had some sort of syncopal episode and EMS was called and brought her here.  The patient denies any chest pain or shortness of breath or vomiting.  No focal weakness or numbness.  She is not a good historian.  Prior to Admission medications   Medication Sig Start Date End Date Taking? Authorizing Provider  albuterol  (VENTOLIN  HFA) 108 (90 Base) MCG/ACT inhaler Inhale 2 puffs into the lungs every 4 (four) hours as needed for wheezing or shortness of breath. 05/12/22   Vicky Charleston, PA-C  amLODipine  (NORVASC ) 5 MG tablet Take 1 tablet (5 mg total) by mouth daily. 10/04/23   Lavell Lye A, FNP  buPROPion  (WELLBUTRIN  XL) 300 MG 24 hr tablet Take 1 tablet (300 mg total) by mouth daily. 08/10/23   Lavell Lye A, FNP  busPIRone  (BUSPAR ) 7.5 MG tablet Take 1 tablet (7.5 mg total) by mouth 3 (three) times daily as needed. 11/03/22   Lavell Lye A, FNP  cetirizine  (ZYRTEC  ALLERGY) 10 MG tablet Take 1 tablet (10 mg total) by mouth daily. 09/14/23   Lavell Lye A, FNP  desvenlafaxine  (PRISTIQ ) 100 MG 24 hr tablet Take 1 tablet (100  mg total) by mouth daily. 04/04/24   Lavell Lye LABOR, FNP  fluticasone  (FLONASE ) 50 MCG/ACT nasal spray Place 2 sprays into both nostrils daily. 09/14/23   Lavell Lye LABOR, FNP  gabapentin  (NEURONTIN ) 300 MG capsule Take 1 capsule (300 mg total) by mouth 3 (three) times daily. 03/26/23   Lavell Lye LABOR, FNP  ibuprofen  (ADVIL ) 800 MG tablet Take 1 tablet (800 mg total) by mouth every 8 (eight) hours as needed for moderate pain (pain score 4-6). 08/10/23   Lavell Lye A, FNP  Melatonin 10 MG TABS Take 10 mg by mouth daily as needed. Patient taking differently: Take 10 mg by mouth daily as needed (sleep). 02/07/22   Lavell Lye LABOR, FNP  ondansetron  (ZOFRAN ) 4 MG tablet Take 1 tablet (4 mg total) by mouth every 8 (eight) hours as needed for nausea or vomiting. 09/14/23   Lavell Lye LABOR, FNP    Allergies: Patient has no known allergies.    Review of Systems  Constitutional:  Negative for fever.  Respiratory:  Negative for shortness of breath.   Cardiovascular:  Negative for chest pain.  Gastrointestinal:  Negative for vomiting.  Musculoskeletal:  Negative for neck pain.  Neurological:  Positive for headaches.    Updated Vital Signs BP (!) 148/90   Pulse 66   Temp 98.2 F (36.8 C) (Oral)  Resp 15   SpO2 95%   Physical Exam Vitals and nursing note reviewed.  Constitutional:      General: She is not in acute distress.    Appearance: She is well-developed. She is not ill-appearing or diaphoretic.     Comments: Appears intoxicated.  HENT:     Head: Normocephalic and atraumatic.     Comments: No scalp swelling/mass to her occiput. No tenderness. Eyes:     Extraocular Movements: Extraocular movements intact.  Cardiovascular:     Rate and Rhythm: Regular rhythm. Tachycardia present.     Heart sounds: Normal heart sounds.     Comments: HR~100 Pulmonary:     Effort: Pulmonary effort is normal.     Breath sounds: Normal breath sounds.  Abdominal:     General: There is no  distension.     Palpations: Abdomen is soft.     Tenderness: There is no abdominal tenderness.  Musculoskeletal:     Cervical back: Normal range of motion. No rigidity.  Skin:    General: Skin is warm and dry.  Neurological:     Mental Status: She is alert and oriented to person, place, and time.     Comments: CN 3-12 grossly intact. 5/5 strength in all 4 extremities. Grossly normal sensation. Normal finger to nose.      (all labs ordered are listed, but only abnormal results are displayed) Labs Reviewed  COMPREHENSIVE METABOLIC PANEL WITH GFR - Abnormal; Notable for the following components:      Result Value   CO2 21 (*)    Calcium 8.2 (*)    AST 165 (*)    ALT 62 (*)    All other components within normal limits  ETHANOL - Abnormal; Notable for the following components:   Alcohol, Ethyl (B) 287 (*)    All other components within normal limits  CBC WITH DIFFERENTIAL/PLATELET - Abnormal; Notable for the following components:   RBC 3.85 (*)    HCT 35.3 (*)    All other components within normal limits  MAGNESIUM     EKG: EKG Interpretation Date/Time:  Tuesday July 01 2024 12:44:27 EST Ventricular Rate:  91 PR Interval:  119 QRS Duration:  105 QT Interval:  409 QTC Calculation: 504 R Axis:   74  Text Interpretation: Sinus rhythm Ventricular premature complex Borderline short PR interval Anteroseptal infarct, age indeterminate rate is faster, otherwise no significant change since 2024 Confirmed by Freddi Hamilton [phone]) on 07/01/2024 1:58:05 PM  Radiology: No results found.   Procedures   Medications Ordered in the ED  iohexol (OMNIPAQUE) 350 MG/ML injection 75 mL (has no administration in time range)  lactated ringers bolus 1,000 mL (1,000 mLs Intravenous New Bag/Given 07/01/24 1240)                                    Medical Decision Making Amount and/or Complexity of Data Reviewed Labs: ordered.    Details: Elevated alcohol level Radiology:  ordered. ECG/medicine tests: ordered and independent interpretation performed.    Details: No ischemia  Risk Prescription drug management.   Patient presents with alcohol intoxication.  She reports syncope, unclear if this was syncope versus seizure.  Given her complaint of occipital headache, CTA has been ordered.  She otherwise has an unremarkable exam. No meningismus. Low suspicion for acute infection. Care transferred to Dr. Melvenia.     Final diagnoses:  None    ED  Discharge Orders     None          Freddi Hamilton, MD 07/01/24 [phone]

## 2024-07-01 NOTE — ED Notes (Signed)
 Ambulated pt with minimal assistance, pt denies any dizziness or shob

## 2024-07-01 NOTE — ED Triage Notes (Signed)
 Pt arrived via [facility] from [facility] c/o syncope per the PA at [facility], pt recently relapsed on ETOH and stated she did drink 3 airplane bottles of alcohol today. No Hx of seizures

## 2024-07-01 NOTE — Discharge Instructions (Signed)
 Your test results today are reassuring.  Drink fluids at home to stay hydrated.  Minimize use of alcohol.  Return to the emergency department for any new or worsening symptoms of concern.

## 2024-09-19 ENCOUNTER — Ambulatory Visit: Payer: Self-pay | Admitting: Family

## 2024-09-29 ENCOUNTER — Encounter: Payer: Self-pay | Admitting: Family

## 2024-09-29 ENCOUNTER — Ambulatory Visit: Admitting: Family

## 2024-09-29 DIAGNOSIS — I10 Essential (primary) hypertension: Secondary | ICD-10-CM

## 2024-09-29 DIAGNOSIS — S0990XA Unspecified injury of head, initial encounter: Secondary | ICD-10-CM | POA: Diagnosis not present

## 2024-09-29 DIAGNOSIS — F5104 Psychophysiologic insomnia: Secondary | ICD-10-CM

## 2024-09-29 DIAGNOSIS — R002 Palpitations: Secondary | ICD-10-CM

## 2024-09-29 DIAGNOSIS — T7491XA Unspecified adult maltreatment, confirmed, initial encounter: Secondary | ICD-10-CM | POA: Diagnosis not present

## 2024-09-29 DIAGNOSIS — F411 Generalized anxiety disorder: Secondary | ICD-10-CM

## 2024-09-29 DIAGNOSIS — F332 Major depressive disorder, recurrent severe without psychotic features: Secondary | ICD-10-CM

## 2024-09-29 DIAGNOSIS — F17200 Nicotine dependence, unspecified, uncomplicated: Secondary | ICD-10-CM

## 2024-09-29 DIAGNOSIS — F1010 Alcohol abuse, uncomplicated: Secondary | ICD-10-CM

## 2024-10-10 ENCOUNTER — Ambulatory Visit: Admitting: Family
